# Patient Record
Sex: Male | Born: 1955 | Race: Black or African American | Hispanic: No | Marital: Single | Smoking: Never smoker
Health system: Southern US, Community
[De-identification: ages and names within clinical notes are randomized; demographics above are authoritative.]

## PROBLEM LIST (undated history)

## (undated) DIAGNOSIS — I10 Essential (primary) hypertension: Secondary | ICD-10-CM

## (undated) DIAGNOSIS — N183 Chronic kidney disease, stage 3 unspecified: Secondary | ICD-10-CM

## (undated) DIAGNOSIS — D649 Anemia, unspecified: Secondary | ICD-10-CM

## (undated) DIAGNOSIS — R112 Nausea with vomiting, unspecified: Secondary | ICD-10-CM

## (undated) DIAGNOSIS — Z9889 Other specified postprocedural states: Secondary | ICD-10-CM

## (undated) DIAGNOSIS — C49A0 Gastrointestinal stromal tumor, unspecified site: Secondary | ICD-10-CM

## (undated) DIAGNOSIS — N022 Recurrent and persistent hematuria with diffuse membranous glomerulonephritis: Secondary | ICD-10-CM

## (undated) HISTORY — DX: Essential (primary) hypertension: I10

## (undated) HISTORY — DX: Recurrent and persistent hematuria with diffuse membranous glomerulonephritis: N02.2

## (undated) HISTORY — PX: SURGICAL EXCISION OF EXCESSIVE SKIN: SHX6542

## (undated) HISTORY — PX: COLONOSCOPY: SHX174

---
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
## 1998-08-15 ENCOUNTER — Emergency Department (HOSPITAL_COMMUNITY): Admission: EM | Admit: 1998-08-15 | Discharge: 1998-08-15 | Payer: Self-pay | Admitting: Emergency Medicine

## 2000-11-12 ENCOUNTER — Encounter: Payer: Self-pay | Admitting: Internal Medicine

## 2000-11-12 ENCOUNTER — Encounter: Admission: RE | Admit: 2000-11-12 | Discharge: 2000-11-12 | Payer: Self-pay | Admitting: Internal Medicine

## 2009-01-15 ENCOUNTER — Ambulatory Visit: Payer: Self-pay | Admitting: Internal Medicine

## 2009-08-20 ENCOUNTER — Ambulatory Visit: Payer: Self-pay | Admitting: Internal Medicine

## 2010-02-25 ENCOUNTER — Ambulatory Visit: Payer: Self-pay | Admitting: Internal Medicine

## 2010-03-17 ENCOUNTER — Ambulatory Visit: Payer: Self-pay | Admitting: Internal Medicine

## 2010-05-12 ENCOUNTER — Ambulatory Visit
Admission: RE | Admit: 2010-05-12 | Discharge: 2010-05-12 | Payer: Self-pay | Source: Home / Self Care | Attending: Internal Medicine | Admitting: Internal Medicine

## 2010-06-14 ENCOUNTER — Ambulatory Visit
Admission: RE | Admit: 2010-06-14 | Discharge: 2010-06-14 | Payer: Self-pay | Source: Home / Self Care | Attending: Internal Medicine | Admitting: Internal Medicine

## 2010-12-09 ENCOUNTER — Encounter: Payer: Self-pay | Admitting: Internal Medicine

## 2010-12-13 ENCOUNTER — Ambulatory Visit: Payer: Self-pay | Admitting: Internal Medicine

## 2011-01-25 ENCOUNTER — Other Ambulatory Visit: Payer: Self-pay | Admitting: Internal Medicine

## 2011-05-26 ENCOUNTER — Other Ambulatory Visit: Payer: Self-pay | Admitting: Internal Medicine

## 2011-12-05 ENCOUNTER — Other Ambulatory Visit: Payer: Self-pay | Admitting: Internal Medicine

## 2011-12-05 NOTE — Telephone Encounter (Signed)
Check last appt. Generally do just 6 month recheck and not a PE because of poor insurance coverage. Can do 30 days with one refill if no recent appt.

## 2012-01-28 ENCOUNTER — Other Ambulatory Visit: Payer: Self-pay | Admitting: Internal Medicine

## 2012-03-04 ENCOUNTER — Other Ambulatory Visit: Payer: Self-pay | Admitting: Internal Medicine

## 2012-03-04 NOTE — Telephone Encounter (Signed)
No appointment scheduled

## 2012-03-04 NOTE — Telephone Encounter (Signed)
?   OV soon?

## 2012-03-05 ENCOUNTER — Other Ambulatory Visit: Payer: Self-pay

## 2012-03-05 MED ORDER — LISINOPRIL-HYDROCHLOROTHIAZIDE 20-25 MG PO TABS: 1.0000 | ORAL_TABLET | Freq: Every day | ORAL | Status: DC

## 2012-03-14 ENCOUNTER — Encounter: Payer: Self-pay | Admitting: Internal Medicine

## 2012-03-14 ENCOUNTER — Ambulatory Visit (INDEPENDENT_AMBULATORY_CARE_PROVIDER_SITE_OTHER): Payer: Self-pay | Admitting: Internal Medicine

## 2012-03-14 VITALS — BP 134/90 | HR 76 | Temp 98.3°F | Wt 252.0 lb

## 2012-03-14 DIAGNOSIS — I10 Essential (primary) hypertension: Secondary | ICD-10-CM

## 2012-03-14 NOTE — Progress Notes (Signed)
  Subjective:    Patient ID: Larry Clements, male    DOB: 04/16/1956, 56 y.o.   MRN: [id number]  HPI 56 year old 56 male in today for recheck of hypertension. No complaints or problems. Does not usually take influenza vaccine in never gets the flu. Compliant with blood pressure medication. Blood pressure under good control on current regimen. No chest pain. No shortness of breath. 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 Agrees to come next week for fasting lab work.    Review of Systems     Objective:   Physical Exam neck is supple without JVD thyromegaly or carotid bruits. Chest clear to auscultation. Cardiac exam regular rate and rhythm normal S1 and S2. Shampoos without edema.         Assessment & Plan:  Hypertension  Hypertension  hypertension hypertension this will    Hypertension-reasonably good control on current regimen  Plan: Return for fasting lab work next week along with Tdap vaccine to be given at that time. Declines influenza immunization. Okay to refill antihypertensive medication for 6 months.

## 2012-03-14 NOTE — Patient Instructions (Addendum)
Return next week for fasting lab work and tetanus immunization update. Otherwise return in 6 months.

## 2012-03-21 ENCOUNTER — Other Ambulatory Visit: Payer: BC Managed Care – PPO | Admitting: Internal Medicine

## 2012-03-21 DIAGNOSIS — Z125 Encounter for screening for malignant neoplasm of prostate: Secondary | ICD-10-CM

## 2012-03-21 DIAGNOSIS — I10 Essential (primary) hypertension: Secondary | ICD-10-CM

## 2012-03-21 DIAGNOSIS — E785 Hyperlipidemia, unspecified: Secondary | ICD-10-CM

## 2012-03-21 LAB — COMPREHENSIVE METABOLIC PANEL
Albumin: 4.1 g/dL (ref 3.5–5.2)
Alkaline Phosphatase: 68 U/L (ref 39–117)
BUN: 24 mg/dL — ABNORMAL HIGH (ref 6–23)
CO2: 29 mEq/L (ref 19–32)
Glucose, Bld: 80 mg/dL (ref 70–99)
Sodium: 140 mEq/L (ref 135–145)
Total Bilirubin: 0.8 mg/dL (ref 0.3–1.2)
Total Protein: 7.3 g/dL (ref 6.0–8.3)

## 2012-03-21 LAB — CBC WITH DIFFERENTIAL/PLATELET
Basophils Relative: 1 % (ref 0–1)
Eosinophils Absolute: 0.3 10*3/uL (ref 0.0–0.7)
HCT: 40.4 % (ref 39.0–52.0)
Hemoglobin: 13.7 g/dL (ref 13.0–17.0)
Lymphs Abs: 1 10*3/uL (ref 0.7–4.0)
MCH: 27.6 pg (ref 26.0–34.0)
MCHC: 33.9 g/dL (ref 30.0–36.0)
MCV: 81.5 fL (ref 78.0–100.0)
Monocytes Absolute: 0.4 10*3/uL (ref 0.1–1.0)
Monocytes Relative: 10 % (ref 3–12)
Neutrophils Relative %: 58 % (ref 43–77)
RBC: 4.96 MIL/uL (ref 4.22–5.81)

## 2012-03-21 LAB — LIPID PANEL
Cholesterol: 138 mg/dL (ref 0–200)
HDL: 37 mg/dL — ABNORMAL LOW (ref >39)
Triglycerides: 90 mg/dL (ref <150)
VLDL: 18 mg/dL (ref 0–40)

## 2012-06-02 ENCOUNTER — Other Ambulatory Visit: Payer: Self-pay | Admitting: Internal Medicine

## 2012-07-04 ENCOUNTER — Other Ambulatory Visit: Payer: Self-pay | Admitting: Internal Medicine

## 2012-10-06 ENCOUNTER — Other Ambulatory Visit: Payer: Self-pay | Admitting: Internal Medicine

## 2012-11-11 ENCOUNTER — Telehealth: Payer: Self-pay | Admitting: Internal Medicine

## 2012-11-11 MED ORDER — LISINOPRIL-HYDROCHLOROTHIAZIDE 20-25 MG PO TABS: 1.0000 | ORAL_TABLET | Freq: Every day | ORAL | Status: DC

## 2012-11-11 NOTE — Telephone Encounter (Signed)
Needs PE October. Refill through October

## 2012-11-12 NOTE — Telephone Encounter (Signed)
Patient scheduled for CPE Labs 11/6 and CPE on 11/7 @ 1500.  Spoke with patient and he understands meds will be refilled until this appointment and then if appointment not kept, no refills.

## 2012-12-10 ENCOUNTER — Other Ambulatory Visit: Payer: Self-pay

## 2012-12-11 ENCOUNTER — Other Ambulatory Visit: Payer: Self-pay

## 2012-12-11 MED ORDER — LISINOPRIL-HYDROCHLOROTHIAZIDE 20-25 MG PO TABS: 1.0000 | ORAL_TABLET | Freq: Every day | ORAL | Status: DC

## 2012-12-12 ENCOUNTER — Other Ambulatory Visit: Payer: Self-pay

## 2013-03-12 ENCOUNTER — Other Ambulatory Visit: Payer: Self-pay | Admitting: Internal Medicine

## 2013-03-20 ENCOUNTER — Other Ambulatory Visit: Payer: BC Managed Care – PPO | Admitting: Internal Medicine

## 2013-03-20 DIAGNOSIS — Z0000 Encounter for general adult medical examination without abnormal findings: Secondary | ICD-10-CM

## 2013-03-20 DIAGNOSIS — Z125 Encounter for screening for malignant neoplasm of prostate: Secondary | ICD-10-CM

## 2013-03-20 DIAGNOSIS — E785 Hyperlipidemia, unspecified: Secondary | ICD-10-CM

## 2013-03-20 DIAGNOSIS — Z130 Encounter for screening for diseases of the blood and blood-forming organs and certain disorders involving the immune mechanism: Secondary | ICD-10-CM

## 2013-03-20 LAB — CBC WITH DIFFERENTIAL/PLATELET
Basophils Absolute: 0 10*3/uL (ref 0.0–0.1)
Eosinophils Absolute: 0.2 10*3/uL (ref 0.0–0.7)
Eosinophils Relative: 5 % (ref 0–5)
HCT: 41 % (ref 39.0–52.0)
Hemoglobin: 14 g/dL (ref 13.0–17.0)
Lymphocytes Relative: 26 % (ref 12–46)
Lymphs Abs: 1.1 10*3/uL (ref 0.7–4.0)
MCH: 27.9 pg (ref 26.0–34.0)
MCV: 81.8 fL (ref 78.0–100.0)
Monocytes Absolute: 0.5 10*3/uL (ref 0.1–1.0)
Monocytes Relative: 10 % (ref 3–12)
Platelets: 277 10*3/uL (ref 150–400)
RBC: 5.01 MIL/uL (ref 4.22–5.81)
RDW: 13.3 % (ref 11.5–15.5)
WBC: 4.4 10*3/uL (ref 4.0–10.5)

## 2013-03-20 LAB — LIPID PANEL
Cholesterol: 135 mg/dL (ref 0–200)
Total CHOL/HDL Ratio: 3.4 Ratio

## 2013-03-20 LAB — COMPREHENSIVE METABOLIC PANEL WITH GFR
ALT: 46 U/L (ref 0–53)
AST: 43 U/L — ABNORMAL HIGH (ref 0–37)
Albumin: 4 g/dL (ref 3.5–5.2)
Alkaline Phosphatase: 65 U/L (ref 39–117)
BUN: 29 mg/dL — ABNORMAL HIGH (ref 6–23)
CO2: 29 meq/L (ref 19–32)
Calcium: 9.7 mg/dL (ref 8.4–10.5)
Chloride: 102 meq/L (ref 96–112)
Creat: 1.61 mg/dL — ABNORMAL HIGH (ref 0.50–1.35)
Glucose, Bld: 93 mg/dL (ref 70–99)
Potassium: 3.4 meq/L — ABNORMAL LOW (ref 3.5–5.3)
Sodium: 139 meq/L (ref 135–145)
Total Bilirubin: 0.8 mg/dL (ref 0.3–1.2)
Total Protein: 7.2 g/dL (ref 6.0–8.3)

## 2013-03-21 ENCOUNTER — Encounter: Payer: BC Managed Care – PPO | Admitting: Internal Medicine

## 2013-03-21 ENCOUNTER — Telehealth: Payer: Self-pay | Admitting: Internal Medicine

## 2013-03-21 NOTE — Telephone Encounter (Signed)
Has a copy of his card in his "other" car.  R/S CPE for 05/16/2013 @ 3pm.  Patient instructed to bring copy of his insurance card with him to appointment.  Until card is in hand and electronically verified we cannot do a CPE on this patient.  He hasn't had an insurance card on file for a # of years.  He originally had [employer] through [place], but now states that it is [employer] but again, it did NOT verify.

## 2013-05-16 ENCOUNTER — Encounter: Payer: Self-pay | Admitting: Internal Medicine

## 2013-05-16 ENCOUNTER — Ambulatory Visit (INDEPENDENT_AMBULATORY_CARE_PROVIDER_SITE_OTHER): Payer: PRIVATE HEALTH INSURANCE | Admitting: Internal Medicine

## 2013-05-16 VITALS — BP 138/90 | HR 72 | Temp 98.2°F | Ht 76.0 in | Wt 254.0 lb

## 2013-05-16 DIAGNOSIS — I10 Essential (primary) hypertension: Secondary | ICD-10-CM

## 2013-05-16 DIAGNOSIS — E876 Hypokalemia: Secondary | ICD-10-CM

## 2013-05-16 DIAGNOSIS — Z0000 Encounter for general adult medical examination without abnormal findings: Secondary | ICD-10-CM

## 2013-05-16 DIAGNOSIS — Z23 Encounter for immunization: Secondary | ICD-10-CM

## 2013-05-16 LAB — POCT URINALYSIS DIPSTICK
Bilirubin, UA: NEGATIVE
Glucose, UA: NEGATIVE
Ketones, UA: NEGATIVE
LEUKOCYTES UA: NEGATIVE
Nitrite, UA: NEGATIVE
RBC UA: NEGATIVE
Spec Grav, UA: 1.02
Urobilinogen, UA: NEGATIVE
pH, UA: 6

## 2013-05-16 MED ORDER — POTASSIUM CHLORIDE ER 10 MEQ PO TBCR: 10.0000 meq | EXTENDED_RELEASE_TABLET | Freq: Every day | ORAL | Status: DC

## 2013-05-16 MED ORDER — TETANUS-DIPHTH-ACELL PERTUSSIS 5-2.5-18.5 LF-MCG/0.5 IM SUSP: 0.5000 mL | Freq: Once | INTRAMUSCULAR | Status: DC

## 2013-05-16 MED ORDER — LISINOPRIL-HYDROCHLOROTHIAZIDE 20-25 MG PO TABS: ORAL_TABLET | ORAL | Status: DC

## 2013-05-16 MED ORDER — AMLODIPINE BESYLATE 10 MG PO TABS: ORAL_TABLET | ORAL | Status: DC

## 2013-05-24 ENCOUNTER — Other Ambulatory Visit: Payer: Self-pay | Admitting: Internal Medicine

## 2013-05-30 ENCOUNTER — Other Ambulatory Visit: Payer: PRIVATE HEALTH INSURANCE | Admitting: Internal Medicine

## 2013-05-30 DIAGNOSIS — E876 Hypokalemia: Secondary | ICD-10-CM

## 2013-05-30 LAB — POTASSIUM: Potassium: 3.7 mEq/L (ref 3.5–5.3)

## 2013-06-02 ENCOUNTER — Other Ambulatory Visit: Payer: Self-pay

## 2013-06-02 MED ORDER — POTASSIUM CHLORIDE CRYS ER 20 MEQ PO TBCR: 20.0000 meq | EXTENDED_RELEASE_TABLET | Freq: Every day | ORAL | Status: DC

## 2013-06-02 NOTE — Progress Notes (Signed)
Patient informed. States he is taking K+ 73meq daily. Per Dr. Renold Genta, increase it to 48meq daily and recheck K+ in 2 weeks.

## 2013-06-15 ENCOUNTER — Encounter: Payer: Self-pay | Admitting: Internal Medicine

## 2013-06-15 DIAGNOSIS — E876 Hypokalemia: Secondary | ICD-10-CM | POA: Insufficient documentation

## 2013-06-15 NOTE — Patient Instructions (Signed)
Take potassium supplement 10 mEq daily in addition to antihypertensive medication and return in 2 weeks for potassium check. Otherwise return in 6 months.

## 2013-06-15 NOTE — Progress Notes (Signed)
   Subjective:    Patient ID: Larry Clements, male    DOB: [birth year], 58 y.o.   MRN: [id number]  HPI  58 year old white male in today for health maintenance exam. History of hypertension. Currently treated with amlodipine and lisinopril HCTZ. Does not take influenza vaccine. No new issues. No known drug allergies.  Social history: He is divorced. Has 2 adult children. Does not smoke or consume alcohol. 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  Family history: Father died at 81 with renal failure with history of diabetes. Mother with history of hypertension. 2 brothers healthy. 2 sisters healthy.  History of ganglion right wrist 1998.  No history of serious illnesses, accidents, or operations    Review of Systems  Constitutional: Negative.   All other systems reviewed and are negative.       Objective:   Physical Exam  Vitals reviewed. Constitutional: He is oriented to person, place, and time. He appears well-developed and well-nourished. No distress.  HENT:  Head: Normocephalic and atraumatic.  Right Ear: External ear normal.  Left Ear: External ear normal.  Mouth/Throat: Oropharynx is clear and moist.  Eyes: Conjunctivae and EOM are normal. Pupils are equal, round, and reactive to light. Right eye exhibits no discharge. Left eye exhibits no discharge. No scleral icterus.  Neck: Neck supple. No JVD present. No thyromegaly present.  Cardiovascular: Normal rate, regular rhythm, normal heart sounds and intact distal pulses.   No murmur heard. Pulmonary/Chest: Effort normal and breath sounds normal. He has no wheezes. He has no rales.  Abdominal: Soft. Bowel sounds are normal. He exhibits no distension and no mass. There is no rebound and no guarding.  Genitourinary: Prostate normal.  Musculoskeletal: Normal range of motion. He exhibits no edema.  Lymphadenopathy:    He has no cervical adenopathy.  Neurological: He is alert and oriented to person, place, and time. He has  normal reflexes. No cranial nerve deficit. Coordination normal.  Skin: Skin is warm and dry. No rash noted. He is not diaphoretic.  Psychiatric: He has a normal mood and affect. His behavior is normal. Judgment and thought content normal.          Assessment & Plan:  Essential hypertension  Mild hypokalemia secondary to diuretic therapy based on lab work done November 2014.  Plan: Would like this potassium to be 4.0 or better. Prescribed potassium supplement 10 mEq daily repeat potassium in 2 weeks  Addendum: 05/30/2013. Potassium improved slightly at 3.7. Have nurse inquire if the patient is taking potassium 10 mEq on a regular basis. Not he needs to do so and return in 2 weeks. If so, he needs to double to 20 mEq daily return in 2 weeks. Want this potassium to be 4.0 or better. Otherwise return in 6 months for blood pressure check. Declines influenza immunization. Colonoscopy discussed.

## 2013-06-17 ENCOUNTER — Other Ambulatory Visit: Payer: BC Managed Care – PPO | Admitting: Internal Medicine

## 2013-06-17 ENCOUNTER — Other Ambulatory Visit: Payer: Self-pay

## 2013-06-17 DIAGNOSIS — E876 Hypokalemia: Secondary | ICD-10-CM

## 2013-06-17 LAB — POTASSIUM: POTASSIUM: 3.7 meq/L (ref 3.5–5.3)

## 2013-06-17 MED ORDER — LISINOPRIL-HYDROCHLOROTHIAZIDE 20-25 MG PO TABS: ORAL_TABLET | ORAL | Status: DC

## 2013-06-18 ENCOUNTER — Other Ambulatory Visit: Payer: Self-pay

## 2013-06-18 MED ORDER — POTASSIUM CHLORIDE CRYS ER 20 MEQ PO TBCR: 20.0000 meq | EXTENDED_RELEASE_TABLET | Freq: Two times a day (BID) | ORAL | Status: DC

## 2013-07-04 ENCOUNTER — Other Ambulatory Visit: Payer: BC Managed Care – PPO | Admitting: Internal Medicine

## 2013-07-04 DIAGNOSIS — E876 Hypokalemia: Secondary | ICD-10-CM

## 2013-07-04 LAB — POTASSIUM: Potassium: 3.8 mEq/L (ref 3.5–5.3)

## 2013-07-25 ENCOUNTER — Other Ambulatory Visit: Payer: BC Managed Care – PPO | Admitting: Internal Medicine

## 2013-07-28 ENCOUNTER — Other Ambulatory Visit: Payer: BC Managed Care – PPO | Admitting: Internal Medicine

## 2013-07-28 DIAGNOSIS — E876 Hypokalemia: Secondary | ICD-10-CM

## 2013-07-28 LAB — POTASSIUM: Potassium: 3.9 mEq/L (ref 3.5–5.3)

## 2013-07-30 ENCOUNTER — Other Ambulatory Visit: Payer: Self-pay

## 2013-07-30 NOTE — Progress Notes (Signed)
Addendum. Patient has picked up K-dur last evening and is taking them now. Will return in 2 weeks for K+ RECHECK.

## 2013-07-30 NOTE — Progress Notes (Signed)
Patient states he has not been taking his potassium supplement due to a miscommunication between pharmacy and his insurance co. Spoke with [facility] and they said they would call to get an overide for this Rx.

## 2013-08-14 ENCOUNTER — Other Ambulatory Visit: Payer: BC Managed Care – PPO | Admitting: Internal Medicine

## 2013-08-18 ENCOUNTER — Other Ambulatory Visit: Payer: BC Managed Care – PPO | Admitting: Internal Medicine

## 2013-08-18 DIAGNOSIS — E876 Hypokalemia: Secondary | ICD-10-CM

## 2013-08-18 LAB — POTASSIUM: POTASSIUM: 3.7 meq/L (ref 3.5–5.3)

## 2013-08-20 ENCOUNTER — Other Ambulatory Visit: Payer: Self-pay

## 2013-08-20 NOTE — Progress Notes (Signed)
Patient picked up KDur 21meq at end of March, and is taking 2 po daily, both in the am.

## 2013-08-21 ENCOUNTER — Telehealth: Payer: Self-pay

## 2013-08-21 NOTE — Progress Notes (Signed)
Patient to continue KDur 82meq 2 po daily and will recheck potassium at his next OV.

## 2013-11-30 ENCOUNTER — Other Ambulatory Visit: Payer: Self-pay | Admitting: Internal Medicine

## 2013-12-18 ENCOUNTER — Other Ambulatory Visit: Payer: BC Managed Care – PPO | Admitting: Internal Medicine

## 2013-12-18 DIAGNOSIS — E876 Hypokalemia: Secondary | ICD-10-CM

## 2013-12-19 LAB — POTASSIUM: Potassium: 3.8 mEq/L (ref 3.5–5.3)

## 2013-12-25 ENCOUNTER — Other Ambulatory Visit: Payer: BC Managed Care – PPO | Admitting: Internal Medicine

## 2013-12-25 DIAGNOSIS — Z202 Contact with and (suspected) exposure to infections with a predominantly sexual mode of transmission: Secondary | ICD-10-CM

## 2013-12-26 LAB — GC/CHLAMYDIA PROBE AMP, URINE
Chlamydia, Swab/Urine, PCR: NEGATIVE
GC Probe Amp, Urine: NEGATIVE

## 2013-12-26 LAB — HEPATITIS C ANTIBODY: HCV Ab: NEGATIVE

## 2013-12-26 LAB — HIV ANTIBODY (ROUTINE TESTING W REFLEX): HIV 1&2 Ab, 4th Generation: NONREACTIVE

## 2013-12-29 LAB — HSV(HERPES SMPLX)ABS-I+II(IGG+IGM)-BLD
HSV 1 Glycoprotein G Ab, IgG: 5.77 IV — ABNORMAL HIGH
HSV 2 GLYCOPROTEIN G AB, IGG: 3.19 IV — AB
Herpes Simplex Vrs I&II-IgM Ab (EIA): 0.98 INDEX

## 2014-01-27 ENCOUNTER — Other Ambulatory Visit: Payer: Self-pay | Admitting: Internal Medicine

## 2014-02-23 ENCOUNTER — Ambulatory Visit (INDEPENDENT_AMBULATORY_CARE_PROVIDER_SITE_OTHER): Payer: BC Managed Care – PPO | Admitting: Internal Medicine

## 2014-02-23 ENCOUNTER — Encounter: Payer: Self-pay | Admitting: Internal Medicine

## 2014-02-23 VITALS — BP 158/82 | HR 82 | Temp 98.3°F | Ht 76.0 in | Wt 264.0 lb

## 2014-02-23 DIAGNOSIS — B009 Herpesviral infection, unspecified: Secondary | ICD-10-CM

## 2014-02-23 DIAGNOSIS — Z8619 Personal history of other infectious and parasitic diseases: Secondary | ICD-10-CM

## 2014-02-23 DIAGNOSIS — I10 Essential (primary) hypertension: Secondary | ICD-10-CM

## 2014-02-23 DIAGNOSIS — E876 Hypokalemia: Secondary | ICD-10-CM

## 2014-02-25 LAB — HSV(HERPES SMPLX)ABS-I+II(IGG+IGM)-BLD
HERPES SIMPLEX VRS I-IGM AB (EIA): 1.13 {index} — AB
HSV 1 Glycoprotein G Ab, IgG: 6.15 IV — ABNORMAL HIGH
HSV 2 Glycoprotein G Ab, IgG: 5.53 IV — ABNORMAL HIGH

## 2014-03-03 ENCOUNTER — Other Ambulatory Visit: Payer: Self-pay | Admitting: Internal Medicine

## 2014-04-12 ENCOUNTER — Encounter: Payer: Self-pay | Admitting: Internal Medicine

## 2014-04-12 NOTE — Patient Instructions (Signed)
Continue follow-up for hypertension every 6 months.

## 2014-04-12 NOTE — Progress Notes (Signed)
   Subjective:    Patient ID: Larry Clements, male    DOB: 1955-11-01, 58 y.o.   MRN: [id number]  HPI  Patient wants to have blood test for herpes simplex repeated. It has been positive in the past. Denies outbreaks. His blood pressure is elevated today. Says he just got off work at [employer] and is fatigued.    Review of Systems     Objective:   Physical Exam   Spent 10 minutes talking with patient about his concerns     Assessment & Plan:  Positive herpes simplex titer  Hypertension  Hypokalemia related to diuretic therapy  Plan: Return for blood pressure follow-up every 6 months.

## 2014-05-28 ENCOUNTER — Other Ambulatory Visit: Payer: Self-pay | Admitting: Internal Medicine

## 2014-08-21 ENCOUNTER — Other Ambulatory Visit: Payer: Self-pay | Admitting: Internal Medicine

## 2014-08-21 NOTE — Telephone Encounter (Signed)
CPE was due January Refill and book CPE in next 8 weeks.

## 2014-08-24 NOTE — Telephone Encounter (Signed)
CPE scheduled  

## 2014-09-19 ENCOUNTER — Other Ambulatory Visit: Payer: Self-pay | Admitting: Internal Medicine

## 2014-10-09 ENCOUNTER — Other Ambulatory Visit: Payer: Self-pay | Admitting: Internal Medicine

## 2014-10-13 ENCOUNTER — Other Ambulatory Visit: Payer: PRIVATE HEALTH INSURANCE | Admitting: Internal Medicine

## 2014-10-13 DIAGNOSIS — Z79899 Other long term (current) drug therapy: Secondary | ICD-10-CM

## 2014-10-13 LAB — POTASSIUM: POTASSIUM: 3.8 meq/L (ref 3.5–5.3)

## 2014-10-13 MED ORDER — AMLODIPINE BESYLATE 10 MG PO TABS: 10.0000 mg | ORAL_TABLET | Freq: Every day | ORAL | Status: DC

## 2014-10-26 ENCOUNTER — Other Ambulatory Visit: Payer: PRIVATE HEALTH INSURANCE | Admitting: Internal Medicine

## 2014-10-26 DIAGNOSIS — Z13220 Encounter for screening for lipoid disorders: Secondary | ICD-10-CM

## 2014-10-26 DIAGNOSIS — Z125 Encounter for screening for malignant neoplasm of prostate: Secondary | ICD-10-CM

## 2014-10-26 DIAGNOSIS — Z0000 Encounter for general adult medical examination without abnormal findings: Secondary | ICD-10-CM

## 2014-10-26 LAB — CBC WITH DIFFERENTIAL/PLATELET
Basophils Absolute: 0 10*3/uL (ref 0.0–0.1)
Basophils Relative: 1 % (ref 0–1)
EOS PCT: 4 % (ref 0–5)
Eosinophils Absolute: 0.2 10*3/uL (ref 0.0–0.7)
HCT: 39.4 % (ref 39.0–52.0)
HEMOGLOBIN: 12.6 g/dL — AB (ref 13.0–17.0)
LYMPHS ABS: 0.9 10*3/uL (ref 0.7–4.0)
LYMPHS PCT: 20 % (ref 12–46)
MCH: 27.7 pg (ref 26.0–34.0)
MCHC: 32 g/dL (ref 30.0–36.0)
MCV: 86.6 fL (ref 78.0–100.0)
MPV: 9.8 fL (ref 8.6–12.4)
Monocytes Absolute: 0.5 10*3/uL (ref 0.1–1.0)
Monocytes Relative: 12 % (ref 3–12)
Neutro Abs: 2.7 10*3/uL (ref 1.7–7.7)
Neutrophils Relative %: 63 % (ref 43–77)
Platelets: 288 10*3/uL (ref 150–400)
RBC: 4.55 MIL/uL (ref 4.22–5.81)
RDW: 13.7 % (ref 11.5–15.5)
WBC: 4.3 10*3/uL (ref 4.0–10.5)

## 2014-10-26 LAB — COMPLETE METABOLIC PANEL WITH GFR
ALT: 27 U/L (ref 0–53)
AST: 24 U/L (ref 0–37)
Albumin: 4.1 g/dL (ref 3.5–5.2)
Alkaline Phosphatase: 69 U/L (ref 39–117)
BILIRUBIN TOTAL: 0.6 mg/dL (ref 0.2–1.2)
BUN: 21 mg/dL (ref 6–23)
CO2: 25 meq/L (ref 19–32)
CREATININE: 1.57 mg/dL — AB (ref 0.50–1.35)
Calcium: 9.5 mg/dL (ref 8.4–10.5)
Chloride: 104 mEq/L (ref 96–112)
GFR, EST AFRICAN AMERICAN: 56 mL/min — AB
GFR, EST NON AFRICAN AMERICAN: 48 mL/min — AB
Glucose, Bld: 94 mg/dL (ref 70–99)
Potassium: 3.9 mEq/L (ref 3.5–5.3)
Sodium: 139 mEq/L (ref 135–145)
Total Protein: 7.3 g/dL (ref 6.0–8.3)

## 2014-10-26 LAB — LIPID PANEL
CHOL/HDL RATIO: 3.5 ratio
CHOLESTEROL: 157 mg/dL (ref 0–200)
HDL: 45 mg/dL (ref >=40)
LDL CALC: 94 mg/dL (ref 0–99)
TRIGLYCERIDES: 92 mg/dL (ref <150)
VLDL: 18 mg/dL (ref 0–40)

## 2014-10-27 LAB — PSA: PSA: 3.48 ng/mL (ref <=4.00)

## 2014-11-05 ENCOUNTER — Ambulatory Visit (INDEPENDENT_AMBULATORY_CARE_PROVIDER_SITE_OTHER): Payer: PRIVATE HEALTH INSURANCE | Admitting: Internal Medicine

## 2014-11-05 ENCOUNTER — Encounter: Payer: Self-pay | Admitting: Internal Medicine

## 2014-11-05 VITALS — BP 140/80 | HR 77 | Temp 98.2°F | Ht 76.0 in | Wt 245.5 lb

## 2014-11-05 DIAGNOSIS — D649 Anemia, unspecified: Secondary | ICD-10-CM

## 2014-11-05 DIAGNOSIS — Z0000 Encounter for general adult medical examination without abnormal findings: Secondary | ICD-10-CM | POA: Diagnosis not present

## 2014-11-05 DIAGNOSIS — I10 Essential (primary) hypertension: Secondary | ICD-10-CM | POA: Diagnosis not present

## 2014-11-05 LAB — FOLATE: Folate: 16.3 ng/mL

## 2014-11-05 LAB — IRON AND TIBC
%SAT: 39 % (ref 20–55)
Iron: 128 ug/dL (ref 42–165)
TIBC: 332 ug/dL (ref 215–435)
UIBC: 204 ug/dL (ref 125–400)

## 2014-11-05 LAB — VITAMIN B12: Vitamin B-12: 729 pg/mL (ref 211–911)

## 2014-11-08 ENCOUNTER — Encounter: Payer: Self-pay | Admitting: Internal Medicine

## 2014-11-08 NOTE — Progress Notes (Signed)
   Subjective:    Patient ID: Larry Clements, male    DOB: 1956-01-15, 59 y.o.   MRN: [id number]  HPI  59 year old Black Male in today for health maintenance exam. Has a history of hypertension and is on a 3 drug regimen. History of hypokalemia related to diuretic therapy. He is on potassium supplementation.  No known drug allergies  No history of serious illnesses or accidents. History of ganglion cyst right wrist 1998  Family history: Father died at age 40 with renal failure and history of diabetes. Mother with history of hypertension deceased. 2 brothers healthy. 2 sisters healthy.  Social history: He is divorced. Has 2 adult children does not smoke or consume alcohol. 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    Review of Systems  Constitutional: Positive for fatigue.  HENT: Negative.   Eyes: Negative.   Respiratory: Negative.   Cardiovascular: Negative for chest pain, palpitations and leg swelling.  Gastrointestinal: Negative.   Endocrine: Negative.   Genitourinary: Negative.   Neurological: Negative.   Hematological: Negative.   Psychiatric/Behavioral: Negative.        Objective:   Physical Exam  Constitutional: He is oriented to person, place, and time. He appears well-developed and well-nourished. No distress.  HENT:  Head: Normocephalic and atraumatic.  Right Ear: External ear normal.  Left Ear: External ear normal.  Mouth/Throat: Oropharynx is clear and moist. No oropharyngeal exudate.  Eyes: Conjunctivae and EOM are normal. Pupils are equal, round, and reactive to light. Right eye exhibits no discharge. Left eye exhibits no discharge. No scleral icterus.  Neck: Neck supple. No JVD present. No thyromegaly present.  Cardiovascular: Normal rate, regular rhythm, normal heart sounds and intact distal pulses.   No murmur heard. Pulmonary/Chest: Effort normal and breath sounds normal. No respiratory distress. He has no wheezes. He has no rales. He exhibits no  tenderness.  Abdominal: Soft. Bowel sounds are normal. He exhibits no distension and no mass. There is no tenderness. There is no rebound and no guarding.  Genitourinary: Prostate normal.  Musculoskeletal: Normal range of motion. He exhibits no edema.  Lymphadenopathy:    He has no cervical adenopathy.  Neurological: He is alert and oriented to person, place, and time. He has normal reflexes. No cranial nerve deficit. Coordination normal.  Skin: Skin is warm and dry. No rash noted. He is not diaphoretic.  Psychiatric: He has a normal mood and affect. His behavior is normal. Judgment and thought content normal.  Vitals reviewed.         Assessment & Plan:  Essential hypertension  Hemoglobin 12.6 g. Which is slightly low for male Check iron and iron binding capacity, B-12 and folate levels.  Herpes simplex  History of hypokalemia. Would like potassium to be around 4.0. Continue potassium supplement.  Elevated serum creatinine. Patient was fasting. Repeat in 6 months.  Plan: Continue same regimen and return in 6 months. Needs screening colonoscopy.

## 2014-11-08 NOTE — Patient Instructions (Signed)
Continue potassium supplementation and 3 drug regimen for hypertension. Return in 6 months. Basic metabolic panel in 6 months. Needs screening colonoscopy.

## 2014-11-11 ENCOUNTER — Encounter: Payer: Self-pay | Admitting: Gastroenterology

## 2014-11-11 ENCOUNTER — Telehealth: Payer: Self-pay | Admitting: *Deleted

## 2014-11-11 DIAGNOSIS — Z0000 Encounter for general adult medical examination without abnormal findings: Secondary | ICD-10-CM

## 2014-11-11 NOTE — Telephone Encounter (Signed)
Reviewed lab results with patient . Patient referred to [facility] for screening colonoscopy.

## 2014-11-18 ENCOUNTER — Other Ambulatory Visit: Payer: Self-pay | Admitting: Internal Medicine

## 2014-12-24 ENCOUNTER — Other Ambulatory Visit: Payer: Self-pay | Admitting: Internal Medicine

## 2015-01-06 ENCOUNTER — Ambulatory Visit (AMBULATORY_SURGERY_CENTER): Payer: BLUE CROSS/BLUE SHIELD

## 2015-01-06 VITALS — Ht 76.0 in | Wt 244.0 lb

## 2015-01-06 DIAGNOSIS — Z1211 Encounter for screening for malignant neoplasm of colon: Secondary | ICD-10-CM

## 2015-01-06 MED ORDER — NA SULFATE-K SULFATE-MG SULF 17.5-3.13-1.6 GM/177ML PO SOLN: 1.0000 | Freq: Once | ORAL | Status: DC

## 2015-01-06 NOTE — Progress Notes (Signed)
No egg or soy allergy. No previous complications from anesthesia. No home O2. No diet meds. PV entered post pv (epic down)

## 2015-01-25 ENCOUNTER — Ambulatory Visit (AMBULATORY_SURGERY_CENTER): Payer: BLUE CROSS/BLUE SHIELD | Admitting: Gastroenterology

## 2015-01-25 ENCOUNTER — Encounter: Payer: Self-pay | Admitting: Gastroenterology

## 2015-01-25 VITALS — BP 147/75 | HR 64 | Temp 97.6°F | Resp 15 | Ht 76.0 in | Wt 244.0 lb

## 2015-01-25 DIAGNOSIS — Z1211 Encounter for screening for malignant neoplasm of colon: Secondary | ICD-10-CM

## 2015-01-25 MED ORDER — SODIUM CHLORIDE 0.9 % IV SOLN: 500.0000 mL | INTRAVENOUS | Status: DC

## 2015-01-25 NOTE — Progress Notes (Signed)
Report to PACU, RN, vss, BBS= Clear.  

## 2015-01-25 NOTE — Op Note (Signed)
[facility]  [address] [place], [address]   COLONOSCOPY PROCEDURE REPORT  PATIENT: Larry Clements, Larry Clements  MR#: [id number] BIRTHDATE: 1955-07-02 , 61  yrs. old GENDER: male ENDOSCOPIST: Ladene Artist, MD, [facility] REFERRED [id number] Parke Simmers, M.D. PROCEDURE DATE:  01/25/2015 PROCEDURE:   Colonoscopy, screening First Screening Colonoscopy - Avg.  risk and is 50 yrs.  old or older Yes.  Prior Negative Screening - Now for repeat screening. N/A  History of Adenoma - Now for follow-up colonoscopy & has been > or = to 3 yrs.  N/A  Polyps removed today? No Recommend repeat exam, <10 yrs? No ASA CLASS:   Class II INDICATIONS:Screening for colonic neoplasia and Colorectal Neoplasm Risk Assessment for this procedure is average risk. MEDICATIONS: Monitored anesthesia care and Propofol 300 mg IV DESCRIPTION OF PROCEDURE:   After the risks benefits and alternatives of the procedure were thoroughly explained, informed consent was obtained.  The digital rectal exam revealed no abnormalities of the rectum.   The LB [id number]-[id number]  endoscope was introduced through the anus and advanced to the cecum, which was identified by both the appendix and ileocecal valve. No adverse events experienced.   The quality of the prep was adequate after extensive rinsing and suctioning (Suprep was used)  The instrument was then slowly withdrawn as the colon was fully examined. Estimated blood loss is zero unless otherwise noted in this procedure report.    COLON FINDINGS: A normal appearing cecum, ileocecal valve, and appendiceal orifice were identified.  The ascending, transverse, descending, sigmoid colon, and rectum appeared unremarkable. Retroflexed views revealed no abnormalities. The time to cecum = 1.5 Withdrawal time = 19.3   The scope was withdrawn and the procedure completed. COMPLICATIONS: There were no immediate complications.  ENDOSCOPIC IMPRESSION: Normal  colonoscopy  RECOMMENDATIONS: Continue current colorectal screening recommendations for "routine risk" patients with a repeat colonoscopy in 10 years.  eSigned:  Ladene Artist, MD, [facility] 01/25/2015 10:51 AM

## 2015-01-25 NOTE — Patient Instructions (Signed)
YOU HAD AN ENDOSCOPIC PROCEDURE TODAY AT [facility]:   Refer to the procedure report that was given to you for any specific questions about what was found during the examination.  If the procedure report does not answer your questions, please call your gastroenterologist to clarify.  If you requested that your care partner not be given the details of your procedure findings, then the procedure report has been included in a sealed envelope for you to review at your convenience later.  YOU SHOULD EXPECT: Some feelings of bloating in the abdomen. Passage of more gas than usual.  Walking can help get rid of the air that was put into your GI tract during the procedure and reduce the bloating. If you had a lower endoscopy (such as a colonoscopy or flexible sigmoidoscopy) you may notice spotting of blood in your stool or on the toilet paper. If you underwent a bowel prep for your procedure, you may not have a normal bowel movement for a few days.  Please Note:  You might notice some irritation and congestion in your nose or some drainage.  This is from the oxygen used during your procedure.  There is no need for concern and it should clear up in a day or so.  SYMPTOMS TO REPORT IMMEDIATELY:   Following lower endoscopy (colonoscopy or flexible sigmoidoscopy):  Excessive amounts of blood in the stool  Significant tenderness or worsening of abdominal pains  Swelling of the abdomen that is new, acute  Fever of 100F or higher   For urgent or emergent issues, a gastroenterologist can be reached at any hour by calling [phone].   DIET: Your first meal following the procedure should be a small meal and then it is ok to progress to your normal diet. Heavy or fried foods are harder to digest and may make you feel nauseous or bloated.  Likewise, meals heavy in dairy and vegetables can increase bloating.  Drink plenty of fluids but you should avoid alcoholic beverages for 24  hours.  ACTIVITY:  You should plan to take it easy for the rest of today and you should NOT DRIVE or use heavy machinery until tomorrow (because of the sedation medicines used during the test).    FOLLOW UP: Our staff will call the number listed on your records the next business day following your procedure to check on you and address any questions or concerns that you may have regarding the information given to you following your procedure. If we do not reach you, we will leave a message.  However, if you are feeling well and you are not experiencing any problems, there is no need to return our call.  We will assume that you have returned to your regular daily activities without incident.  If any biopsies were taken you will be contacted by phone or by letter within the next 1-3 weeks.  Please call us at [phone] if you have not heard about the biopsies in 3 weeks.    SIGNATURES/CONFIDENTIALITY: You and/or your care partner have signed paperwork which will be entered into your electronic medical record.  These signatures attest to the fact that that the information above on your After Visit Summary has been reviewed and is understood.  Full responsibility of the confidentiality of this discharge information lies with you and/or your care-partner.  Normal Screening

## 2015-01-26 ENCOUNTER — Telehealth: Payer: Self-pay | Admitting: *Deleted

## 2015-01-26 NOTE — Telephone Encounter (Signed)
Message left

## 2015-02-08 ENCOUNTER — Other Ambulatory Visit: Payer: PRIVATE HEALTH INSURANCE | Admitting: Internal Medicine

## 2015-02-08 ENCOUNTER — Other Ambulatory Visit: Payer: Self-pay | Admitting: Internal Medicine

## 2015-02-08 DIAGNOSIS — Z0000 Encounter for general adult medical examination without abnormal findings: Secondary | ICD-10-CM

## 2015-02-08 DIAGNOSIS — Z79899 Other long term (current) drug therapy: Secondary | ICD-10-CM

## 2015-02-08 LAB — CBC WITH DIFFERENTIAL/PLATELET
BASOS PCT: 1 % (ref 0–1)
Basophils Absolute: 0.1 10*3/uL (ref 0.0–0.1)
Eosinophils Absolute: 0.2 10*3/uL (ref 0.0–0.7)
Eosinophils Relative: 3 % (ref 0–5)
HEMATOCRIT: 36.3 % — AB (ref 39.0–52.0)
HEMOGLOBIN: 11.6 g/dL — AB (ref 13.0–17.0)
LYMPHS ABS: 1.2 10*3/uL (ref 0.7–4.0)
LYMPHS PCT: 18 % (ref 12–46)
MCH: 27.2 pg (ref 26.0–34.0)
MCHC: 32 g/dL (ref 30.0–36.0)
MCV: 85.2 fL (ref 78.0–100.0)
MONO ABS: 0.5 10*3/uL (ref 0.1–1.0)
MONOS PCT: 8 % (ref 3–12)
MPV: 9.5 fL (ref 8.6–12.4)
NEUTROS ABS: 4.6 10*3/uL (ref 1.7–7.7)
NEUTROS PCT: 70 % (ref 43–77)
Platelets: 358 10*3/uL (ref 150–400)
RBC: 4.26 MIL/uL (ref 4.22–5.81)
RDW: 13.4 % (ref 11.5–15.5)
WBC: 6.6 10*3/uL (ref 4.0–10.5)

## 2015-02-08 LAB — BASIC METABOLIC PANEL
BUN: 20 mg/dL (ref 7–25)
CALCIUM: 9.3 mg/dL (ref 8.6–10.3)
CO2: 30 mmol/L (ref 20–31)
CREATININE: 1.63 mg/dL — AB (ref 0.70–1.33)
Chloride: 103 mmol/L (ref 98–110)
GLUCOSE: 96 mg/dL (ref 65–99)
Potassium: 3.8 mmol/L (ref 3.5–5.3)
Sodium: 137 mmol/L (ref 135–146)

## 2015-02-09 ENCOUNTER — Encounter: Payer: Self-pay | Admitting: Internal Medicine

## 2015-02-09 ENCOUNTER — Ambulatory Visit (INDEPENDENT_AMBULATORY_CARE_PROVIDER_SITE_OTHER): Payer: BLUE CROSS/BLUE SHIELD | Admitting: Internal Medicine

## 2015-02-09 VITALS — BP 142/84 | HR 86 | Temp 98.0°F | Ht 76.0 in | Wt 239.0 lb

## 2015-02-09 DIAGNOSIS — R748 Abnormal levels of other serum enzymes: Secondary | ICD-10-CM

## 2015-02-09 DIAGNOSIS — E876 Hypokalemia: Secondary | ICD-10-CM | POA: Diagnosis not present

## 2015-02-09 DIAGNOSIS — I10 Essential (primary) hypertension: Secondary | ICD-10-CM | POA: Diagnosis not present

## 2015-02-09 DIAGNOSIS — R7989 Other specified abnormal findings of blood chemistry: Secondary | ICD-10-CM

## 2015-02-09 MED ORDER — FUROSEMIDE 20 MG PO TABS: 20.0000 mg | ORAL_TABLET | Freq: Every day | ORAL | Status: DC

## 2015-02-09 NOTE — Patient Instructions (Signed)
Discontinue lisinopril HCTZ. Add Lasix 20 mg daily to amlodipine. Continue potassium supplement. Return in 3 weeks for office visit and basic metabolic panel.

## 2015-02-09 NOTE — Progress Notes (Signed)
   Subjective:    Patient ID: Larry Clements, male    DOB: [birth year], 59 y.o.   MRN: [id number]  HPI  In today for recheck on hypertension. We've been monitoring his serum creatinine. It is elevated at 1.63 and previously was 1.57  3 months ago. He is on lisinopril HCTZ and amlodipine. At this point, I'm going to stop lisinopril HCTZ. I do not for a amlodipine alone will be enough to control his blood pressure. I'm going to add Lasix 20 mg daily and have him follow-up with me in 3 weeks.  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 Looks a bit fatigued today.    Review of Systems     Objective:   Physical Exam Neck is supple without JVD thyromegaly or carotid bruits. Chest clear to auscultation. Cardiac exam regular rate and rhythm normal S1 and S2. Extremities without edema.       Assessment & Plan:  Essential hypertension  Elevated serum creatinine  Plan: Discontinue lisinopril HCTZ and return in 3 weeks. Will have basic metabolic panel at that time. Continue potassium supplement. Add Lasix 20 mg daily to amlodipine.

## 2015-02-10 LAB — ESTIMATED GFR
GFR, EST AFRICAN AMERICAN: 53 mL/min — AB (ref >=60)
GFR, Est Non African American: 46 mL/min — ABNORMAL LOW (ref >=60)

## 2015-02-10 LAB — CREATININE, SERUM: Creat: 1.63 mg/dL — ABNORMAL HIGH (ref 0.70–1.33)

## 2015-03-01 ENCOUNTER — Other Ambulatory Visit: Payer: PRIVATE HEALTH INSURANCE | Admitting: Internal Medicine

## 2015-03-02 ENCOUNTER — Encounter: Payer: Self-pay | Admitting: Internal Medicine

## 2015-03-02 ENCOUNTER — Ambulatory Visit (INDEPENDENT_AMBULATORY_CARE_PROVIDER_SITE_OTHER): Payer: BLUE CROSS/BLUE SHIELD | Admitting: Internal Medicine

## 2015-03-02 VITALS — BP 168/98 | HR 75 | Temp 97.6°F | Ht 76.0 in | Wt 248.0 lb

## 2015-03-02 DIAGNOSIS — E876 Hypokalemia: Secondary | ICD-10-CM

## 2015-03-02 DIAGNOSIS — I10 Essential (primary) hypertension: Secondary | ICD-10-CM | POA: Diagnosis not present

## 2015-03-02 DIAGNOSIS — R748 Abnormal levels of other serum enzymes: Secondary | ICD-10-CM | POA: Diagnosis not present

## 2015-03-02 DIAGNOSIS — R7989 Other specified abnormal findings of blood chemistry: Secondary | ICD-10-CM

## 2015-03-02 LAB — BASIC METABOLIC PANEL
BUN: 27 mg/dL — ABNORMAL HIGH (ref 7–25)
CHLORIDE: 106 mmol/L (ref 98–110)
CO2: 27 mmol/L (ref 20–31)
CREATININE: 1.45 mg/dL — AB (ref 0.70–1.33)
Calcium: 9.2 mg/dL (ref 8.6–10.3)
GLUCOSE: 119 mg/dL — AB (ref 65–99)
Potassium: 4.1 mmol/L (ref 3.5–5.3)
SODIUM: 139 mmol/L (ref 135–146)

## 2015-03-02 MED ORDER — LOSARTAN POTASSIUM 100 MG PO TABS: 100.0000 mg | ORAL_TABLET | Freq: Every day | ORAL | Status: DC

## 2015-03-02 NOTE — Patient Instructions (Addendum)
Add losartan to Lasix and amlodipine. Return next week for blood pressure check. Basic metabolic panel is pending

## 2015-03-02 NOTE — Progress Notes (Signed)
   Subjective:    Patient ID: Larry Clements, male    DOB: [birth year], 59 y.o.   MRN: [id number]  HPI At last visit we stopped lisinopril HCTZ because of elevated serum creatinine. He's now on Lasix and amlodipine. Blood pressure is elevated. He feels okay. He certainly going to need an additional agent to control his blood pressure. Basic metabolic panel drawn today. In June his creatinine was 1.57 and most recently it was 1.63. Long-standing history of hypertension. No history of diabetes. Has not had ultrasound of kidneys.    Review of Systems     Objective:   Physical Exam  Chest clear to auscultation. Cardiac exam regular rate and rhythm. Extremities without edema. Blood pressure 168/98 verified.      Assessment & Plan:  Elevated blood pressure off lisinopril  Elevated serum creatinine  Plan: He'll start losartan 100 mg daily, continue Lasix 20 mg daily, continue amlodipine 10 mg daily. Come by next week for blood pressure check.

## 2015-03-03 ENCOUNTER — Telehealth: Payer: Self-pay

## 2015-03-03 NOTE — Telephone Encounter (Signed)
Patient aware of results and recommendations. °

## 2015-03-03 NOTE — Telephone Encounter (Signed)
-----  Message from Elby Showers, MD sent at 03/03/2015 10:48 AM EDT ----- Call pt. Marked improvement in creatinine after stopping Lisinopril. We will check B-met on Losartan (new med)  when he returns.

## 2015-03-09 ENCOUNTER — Encounter: Payer: Self-pay | Admitting: Internal Medicine

## 2015-03-09 ENCOUNTER — Ambulatory Visit (INDEPENDENT_AMBULATORY_CARE_PROVIDER_SITE_OTHER): Payer: BLUE CROSS/BLUE SHIELD | Admitting: Internal Medicine

## 2015-03-09 VITALS — BP 150/90 | HR 84 | Resp 18 | Ht 76.0 in | Wt 248.0 lb

## 2015-03-09 DIAGNOSIS — R748 Abnormal levels of other serum enzymes: Secondary | ICD-10-CM

## 2015-03-09 DIAGNOSIS — I10 Essential (primary) hypertension: Secondary | ICD-10-CM

## 2015-03-09 DIAGNOSIS — R7989 Other specified abnormal findings of blood chemistry: Secondary | ICD-10-CM

## 2015-03-09 MED ORDER — FUROSEMIDE 40 MG PO TABS: 40.0000 mg | ORAL_TABLET | Freq: Every day | ORAL | Status: DC

## 2015-03-09 NOTE — Patient Instructions (Signed)
Continue losartan and amlodipine as previously prescribed. Increase Lasix to 40 mg daily. Return in 10 days.

## 2015-03-09 NOTE — Progress Notes (Signed)
   Subjective:    Patient ID: Larry Clements, male    DOB: 05-27-1955, 59 y.o.   MRN: [id number]  HPI after we stopped lisinopril and HCTZ his creatinine improved from 1.63 to 1.45. He is now on Lasix 20 mg daily, amlodipine 10 mg daily, losartan 100 mg daily. Denies noncompliance with medication. Initially his blood pressure was elevated in the office at [id number] systolically. When I checked it was 150 over 90.    Review of Systems     Objective:   Physical Exam  Blood pressure 150/90. No lower extremity edema. Chest clear. Cardiac exam regular rate and rhythm.      Assessment & Plan:  Essential hypertension  Elevated serum creatinine improved after stopping lisinopril HCTZ now on  Losartan and Lasix  Plan: Increase Lasix to 40 mg daily. Continue losartan 100 mg daily and amlodipine 10 mg daily. Return in 10 days. No labs drawn today.

## 2015-03-17 ENCOUNTER — Encounter: Payer: Self-pay | Admitting: Internal Medicine

## 2015-03-17 ENCOUNTER — Ambulatory Visit (INDEPENDENT_AMBULATORY_CARE_PROVIDER_SITE_OTHER): Payer: BLUE CROSS/BLUE SHIELD | Admitting: Internal Medicine

## 2015-03-17 VITALS — BP 160/94 | HR 76 | Temp 97.3°F | Resp 18 | Ht 76.0 in | Wt 246.0 lb

## 2015-03-17 DIAGNOSIS — T502X5A Adverse effect of carbonic-anhydrase inhibitors, benzothiadiazides and other diuretics, initial encounter: Secondary | ICD-10-CM

## 2015-03-17 DIAGNOSIS — E876 Hypokalemia: Secondary | ICD-10-CM

## 2015-03-17 DIAGNOSIS — I10 Essential (primary) hypertension: Secondary | ICD-10-CM

## 2015-03-17 LAB — POTASSIUM: POTASSIUM: 3.6 mmol/L (ref 3.5–5.3)

## 2015-03-24 ENCOUNTER — Other Ambulatory Visit: Payer: Self-pay | Admitting: Internal Medicine

## 2015-03-28 ENCOUNTER — Other Ambulatory Visit: Payer: Self-pay | Admitting: Internal Medicine

## 2015-04-05 ENCOUNTER — Other Ambulatory Visit: Payer: Self-pay | Admitting: Internal Medicine

## 2015-04-06 ENCOUNTER — Encounter: Payer: Self-pay | Admitting: Internal Medicine

## 2015-04-06 ENCOUNTER — Ambulatory Visit (INDEPENDENT_AMBULATORY_CARE_PROVIDER_SITE_OTHER): Payer: BLUE CROSS/BLUE SHIELD | Admitting: Internal Medicine

## 2015-04-06 VITALS — BP 150/80 | Wt 239.0 lb

## 2015-04-06 DIAGNOSIS — Z20828 Contact with and (suspected) exposure to other viral communicable diseases: Secondary | ICD-10-CM

## 2015-04-06 DIAGNOSIS — I10 Essential (primary) hypertension: Secondary | ICD-10-CM

## 2015-04-06 MED ORDER — FUROSEMIDE 40 MG PO TABS: 40.0000 mg | ORAL_TABLET | Freq: Every day | ORAL | Status: DC

## 2015-04-06 MED ORDER — AMLODIPINE BESYLATE 5 MG PO TABS: 10.0000 mg | ORAL_TABLET | Freq: Every day | ORAL | Status: DC

## 2015-04-06 MED ORDER — AMLODIPINE BESYLATE 5 MG PO TABS
5.0000 mg | ORAL_TABLET | Freq: Every day | ORAL | Status: DC
Start: 2015-04-06 — End: 2015-05-26

## 2015-04-06 NOTE — Progress Notes (Signed)
   Subjective:    Patient ID: Larry Clements, male    DOB: 1956-04-21, 59 y.o.   MRN: [id number]  HPI Here today for recheck on hypertension. He is on losartan 100 mg daily, Lasix 40 mg daily, 2 potassium tablets daily, amlodipine 10 mg daily. Denies noncompliance with medication. Despite all of these medications his blood pressure is elevated at 150/80 on arrival. He has empty bottle of Lasix with them. Says he's been taking it. I have represcribed Lasix 40 mg daily. Looks like amlodipine needs to be refilled as well. Had been on 10 mg of amlodipine daily. I'm reducing it 5 mg daily. I'm adding by stolid 5 mg daily. He feels well without complaints. Says he's taking potassium supplement 2 a day.  He wants to be retested for herpes simplex type II. We did this in 2015. I'm not sure of the point because he has positive antibodies but he is fairly insistent about having this done       Review of Systems     Objective:   Physical Exam Skin warm and dry. Chest clear to auscultation. Cardiac exam regular rate and rhythm. Extremity without edema.       Assessment & Plan:  Essential hypertension  History of elevated creatinine on ACE inhibitor. Now on ARB with normal creatinine  Plan: Reduce amlodipine to 5 mg daily, Lasix 40 mg daily-this was refilled, continue to potassium  supplement 2x 20 milliequivalent tablets daily daily, advised Bystolic 5 mg daily samples provided, continue losartan 100 mg daily

## 2015-04-06 NOTE — Patient Instructions (Signed)
Take amlodipine 5 mg daily. Continue losartan 100 mg daily and Lasix 40 mg daily. Add Bystolic 5 mg daily. Continue potassium supplement. Return in 2 weeks for office visit and basic metabolic panel.

## 2015-04-07 LAB — HSV(HERPES SMPLX)ABS-I+II(IGG+IGM)-BLD
HSV 1 GLYCOPROTEIN G AB, IGG: 6.97 IV — AB
HSV 2 Glycoprotein G Ab, IgG: 2.04 IV — ABNORMAL HIGH
Herpes Simplex Vrs I&II-IgM Ab (EIA): 2.28 INDEX — ABNORMAL HIGH

## 2015-04-13 ENCOUNTER — Encounter: Payer: Self-pay | Admitting: Internal Medicine

## 2015-04-13 NOTE — Patient Instructions (Signed)
Potassium drawn. Continue same medications and return November 22. Make sure patient is taking potassium supplement.

## 2015-04-13 NOTE — Progress Notes (Signed)
   Subjective:    Patient ID: Larry Clements, male    DOB: Oct 03, 1955, 59 y.o.   MRN: [id number]  HPI He is here today to follow-up blood pressure check. At last visit Lasix was increased from 20-40 mg daily. He's also amlodipine and lisinopril 100 mg daily.    Review of Systems     Objective:   Physical Exam Chest clear. Cardiac exam regular rate and rhythm. Extremities without edema blood pressure 160/94 despite 3 drug regimen.       Assessment & Plan:  Difficult to control hypertension  Plan: Serum potassium drawn today Lasix 40 mg daily. Return November 22. Continue same medications. I have some suspicion he may not be compliant with all of these medicines.

## 2015-04-23 ENCOUNTER — Ambulatory Visit (INDEPENDENT_AMBULATORY_CARE_PROVIDER_SITE_OTHER): Payer: BLUE CROSS/BLUE SHIELD | Admitting: Internal Medicine

## 2015-04-23 ENCOUNTER — Encounter: Payer: Self-pay | Admitting: Internal Medicine

## 2015-04-23 VITALS — BP 160/94 | HR 71 | Temp 98.2°F | Resp 20 | Wt 246.0 lb

## 2015-04-23 DIAGNOSIS — Z5181 Encounter for therapeutic drug level monitoring: Secondary | ICD-10-CM | POA: Diagnosis not present

## 2015-04-23 DIAGNOSIS — Z79899 Other long term (current) drug therapy: Secondary | ICD-10-CM

## 2015-04-23 DIAGNOSIS — I10 Essential (primary) hypertension: Secondary | ICD-10-CM

## 2015-04-23 LAB — BASIC METABOLIC PANEL
BUN: 23 mg/dL (ref 7–25)
CHLORIDE: 105 mmol/L (ref 98–110)
CO2: 25 mmol/L (ref 20–31)
CREATININE: 1.62 mg/dL — AB (ref 0.70–1.33)
Calcium: 9.5 mg/dL (ref 8.6–10.3)
GLUCOSE: 88 mg/dL (ref 65–99)
Potassium: 4 mmol/L (ref 3.5–5.3)
Sodium: 142 mmol/L (ref 135–146)

## 2015-04-23 NOTE — Patient Instructions (Signed)
Make sure you're taking losartan 100 mg daily, Lasix 40 mg daily, amlodipine 10 mg daily, Bystolic samples 10 mg daily and potassium supplement. Return in 4 weeks.

## 2015-04-23 NOTE — Progress Notes (Signed)
   Subjective:    Patient ID: Larry Clements, male    DOB: 1956/04/29, 59 y.o.   MRN: [id number]  HPI Blood pressure is elevated today. He just got off from work at [employer] a couple of hours ago. He's going to work  at 2:30 in the morning until maybe 8 in the morning. He will have some time off in early January. At last visit, I placed him on Bystolic 5 mg daily. We called the pharmacy today and found out he had not picked up his losartan since October. I thought he was on this medication. He is also on amlodipine and Lasix 40 mg daily. When I rechecked his blood pressure I got 130/94.    Review of Systems     Objective:   Physical Exam  Blood pressure 134/90. Chest clear. Cardiac exam regular rate and rhythm. Extremities without edema.      Assessment & Plan:  Noncompliance with blood pressure regimen  Elevated blood pressure  Essential hypertension  Plan: I wrote down all the medications he is supposed to be on. He will take Bystolic 10 mg daily, Lasix 40 mg daily, losartan 100 mg daily, amlodipine 10 mg daily. He will also take potassium supplementation. He will return in 4 weeks.

## 2015-05-14 ENCOUNTER — Ambulatory Visit (INDEPENDENT_AMBULATORY_CARE_PROVIDER_SITE_OTHER): Payer: BLUE CROSS/BLUE SHIELD | Admitting: Internal Medicine

## 2015-05-14 ENCOUNTER — Encounter: Payer: Self-pay | Admitting: Internal Medicine

## 2015-05-14 VITALS — BP 130/80 | HR 75 | Temp 98.1°F | Resp 20 | Ht 76.0 in | Wt 246.0 lb

## 2015-05-14 DIAGNOSIS — I10 Essential (primary) hypertension: Secondary | ICD-10-CM

## 2015-05-14 NOTE — Patient Instructions (Signed)
Continue losartan, Bystolic, Lasix, amlodipine and return in 3 months

## 2015-05-14 NOTE — Progress Notes (Signed)
   Subjective:    Patient ID: Jiyaan Henningson, male    DOB: [birth year], 59 y.o.   MRN: [id number]  HPI 59 year old 59 male with history of hypertension here today for recheck. He is on losartan 100 mg, Lasix 40 mg daily, Bystolic 10 mg daily, amlodipine 5 mg daily. Initially blood pressure was elevated when he first arrived but after resting it was repeated and was 130/80. I think this is acceptable. I have given him samples of Bystolic 10 mg daily to last 97 days.    Review of Systems     Objective:   Physical Exam  Skin is warm and dry. Nodes none. Neck is supple without JVD thyromegaly or carotid bruits. Chest clear to auscultation. Cardiac exam regular rate and rhythm. Extremities without edema.      Assessment & Plan:  Essential hypertension. We had to stop lisinopril HCTZ because of elevated serum creatinine. Creatinine improved on losartan. Blood pressure now is very acceptable but is taking a 4 drug regimen to keep it under control.  Plan: He'll return in 3 months for office visit and blood pressure check

## 2015-05-15 ENCOUNTER — Telehealth: Payer: Self-pay | Admitting: Internal Medicine

## 2015-05-15 MED ORDER — FUROSEMIDE 40 MG PO TABS: 40.0000 mg | ORAL_TABLET | Freq: Every day | ORAL | Status: DC

## 2015-05-15 NOTE — Telephone Encounter (Signed)
Receive fax request from [facility] regarding 90 day refill request on Furosemide 20 mg tablets.  However, patient is supposed to be on Furosemide 40 mg tablets daily  Fax corrected and sent to reflect Furosemide 40 mg daily #90 with one refill.

## 2015-05-26 ENCOUNTER — Telehealth: Payer: Self-pay | Admitting: Internal Medicine

## 2015-05-26 MED ORDER — AMLODIPINE BESYLATE 5 MG PO TABS: 5.0000 mg | ORAL_TABLET | Freq: Every day | ORAL | Status: DC

## 2015-05-26 NOTE — Telephone Encounter (Signed)
Refill amlodipine 5 mg daily #90 with when necessary one year refills at request of [facility]

## 2015-07-27 ENCOUNTER — Other Ambulatory Visit: Payer: Self-pay | Admitting: Internal Medicine

## 2015-07-27 NOTE — Telephone Encounter (Signed)
Last OV late December says pt needs appt in 3 months. No appt on book. Please call and arrange appt for late March

## 2015-07-29 NOTE — Telephone Encounter (Signed)
Left message for return call.

## 2015-09-09 ENCOUNTER — Other Ambulatory Visit: Payer: Self-pay

## 2015-09-09 MED ORDER — FUROSEMIDE 40 MG PO TABS: 40.0000 mg | ORAL_TABLET | Freq: Every day | ORAL | Status: DC

## 2015-09-09 MED ORDER — LOSARTAN POTASSIUM 100 MG PO TABS: 100.0000 mg | ORAL_TABLET | Freq: Every day | ORAL | Status: DC

## 2015-10-04 ENCOUNTER — Other Ambulatory Visit: Payer: Self-pay | Admitting: Internal Medicine

## 2015-10-18 ENCOUNTER — Other Ambulatory Visit: Payer: Self-pay

## 2015-10-18 NOTE — Telephone Encounter (Signed)
Patient due for OV in March.

## 2015-11-18 ENCOUNTER — Other Ambulatory Visit: Payer: Self-pay | Admitting: Internal Medicine

## 2015-11-18 NOTE — Telephone Encounter (Signed)
Please sort out which is his correct dose 5 mg or 10 mg. Computer is confused about dose

## 2015-11-19 ENCOUNTER — Other Ambulatory Visit: Payer: Self-pay

## 2015-11-19 MED ORDER — AMLODIPINE BESYLATE 5 MG PO TABS: 5.0000 mg | ORAL_TABLET | Freq: Every day | ORAL | Status: DC

## 2015-11-19 NOTE — Telephone Encounter (Signed)
Wrong dosage; pt takes 5mg 

## 2015-12-28 ENCOUNTER — Other Ambulatory Visit: Payer: Self-pay | Admitting: Internal Medicine

## 2015-12-28 NOTE — Telephone Encounter (Signed)
Refill x 30 days needs OV

## 2016-02-23 ENCOUNTER — Other Ambulatory Visit: Payer: Self-pay | Admitting: Internal Medicine

## 2016-02-23 NOTE — Telephone Encounter (Signed)
Verbal order by Dr. Renold Genta to refill Furosemide 40mg , #90.  To [name] to be e-scribed.   Per Dr. Renold Genta, patient needs to be seen for visit.  Spoke with patient and booked for CPE & Labs on 05/09/16 @ 11:00 a.m.

## 2016-03-09 ENCOUNTER — Other Ambulatory Visit: Payer: Self-pay | Admitting: Internal Medicine

## 2016-03-25 ENCOUNTER — Other Ambulatory Visit: Payer: Self-pay | Admitting: Internal Medicine

## 2016-05-09 ENCOUNTER — Encounter: Payer: Self-pay | Admitting: Internal Medicine

## 2016-05-09 ENCOUNTER — Ambulatory Visit (INDEPENDENT_AMBULATORY_CARE_PROVIDER_SITE_OTHER): Payer: BLUE CROSS/BLUE SHIELD | Admitting: Internal Medicine

## 2016-05-09 VITALS — BP 160/100 | HR 74 | Ht 76.0 in | Wt 247.0 lb

## 2016-05-09 DIAGNOSIS — Z0000 Encounter for general adult medical examination without abnormal findings: Secondary | ICD-10-CM | POA: Diagnosis not present

## 2016-05-09 DIAGNOSIS — E876 Hypokalemia: Secondary | ICD-10-CM

## 2016-05-09 DIAGNOSIS — Z130 Encounter for screening for diseases of the blood and blood-forming organs and certain disorders involving the immune mechanism: Secondary | ICD-10-CM | POA: Diagnosis not present

## 2016-05-09 DIAGNOSIS — Z13220 Encounter for screening for lipoid disorders: Secondary | ICD-10-CM | POA: Diagnosis not present

## 2016-05-09 DIAGNOSIS — R7989 Other specified abnormal findings of blood chemistry: Secondary | ICD-10-CM | POA: Insufficient documentation

## 2016-05-09 DIAGNOSIS — Z125 Encounter for screening for malignant neoplasm of prostate: Secondary | ICD-10-CM | POA: Diagnosis not present

## 2016-05-09 DIAGNOSIS — E781 Pure hyperglyceridemia: Secondary | ICD-10-CM | POA: Diagnosis not present

## 2016-05-09 DIAGNOSIS — I10 Essential (primary) hypertension: Secondary | ICD-10-CM | POA: Diagnosis not present

## 2016-05-09 DIAGNOSIS — T502X5A Adverse effect of carbonic-anhydrase inhibitors, benzothiadiazides and other diuretics, initial encounter: Secondary | ICD-10-CM | POA: Diagnosis not present

## 2016-05-09 DIAGNOSIS — R319 Hematuria, unspecified: Secondary | ICD-10-CM | POA: Diagnosis not present

## 2016-05-09 LAB — POC URINALSYSI DIPSTICK (AUTOMATED)
BILIRUBIN UA: NEGATIVE
GLUCOSE UA: NEGATIVE
KETONES UA: NEGATIVE
Leukocytes, UA: NEGATIVE
NITRITE UA: NEGATIVE
PH UA: 6
SPEC GRAV UA: 1.02
Urobilinogen, UA: NEGATIVE

## 2016-05-09 LAB — CBC WITH DIFFERENTIAL/PLATELET
BASOS ABS: 0 {cells}/uL (ref 0–200)
Basophils Relative: 0 %
EOS ABS: 156 {cells}/uL (ref 15–500)
Eosinophils Relative: 3 %
HCT: 44.2 % (ref 38.5–50.0)
Hemoglobin: 14.2 g/dL (ref 13.2–17.1)
LYMPHS PCT: 24 %
Lymphs Abs: 1248 cells/uL (ref 850–3900)
MCH: 27.7 pg (ref 27.0–33.0)
MCHC: 32.1 g/dL (ref 32.0–36.0)
MCV: 86.2 fL (ref 80.0–100.0)
MONOS PCT: 8 %
MPV: 10.5 fL (ref 7.5–12.5)
Monocytes Absolute: 416 cells/uL (ref 200–950)
Neutro Abs: 3380 cells/uL (ref 1500–7800)
Neutrophils Relative %: 65 %
PLATELETS: 266 10*3/uL (ref 140–400)
RBC: 5.13 MIL/uL (ref 4.20–5.80)
RDW: 13.6 % (ref 11.0–15.0)
WBC: 5.2 10*3/uL (ref 3.8–10.8)

## 2016-05-09 LAB — COMPREHENSIVE METABOLIC PANEL
ALT: 28 U/L (ref 9–46)
AST: 29 U/L (ref 10–35)
Albumin: 4 g/dL (ref 3.6–5.1)
Alkaline Phosphatase: 91 U/L (ref 40–115)
BUN: 28 mg/dL — AB (ref 7–25)
CHLORIDE: 107 mmol/L (ref 98–110)
CO2: 28 mmol/L (ref 20–31)
Calcium: 9.3 mg/dL (ref 8.6–10.3)
Creat: 1.72 mg/dL — ABNORMAL HIGH (ref 0.70–1.33)
GLUCOSE: 93 mg/dL (ref 65–99)
POTASSIUM: 3.9 mmol/L (ref 3.5–5.3)
Sodium: 140 mmol/L (ref 135–146)
Total Bilirubin: 0.5 mg/dL (ref 0.2–1.2)
Total Protein: 7.4 g/dL (ref 6.1–8.1)

## 2016-05-09 LAB — LIPID PANEL
CHOL/HDL RATIO: 3.9 ratio (ref <5.0)
Cholesterol: 151 mg/dL (ref <200)
HDL: 39 mg/dL — ABNORMAL LOW (ref >40)
LDL Cholesterol: 78 mg/dL (ref <100)
Triglycerides: 171 mg/dL — ABNORMAL HIGH (ref <150)
VLDL: 34 mg/dL — AB (ref <30)

## 2016-05-09 LAB — PSA: PSA: 3.8 ng/mL (ref <=4.0)

## 2016-05-09 MED ORDER — AMLODIPINE BESYLATE 10 MG PO TABS: 10.0000 mg | ORAL_TABLET | Freq: Every day | ORAL | 3 refills | Status: DC

## 2016-05-09 NOTE — Patient Instructions (Signed)
Review lab work was drawn and pending today. Return next week for follow-up on hypertension. Increase amlodipine to 10 mg daily.

## 2016-05-09 NOTE — Progress Notes (Signed)
   Subjective:    Patient ID: Larry Clements, male    DOB: 09-16-1955, 60 y.o.   MRN: [id number]  HPI  60 year old Black  is a diet  Male for health maintenance examAnd evaluation of medical issues. Main issue is essential hypertension. I am going to increase amlodipine to 10 mg daily and he should return in a week to 10 days. He's been working a lot of hours at [employer]. His weight is 247 pounds. Previous weight June 2016 245 1/2 pounds. He gets lots of exercise with his work. 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  No known drug allergies  No history of serious illnesses or accidents. History of ganglion cyst right wrist 1998.   He has had a colonoscopy. This was done September 2016 by Dr. Fuller Plan and was normal with repeat colonoscopy recommended in 10 years.  He declines flu vaccine.  Social history: He is divorced. 2 adult children. Does not smoke or consume alcohol.  Family history: Father died at age 54 with renal failure and history of diabetes. Mother with history of hypertension deceased. 2 brothers healthy. 2 sisters healthy.    Review of Systems see above no new complaints.  He has a scar right breast where he says a benign lesion was removed many years ago     Objective:   Physical Exam  Constitutional: He is oriented to person, place, and time. He appears well-developed and well-nourished. No distress.  HENT:  Head: Normocephalic and atraumatic.  Right Ear: External ear normal.  Left Ear: External ear normal.  Mouth/Throat: Oropharynx is clear and moist.  Eyes: Conjunctivae and EOM are normal. Pupils are equal, round, and reactive to light. No scleral icterus.  Neck: Neck supple. No JVD present. No thyromegaly present.  Cardiovascular: Normal rate, regular rhythm, normal heart sounds and intact distal pulses.   No murmur heard. Pulmonary/Chest: Effort normal and breath sounds normal. No respiratory distress. He has no wheezes. He has no rales. He exhibits no  tenderness.  Well-healed linear scar mid right nipple where he says a benign lesion was removed many years ago.  Abdominal: Soft. Bowel sounds are normal. He exhibits no distension and no mass. There is no tenderness. There is no rebound and no guarding.  Genitourinary: Prostate normal.  Neurological: He is alert and oriented to person, place, and time. He has normal reflexes. No cranial nerve deficit. Coordination normal.  Skin: Skin is warm and dry. No rash noted.  Psychiatric: He has a normal mood and affect. His behavior is normal. Judgment and thought content normal.  Vitals reviewed.         Assessment & Plan:  Essential hypertension-has been somewhat difficult to control. Patient has been working long hours and is quite tired today. We are going to increase amlodipine to 10 mg daily and reassess in 7-10 days. Fasting labs drawn and pending.  History of hypokalemia-he is on Lasix. Has potassium supplement prescribed  History of elevated serum creatinine -continue to monitor  Triglycerides elevated at 171-this is new for him

## 2016-05-10 LAB — URINALYSIS, MICROSCOPIC ONLY
BACTERIA UA: NONE SEEN [HPF]
CASTS: NONE SEEN [LPF]
CRYSTALS: NONE SEEN [HPF]
Squamous Epithelial / LPF: NONE SEEN [HPF] (ref <=5)
WBC, UA: NONE SEEN WBC/HPF (ref <=5)
YEAST: NONE SEEN [HPF]

## 2016-05-10 LAB — URINE CULTURE: ORGANISM ID, BACTERIA: NO GROWTH

## 2016-05-17 ENCOUNTER — Encounter: Payer: Self-pay | Admitting: Internal Medicine

## 2016-05-17 ENCOUNTER — Ambulatory Visit (INDEPENDENT_AMBULATORY_CARE_PROVIDER_SITE_OTHER): Payer: BLUE CROSS/BLUE SHIELD | Admitting: Internal Medicine

## 2016-05-17 VITALS — BP 162/100 | HR 92 | Temp 98.2°F | Wt 250.0 lb

## 2016-05-17 DIAGNOSIS — J111 Influenza due to unidentified influenza virus with other respiratory manifestations: Secondary | ICD-10-CM

## 2016-05-17 DIAGNOSIS — I10 Essential (primary) hypertension: Secondary | ICD-10-CM

## 2016-05-17 MED ORDER — AMOXICILLIN 500 MG PO CAPS: 500.0000 mg | ORAL_CAPSULE | Freq: Three times a day (TID) | ORAL | 0 refills | Status: DC

## 2016-05-17 MED ORDER — HYDROCODONE-HOMATROPINE 5-1.5 MG/5ML PO SYRP: 5.0000 mL | ORAL_SOLUTION | Freq: Three times a day (TID) | ORAL | 0 refills | Status: DC | PRN

## 2016-05-17 NOTE — Progress Notes (Signed)
   Subjective:    Patient ID: Larry Clements, male    DOB: 1955-06-01, 61 y.o.   MRN: [id number]  HPI  61 year old Male with chills, myalgias. He does not feel well today and his blood pressure is elevated 160/100. He has had cough cold and congestion. Currently afebrile. He has had myalgias. No nausea and vomiting but decreased appetite.   Review of Systems see above     Objective:   Physical Exam Pharynx is injected. No exudate. Right TM obscured by cerumen. Left TM slightly full but not red. Neck is supple. Chest clear to auscultation.       Assessment & Plan:  Probable influenza  Plan: Amoxicillin 500 mg 3 times daily for 10 days. Hycodan 1 teaspoon by mouth every 8 hours when necessary cough. Tylenol or Advil as needed for fever and myalgias. Rest and drink plenty of fluids. Return in 2 weeks regarding blood pressure management.

## 2016-05-17 NOTE — Patient Instructions (Signed)
Amoxicillin 500 mg 3 times daily for 10 days. Tylenol or Advil as needed for fever and/or myalgias. Hycodan 1 teaspoon by mouth every 8 hours when necessary cough. Rest and drink plenty of fluids.

## 2016-05-22 ENCOUNTER — Telehealth: Payer: Self-pay | Admitting: Internal Medicine

## 2016-05-22 NOTE — Telephone Encounter (Signed)
Was given Amoxicillin- did he not pick that up?

## 2016-05-22 NOTE — Telephone Encounter (Signed)
Patient calling; states that he was seen last week by you, but wasn't prescribed anything for his ears.  States that he is having Left ear pain and wants to know if you will call him in something for an ear infection?    Pharmacy:  [facility] [address] # for contact:  [phone]

## 2016-05-22 NOTE — Telephone Encounter (Signed)
Patient called requesting something for his ear pain.  Per last office visit patient was given amoxicillin and cough medicine.  Per patient he did not receive the amoxicillin.  I called the pharmacy and they say the patient did pick the medication up.  Advised patient he is not getting another prescription at this time.

## 2016-05-22 NOTE — Telephone Encounter (Signed)
Advised patient amoxicillin was sent to pharmacy.

## 2016-05-23 ENCOUNTER — Encounter: Payer: Self-pay | Admitting: Internal Medicine

## 2016-05-23 ENCOUNTER — Ambulatory Visit (INDEPENDENT_AMBULATORY_CARE_PROVIDER_SITE_OTHER): Payer: BLUE CROSS/BLUE SHIELD | Admitting: Internal Medicine

## 2016-05-23 VITALS — BP 178/100 | HR 70 | Temp 97.8°F | Wt 250.0 lb

## 2016-05-23 DIAGNOSIS — H8110 Benign paroxysmal vertigo, unspecified ear: Secondary | ICD-10-CM

## 2016-05-23 DIAGNOSIS — I10 Essential (primary) hypertension: Secondary | ICD-10-CM

## 2016-05-23 LAB — CBC WITH DIFFERENTIAL/PLATELET
BASOS PCT: 0 %
Basophils Absolute: 0 cells/uL (ref 0–200)
EOS ABS: 135 {cells}/uL (ref 15–500)
EOS PCT: 3 %
HCT: 44.7 % (ref 38.5–50.0)
HEMOGLOBIN: 14.6 g/dL (ref 13.2–17.1)
LYMPHS ABS: 1260 {cells}/uL (ref 850–3900)
Lymphocytes Relative: 28 %
MCH: 27.3 pg (ref 27.0–33.0)
MCHC: 32.7 g/dL (ref 32.0–36.0)
MCV: 83.6 fL (ref 80.0–100.0)
MPV: 10.1 fL (ref 7.5–12.5)
Monocytes Absolute: 360 cells/uL (ref 200–950)
Monocytes Relative: 8 %
NEUTROS ABS: 2745 {cells}/uL (ref 1500–7800)
Neutrophils Relative %: 61 %
Platelets: 273 10*3/uL (ref 140–400)
RBC: 5.35 MIL/uL (ref 4.20–5.80)
RDW: 13.6 % (ref 11.0–15.0)
WBC: 4.5 10*3/uL (ref 3.8–10.8)

## 2016-05-23 LAB — COMPLETE METABOLIC PANEL WITH GFR
ALT: 29 U/L (ref 9–46)
AST: 22 U/L (ref 10–35)
Albumin: 3.7 g/dL (ref 3.6–5.1)
Alkaline Phosphatase: 88 U/L (ref 40–115)
BUN: 22 mg/dL (ref 7–25)
CALCIUM: 9 mg/dL (ref 8.6–10.3)
CHLORIDE: 103 mmol/L (ref 98–110)
CO2: 25 mmol/L (ref 20–31)
CREATININE: 1.5 mg/dL — AB (ref 0.70–1.33)
GFR, EST AFRICAN AMERICAN: 58 mL/min — AB (ref >=60)
GFR, Est Non African American: 51 mL/min — ABNORMAL LOW (ref >=60)
Glucose, Bld: 123 mg/dL — ABNORMAL HIGH (ref 65–99)
POTASSIUM: 3.7 mmol/L (ref 3.5–5.3)
Sodium: 137 mmol/L (ref 135–146)
Total Bilirubin: 0.6 mg/dL (ref 0.2–1.2)
Total Protein: 7.4 g/dL (ref 6.1–8.1)

## 2016-05-23 MED ORDER — LEVOFLOXACIN 500 MG PO TABS: 500.0000 mg | ORAL_TABLET | Freq: Every day | ORAL | 0 refills | Status: DC

## 2016-05-23 MED ORDER — MECLIZINE HCL 25 MG PO TABS: 25.0000 mg | ORAL_TABLET | Freq: Three times a day (TID) | ORAL | 0 refills | Status: DC | PRN

## 2016-05-23 NOTE — Progress Notes (Signed)
   Subjective:    Patient ID: Larry Clements, male    DOB: 01-Jan-1956, 61 y.o.   MRN: [id number]  HPI 61 year old black male was here last week with flulike illness. Was complaining of left ear pain and congestion. Was placed on amoxicillin which she is still taking. Says he doesn't feel right in the hand. Has odd feeling with position change. I think he may have vertigo. He has hypertension. Blood pressure is elevated today at 256 systolically. We've been following blood pressure for some time. He's been out of work for several days.    Review of Systems no nausea or vomiting. Has been eating and drinking fluids.     Objective:   Physical Exam  He has rightward nystagmus. No focal deficits on brief neurological exam. Left TM is full but not red. Right TM clear. Pharynx slightly injected. Neck supple. Chest clear to auscultation without rales or wheezing.      Assessment & Plan:  Vertigo  Left serous otitis media  Viral syndrome  Essential hypertension  Plan: Levaquin 500 milligrams daily for 7 days. Return in one week. Out of work until rechecked. Continue antihypertensive medications as previously prescribed. CBC with differential and C met drawn today. Meclizine 25 mg 3 times daily as needed for dizziness.

## 2016-05-23 NOTE — Patient Instructions (Signed)
Meclizine 3 times daily as needed for dizziness. Levaquin 500 milligrams daily with a meal for 7 days. Return in one week. Out of work until rechecked.

## 2016-05-23 NOTE — Progress Notes (Signed)
cbc

## 2016-05-30 ENCOUNTER — Encounter: Payer: Self-pay | Admitting: Internal Medicine

## 2016-05-30 ENCOUNTER — Ambulatory Visit (INDEPENDENT_AMBULATORY_CARE_PROVIDER_SITE_OTHER): Payer: BLUE CROSS/BLUE SHIELD | Admitting: Internal Medicine

## 2016-05-30 VITALS — BP 160/100 | HR 94 | Wt 255.0 lb

## 2016-05-30 DIAGNOSIS — J111 Influenza due to unidentified influenza virus with other respiratory manifestations: Secondary | ICD-10-CM

## 2016-05-30 DIAGNOSIS — I10 Essential (primary) hypertension: Secondary | ICD-10-CM | POA: Diagnosis not present

## 2016-05-30 DIAGNOSIS — R7989 Other specified abnormal findings of blood chemistry: Secondary | ICD-10-CM | POA: Diagnosis not present

## 2016-05-30 NOTE — Progress Notes (Signed)
   Subjective:    Patient ID: Larry Clements, male    DOB: 29-Jun-1955, 61 y.o.   MRN: [id number]  HPI  For follow up of influenza and HTN. Did not take meds until  9 am. Medications have only been in system about  one and one half hours hours.  Blood pressure is elevated at 160/100. He's beginning to feel better from a bout of the flu. He's been out of work for several days. Says he will return to work on January 18. Still feels a bit weak and washed out.    Review of Systems see above-dizziness has resolved     Objective:   Physical Exam  Chest is clear to auscultation. Cardiac exam: regular rate and rhythm, Extremities: without edema.       Assessment & Plan:  I am still concerned his blood pressure is not well controlled. We will seek nephrology consultation. There is history of chronic kidney disease in father and brother.  Influenza-improving  Plan: Appointment with [facility] regarding blood pressure control and chronic kidney disease

## 2016-05-30 NOTE — Patient Instructions (Signed)
To return to work January 18. Recovering from the flu. Appointment with [facility] regarding elevated blood pressure and chronic kidney disease

## 2016-06-23 ENCOUNTER — Other Ambulatory Visit: Payer: Self-pay | Admitting: Internal Medicine

## 2016-06-24 ENCOUNTER — Other Ambulatory Visit: Payer: Self-pay | Admitting: Internal Medicine

## 2016-06-26 ENCOUNTER — Telehealth: Payer: Self-pay | Admitting: Internal Medicine

## 2016-06-26 NOTE — Telephone Encounter (Signed)
Received referral appointment date from [facility].  Appointment with Dr. Jamal Maes for 07/13/16 @ 4:00 p.m.  Patient has been notified.  Spoke with patient to confirm.

## 2016-07-10 ENCOUNTER — Other Ambulatory Visit: Payer: Self-pay

## 2016-07-10 MED ORDER — AMLODIPINE BESYLATE 10 MG PO TABS
10.0000 mg | ORAL_TABLET | Freq: Every day | ORAL | 0 refills | Status: DC
Start: 2016-07-10 — End: 2017-02-13

## 2016-07-14 ENCOUNTER — Other Ambulatory Visit: Payer: Self-pay | Admitting: Nephrology

## 2016-07-14 DIAGNOSIS — N183 Chronic kidney disease, stage 3 unspecified: Secondary | ICD-10-CM

## 2016-07-14 DIAGNOSIS — I10 Essential (primary) hypertension: Secondary | ICD-10-CM

## 2016-07-31 ENCOUNTER — Ambulatory Visit
Admission: RE | Admit: 2016-07-31 | Discharge: 2016-07-31 | Disposition: A | Discharge status: Home / Self Care | Payer: BLUE CROSS/BLUE SHIELD | Source: Ambulatory Visit | Attending: Nephrology | Admitting: Nephrology

## 2016-07-31 DIAGNOSIS — N183 Chronic kidney disease, stage 3 unspecified: Secondary | ICD-10-CM

## 2016-07-31 DIAGNOSIS — I10 Essential (primary) hypertension: Secondary | ICD-10-CM

## 2016-08-02 ENCOUNTER — Other Ambulatory Visit: Payer: Self-pay | Admitting: Nephrology

## 2016-08-03 ENCOUNTER — Other Ambulatory Visit: Payer: Self-pay | Admitting: Nephrology

## 2016-08-03 DIAGNOSIS — I129 Hypertensive chronic kidney disease with stage 1 through stage 4 chronic kidney disease, or unspecified chronic kidney disease: Secondary | ICD-10-CM

## 2016-08-03 DIAGNOSIS — N183 Chronic kidney disease, stage 3 unspecified: Secondary | ICD-10-CM

## 2016-08-03 DIAGNOSIS — R1902 Left upper quadrant abdominal swelling, mass and lump: Secondary | ICD-10-CM

## 2016-08-03 DIAGNOSIS — R809 Proteinuria, unspecified: Secondary | ICD-10-CM

## 2016-08-03 DIAGNOSIS — Z6832 Body mass index (BMI) 32.0-32.9, adult: Secondary | ICD-10-CM

## 2016-08-17 ENCOUNTER — Ambulatory Visit
Admission: RE | Admit: 2016-08-17 | Discharge: 2016-08-17 | Disposition: A | Discharge status: Home / Self Care | Payer: BLUE CROSS/BLUE SHIELD | Source: Ambulatory Visit | Attending: Nephrology | Admitting: Nephrology

## 2016-08-17 DIAGNOSIS — N183 Chronic kidney disease, stage 3 unspecified: Secondary | ICD-10-CM

## 2016-08-17 DIAGNOSIS — R1902 Left upper quadrant abdominal swelling, mass and lump: Secondary | ICD-10-CM

## 2016-08-17 DIAGNOSIS — R809 Proteinuria, unspecified: Secondary | ICD-10-CM

## 2016-08-17 DIAGNOSIS — Z6832 Body mass index (BMI) 32.0-32.9, adult: Secondary | ICD-10-CM

## 2016-08-17 DIAGNOSIS — I129 Hypertensive chronic kidney disease with stage 1 through stage 4 chronic kidney disease, or unspecified chronic kidney disease: Secondary | ICD-10-CM

## 2016-08-17 MED ORDER — GADOBENATE DIMEGLUMINE 529 MG/ML IV SOLN
10.0000 mL | Freq: Once | INTRAVENOUS | Status: AC | PRN
  Administered 2016-08-17: 10 mL via INTRAVENOUS

## 2016-09-02 ENCOUNTER — Other Ambulatory Visit: Payer: Self-pay | Admitting: Internal Medicine

## 2016-09-12 ENCOUNTER — Telehealth: Payer: Self-pay

## 2016-09-12 NOTE — Telephone Encounter (Signed)
-----   Message from Milus Banister, MD sent at 09/12/2016  7:24 AM EDT ----- Regarding: RE: ? value of EUS OK,  Stefany Starace, he needs upper EUS, radial +/- linear next Thursday (10th) with MAC for abdominal mass.  Thanks  dj  ----- Message ----- From: Stark Klein, MD Sent: 09/11/2016   4:20 PM To: Milus Banister, MD, Donnie Mesa, MD Subject: RE: ? value of EUS                             Matt, We are going to review at conference and get him set up for EUS to make sure this is in the stomach.  Linna Hoff, Yes. tx FB  ----- Message ----- From: Milus Banister, MD Sent: 09/07/2016   8:35 AM To: Stark Klein, MD Subject: RE: ? value of EUS                             I think EUS will give good information, probably site of origin (sometimes when a tumor is very large, it is actually harder to tell where it originates because of all the anatomic distortion), definitely FNA cytology. Should we go ahead and call him to arrange??  DJ    ----- Message ----- From: Stark Klein, MD Sent: 09/06/2016   9:38 PM To: Milus Banister, MD Subject: ? value of EUS                                 Seeing this guy next week.  Tsuei saw him.  Putting him on for conference.  What do you think about EUS to see if in gastric wall?  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  tx FB   ----- Message ----- From: Donnie Mesa, MD Sent: 09/05/2016   5:32 PM To: Stark Klein, MD  I just referred this guy to see you next week.  Very nice healthy guy with an incidental finding of a 10 cm mass between stomach/ spleen/ pancreas.  No symptoms.  No previous surgery.  Doesn't smoke.  Doesn't drink.  Hard worker.  He had a colonoscopy in 2016 that was normal.  Tsuei

## 2016-09-13 ENCOUNTER — Encounter: Payer: Self-pay | Admitting: Genetics

## 2016-09-13 ENCOUNTER — Other Ambulatory Visit: Payer: Self-pay

## 2016-09-13 DIAGNOSIS — R1900 Intra-abdominal and pelvic swelling, mass and lump, unspecified site: Secondary | ICD-10-CM

## 2016-09-13 NOTE — Telephone Encounter (Signed)
EUS scheduled, pt instructed and medications reviewed.  Patient instructions mailed to home.  Patient to call with any questions or concerns.  

## 2016-09-13 NOTE — Telephone Encounter (Signed)
Pt has been scheduled for EUS on 09/21/16 730 am [facility]

## 2016-09-20 ENCOUNTER — Encounter (HOSPITAL_COMMUNITY): Payer: Self-pay | Admitting: *Deleted

## 2016-09-21 ENCOUNTER — Ambulatory Visit (HOSPITAL_COMMUNITY): Payer: BLUE CROSS/BLUE SHIELD | Admitting: Anesthesiology

## 2016-09-21 ENCOUNTER — Encounter (HOSPITAL_COMMUNITY)
Admission: RE | Disposition: A | Discharge status: Home / Self Care | Payer: Self-pay | Source: Ambulatory Visit | Attending: Gastroenterology

## 2016-09-21 ENCOUNTER — Other Ambulatory Visit: Payer: Self-pay | Admitting: General Surgery

## 2016-09-21 ENCOUNTER — Encounter (HOSPITAL_COMMUNITY): Payer: Self-pay

## 2016-09-21 ENCOUNTER — Ambulatory Visit (HOSPITAL_COMMUNITY)
Admission: RE | Admit: 2016-09-21 | Discharge: 2016-09-21 | Disposition: A | Discharge status: Home / Self Care | Payer: BLUE CROSS/BLUE SHIELD | Source: Ambulatory Visit | Attending: Gastroenterology | Admitting: Gastroenterology

## 2016-09-21 DIAGNOSIS — Z800 Family history of malignant neoplasm of digestive organs: Secondary | ICD-10-CM | POA: Insufficient documentation

## 2016-09-21 DIAGNOSIS — Z79899 Other long term (current) drug therapy: Secondary | ICD-10-CM | POA: Diagnosis not present

## 2016-09-21 DIAGNOSIS — R1900 Intra-abdominal and pelvic swelling, mass and lump, unspecified site: Secondary | ICD-10-CM

## 2016-09-21 DIAGNOSIS — I10 Essential (primary) hypertension: Secondary | ICD-10-CM | POA: Insufficient documentation

## 2016-09-21 DIAGNOSIS — B009 Herpesviral infection, unspecified: Secondary | ICD-10-CM | POA: Insufficient documentation

## 2016-09-21 DIAGNOSIS — Z8249 Family history of ischemic heart disease and other diseases of the circulatory system: Secondary | ICD-10-CM | POA: Diagnosis not present

## 2016-09-21 DIAGNOSIS — C49A0 Gastrointestinal stromal tumor, unspecified site: Secondary | ICD-10-CM

## 2016-09-21 DIAGNOSIS — D481 Neoplasm of uncertain behavior of connective and other soft tissue: Secondary | ICD-10-CM | POA: Diagnosis not present

## 2016-09-21 DIAGNOSIS — Z833 Family history of diabetes mellitus: Secondary | ICD-10-CM | POA: Insufficient documentation

## 2016-09-21 HISTORY — PX: EUS: SHX5427

## 2016-09-21 SURGERY — UPPER ENDOSCOPIC ULTRASOUND (EUS) LINEAR
Anesthesia: Monitor Anesthesia Care

## 2016-09-21 MED ORDER — PROPOFOL 10 MG/ML IV BOLUS
INTRAVENOUS | Status: AC
  Filled 2016-09-21: qty 20

## 2016-09-21 MED ORDER — SODIUM CHLORIDE 0.9 % IV SOLN: INTRAVENOUS | Status: DC

## 2016-09-21 MED ORDER — PROPOFOL 10 MG/ML IV BOLUS
INTRAVENOUS | Status: AC
  Filled 2016-09-21: qty 40

## 2016-09-21 MED ORDER — PROPOFOL 500 MG/50ML IV EMUL
INTRAVENOUS | Status: DC | PRN
  Administered 2016-09-21: 50 mg via INTRAVENOUS
  Administered 2016-09-21 (×2): 20 mg via INTRAVENOUS
  Administered 2016-09-21: 10 mg via INTRAVENOUS

## 2016-09-21 MED ORDER — PROPOFOL 500 MG/50ML IV EMUL
INTRAVENOUS | Status: DC | PRN
  Administered 2016-09-21: 100 ug/kg/min via INTRAVENOUS

## 2016-09-21 MED ORDER — LACTATED RINGERS IV SOLN
INTRAVENOUS | Status: DC
  Administered 2016-09-21: 07:00:00 via INTRAVENOUS

## 2016-09-21 NOTE — Anesthesia Postprocedure Evaluation (Signed)
Anesthesia Post Note  Patient: Larry Clements  Procedure(s) Performed: Procedure(s) (LRB): UPPER ENDOSCOPIC ULTRASOUND (EUS) LINEAR (N/A)  Patient location during evaluation: PACU Anesthesia Type: MAC Level of consciousness: awake and alert Pain management: pain level controlled Vital Signs Assessment: post-procedure vital signs reviewed and stable Respiratory status: spontaneous breathing, nonlabored ventilation, respiratory function stable and patient connected to nasal cannula oxygen Cardiovascular status: stable and blood pressure returned to baseline Anesthetic complications: no       Last Vitals:  Vitals:   09/21/16 0845 09/21/16 0850  BP:  (!) 187/104  Pulse: 67 73  Resp: 19 15  Temp:      Last Pain:  Vitals:   09/21/16 0825  TempSrc: Oral                 Ciro Tashiro S

## 2016-09-21 NOTE — Transfer of Care (Signed)
Immediate Anesthesia Transfer of Care Note  Patient: Larry Clements  Procedure(s) Performed: Procedure(s): UPPER ENDOSCOPIC ULTRASOUND (EUS) LINEAR (N/A)  Patient Location: PACU  Anesthesia Type:MAC  Level of Consciousness: awake, alert  and oriented  Airway & Oxygen Therapy: Patient Spontanous Breathing and Patient connected to nasal cannula oxygen  Post-op Assessment: Report given to RN and Post -op Vital signs reviewed and stable  Post vital signs: Reviewed and stable  Last Vitals:  Vitals:   09/21/16 0645 09/21/16 0710  BP: (!) 193/101 (!) 178/94  Pulse: 68   Resp: 14   Temp: 36.9 C     Last Pain:  Vitals:   09/21/16 0645  TempSrc: Oral         Complications: No apparent anesthesia complications

## 2016-09-21 NOTE — Op Note (Signed)
[facility] -  Patient Name: Larry Clements Procedure Date: 09/21/2016 MRN: [id number] Attending MD: Milus Banister , MD Date of Birth: May 17, 1955 CSN: [id number] Age: 61 Admit Type: Outpatient Procedure:                Upper EUS Indications:              Large abdominal mass on MRI, incidentally noted                            initially by US done to check for RAS; no GI                            symptoms Providers:                Milus Banister, MD, Laverta Baltimore RN, RN,                            Cherylynn Ridges, Technician, Alan Mulder,                            Technician, Rosario Adie, CRNA Referring MD:             Stark Klein, MD Medicines:                Monitored Anesthesia Care Complications:            No immediate complications. Estimated blood loss:                            None. Estimated Blood Loss:     Estimated blood loss: none. Procedure:                Pre-Anesthesia Assessment:                           - Prior to the procedure, a History and Physical                            was performed, and patient medications and                            allergies were reviewed. The patient's tolerance of                            previous anesthesia was also reviewed. The risks                            and benefits of the procedure and the sedation                            options and risks were discussed with the patient.                            All questions were answered, and informed consent  was obtained. Prior Anticoagulants: The patient has                            taken no previous anticoagulant or antiplatelet                            agents. ASA Grade Assessment: II - A patient with                            mild systemic disease. After reviewing the risks                            and benefits, the patient was deemed in                            satisfactory condition to undergo the  procedure.                           After obtaining informed consent, the endoscope was                            passed under direct vision. Throughout the                            procedure, the patient's blood pressure, pulse, and                            oxygen saturations were monitored continuously. The                            was introduced through the mouth, and advanced to                            the antrum of the stomach. The [id number] ([id number])                            scope was introduced through the mouth, and                            advanced to the antrum of the stomach. The upper                            EUS was accomplished without difficulty. The                            patient tolerated the procedure well. Findings:      Endoscopic Finding :      The examined esophagus was endoscopically normal.      The entire examined stomach was endoscopically normal.      The examined duodenum was endoscopically normal.      Endosonographic Finding :      1. Large hypoechoic, heterogeneous mass that directly abuts the gastric       wall. The mass appears to involve the muscularis propria layer of the  gastric wall. The mass measures at least 8.5cm on this exam. Fine needle       aspiration for cytology was performed. Color Doppler imaging was       utilized prior to needle puncture to confirm a lack of significant       vascular structures within the needle path. Two passes were made with       the 22 gauge needle using a transgastric approach. A stylet was used. A       cytotechnologist was present to evaluate the adequacy of the specimen.      2. The mass causes some anatomic distortion but limited views of liver,       spleen, pancreas were all normal. Impression:               - Large (8.5cm on this exam) abdominal mass which                            directly abuts the gastric wall and appears to                            communicate with the  muscularis propria layer of                            the stomach. Preliminary cytology review shows                            spindle shaped cells. 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 Moderate Sedation:      N/A- Per Anesthesia Care Recommendation:           - Discharge patient to home (ambulatory).                           - Await pathology results. Procedure Code(s):        --- Professional ---                           [phone], 47, Esophagogastroduodenoscopy, flexible,                            transoral; with transendoscopic ultrasound-guided                            intramural or transmural fine needle                            aspiration/biopsy(s), (includes endoscopic                            ultrasound examination limited to the esophagus,                            stomach or duodenum, and adjacent structures) Diagnosis Code(s):        --- Professional ---  K31.89, Other diseases of stomach and duodenum                           R93.8, Abnormal findings on diagnostic imaging of                            other specified body structures CPT copyright 2016 American Medical Association. All rights reserved. The codes documented in this report are preliminary and upon coder review may  be revised to meet current compliance requirements. Milus Banister, MD 09/21/2016 8:16:25 AM This report has been signed electronically. Number of Addenda: 0

## 2016-09-21 NOTE — Discharge Instructions (Signed)

## 2016-09-21 NOTE — H&P (Signed)
  HPI: This is a 61 yo man  Chief complaint is abdominal mass  Noted on [place] and MRI  ROS: complete GI ROS as described in HPI.  Constitutional:  No unintentional weight loss   Past Medical History:  Diagnosis Date  . Allergy   . HSV-2 (herpes simplex virus 2) infection   . Hypertension     Past Surgical History:  Procedure Laterality Date  . COLONOSCOPY    . SURGICAL EXCISION OF EXCESSIVE SKIN     fatty tissue    Current Facility-Administered Medications  Medication Dose Route Frequency Provider Last Rate Last Dose  . 0.9 %  sodium chloride infusion   Intravenous Continuous Milus Banister, MD      . lactated ringers infusion   Intravenous Continuous Milus Banister, MD        Allergies as of 09/13/2016  . (No Known Allergies)    Family History  Problem Relation Age of Onset  . Diabetes Father   . Kidney failure Father   . Colon cancer Father   . Hypertension Mother     Social History   Social History  . Marital status: Single    Spouse name: N/A  . Number of children: N/A  . Years of education: N/A   Occupational History  . Not on file.   Social History Main Topics  . Smoking status: Never Smoker  . Smokeless tobacco: Never Used  . Alcohol use No  . Drug use: No  . Sexual activity: Not on file   Other Topics Concern  . Not on file   Social History Narrative  . No narrative on file     Physical Exam: BP (!) 178/94   Pulse 68   Temp 98.4 F (36.9 C) (Oral)   Resp 14   Ht 6\' 4"  (1.93 m)   Wt 255 lb (115.7 kg)   SpO2 100%   BMI 31.04 kg/m  Constitutional: generally well-appearing Psychiatric: alert and oriented x3 Abdomen: soft, nontender, nondistended, no obvious ascites, no peritoneal signs, normal bowel sounds No peripheral edema noted in lower extremities  Assessment and plan: 61 y.o. male with large abdominal mass,   For upper EUS with FNA today.  Please see the "Patient Instructions" section for addition details about the  plan.  Owens Loffler, MD [facility] 09/21/2016, 7:13 AM

## 2016-09-21 NOTE — Anesthesia Preprocedure Evaluation (Addendum)
Anesthesia Evaluation  Patient identified by MRN, date of birth, ID band Patient awake    Reviewed: Allergy & Precautions, NPO status , Patient's Chart, lab work & pertinent test results  Airway Mallampati: II  TM Distance: >3 FB Neck ROM: Full    Dental no notable dental hx.    Pulmonary neg pulmonary ROS,    Pulmonary exam normal breath sounds clear to auscultation       Cardiovascular hypertension, Normal cardiovascular exam Rhythm:Regular Rate:Normal     Neuro/Psych negative neurological ROS  negative psych ROS   GI/Hepatic negative GI ROS, Neg liver ROS,   Endo/Other  negative endocrine ROS  Renal/GU negative Renal ROS  negative genitourinary   Musculoskeletal negative musculoskeletal ROS (+)   Abdominal   Peds negative pediatric ROS (+)  Hematology negative hematology ROS (+)   Anesthesia Other Findings   Reproductive/Obstetrics negative OB ROS                             Anesthesia Physical Anesthesia Plan  ASA: II  Anesthesia Plan: MAC   Post-op Pain Management:    Induction: Intravenous  Airway Management Planned: Nasal Cannula  Additional Equipment:   Intra-op Plan:   Post-operative Plan:   Informed Consent: I have reviewed the patients History and Physical, chart, labs and discussed the procedure including the risks, benefits and alternatives for the proposed anesthesia with the patient or authorized representative who has indicated his/her understanding and acceptance.   Dental advisory given  Plan Discussed with: CRNA and Surgeon  Anesthesia Plan Comments:         Anesthesia Quick Evaluation  

## 2016-09-22 ENCOUNTER — Encounter (HOSPITAL_COMMUNITY): Payer: Self-pay | Admitting: Gastroenterology

## 2016-09-25 ENCOUNTER — Other Ambulatory Visit: Payer: Self-pay | Admitting: General Surgery

## 2016-10-08 ENCOUNTER — Other Ambulatory Visit: Payer: Self-pay | Admitting: Internal Medicine

## 2016-10-13 ENCOUNTER — Other Ambulatory Visit: Payer: Self-pay | Admitting: Internal Medicine

## 2016-10-13 ENCOUNTER — Other Ambulatory Visit: Payer: Self-pay | Admitting: General Surgery

## 2016-10-16 NOTE — Anesthesia Postprocedure Evaluation (Signed)
Anesthesia Post Note  Patient: Larry Clements  Procedure(s) Performed: Procedure(s) (LRB): UPPER ENDOSCOPIC ULTRASOUND (EUS) LINEAR (N/A)     Anesthesia Post Evaluation  Last Vitals:  Vitals:   09/21/16 0850 09/21/16 0855  BP: (!) 187/104   Pulse: 73 71  Resp: 15 16  Temp:      Last Pain:  Vitals:   09/25/16 1522  TempSrc:   PainSc: 0-No pain                 [name]

## 2016-10-16 NOTE — Addendum Note (Signed)
Addendum  created 10/16/16 1431 by Myrtie Soman, MD   Sign clinical note

## 2016-10-19 NOTE — Pre-Procedure Instructions (Signed)
[place]  10/19/2016      [facility] - [place], [place] [address] Phone: [phone] Fax: [phone]  [facility] [place], [place] [address] [place] [address] Phone: [phone] Fax: [phone]  [id number] - [place], [place] [address] Phone: [phone] Fax: [phone]    Your procedure is scheduled on October 26, 2016.  Report to [facility] Admitting at 530 AM.  Call this number if you have problems the morning of surgery:  [phone]   Remember:  Do not eat food or drink liquids after midnight.  Take these medicines the morning of surgery with A SIP OF WATER amlodipine (norvasc), labetalol (normodyne).  7 days prior to surgery STOP taking any Aspirin, Aleve, Naproxen, Ibuprofen, Motrin, Advil, Goody's, BC's, all herbal medications, fish oil, and all vitamins   Do not wear jewelry, make-up or nail polish.  Do not wear lotions, powders, or perfumes, or deoderant.  Do not shave 48 hours prior to surgery.  Men may shave face and neck.  Do not bring valuables to the hospital.  [facility] is not responsible for any belongings or valuables.  Contacts, dentures or bridgework may not be worn into surgery.  Leave your suitcase in the car.  After surgery it may be brought to your room.  For patients admitted to the hospital, discharge time will be determined by your treatment team.  Patients discharged the day of surgery will not be allowed to drive home.    Special instructions:   [facility]- Preparing For Surgery  Before surgery, you can play an important role. Because skin is not sterile, your skin needs to be as free of germs as possible. You can reduce the number of germs on your skin by washing with CHG (chlorahexidine gluconate) Soap before surgery.  CHG is  an antiseptic cleaner which kills germs and bonds with the skin to continue killing germs even after washing.  Please do not use if you have an allergy to CHG or antibacterial soaps. If your skin becomes reddened/irritated stop using the CHG.  Do not shave (including legs and underarms) for at least 48 hours prior to first CHG shower. It is OK to shave your face.  Please follow these instructions carefully.   1. Shower the NIGHT BEFORE SURGERY and the MORNING OF SURGERY with CHG.   2. If you chose to wash your hair, wash your hair first as usual with your normal shampoo.  3. After you shampoo, rinse your hair and body thoroughly to remove the shampoo.  4. Use CHG as you would any other liquid soap. You can apply CHG directly to the skin and wash gently with a scrungie or a clean washcloth.   5. Apply the CHG Soap to your body ONLY FROM THE NECK DOWN.  Do not use on open wounds or open sores. Avoid contact with your eyes, ears, mouth and genitals (private parts). Wash genitals (private parts) with your normal soap.  6. Wash thoroughly, paying special attention to the area where your surgery will be performed.  7. Thoroughly rinse your body with warm water from the neck down.  8. DO NOT shower/wash with your normal soap after using and rinsing off the CHG Soap.  9. Pat yourself dry with a CLEAN TOWEL.  10. Wear CLEAN PAJAMAS   11. Place CLEAN SHEETS on your bed the night of your first shower and DO NOT SLEEP WITH PETS.    Day of Surgery: Do not apply any deodorants/lotions. Please wear clean clothes to the hospital/surgery center.     Please read over the following fact sheets that you were given. Pain Booklet, Coughing and Deep Breathing and Surgical Site Infection Prevention

## 2016-10-20 ENCOUNTER — Encounter (HOSPITAL_COMMUNITY)
Admission: RE | Admit: 2016-10-20 | Discharge: 2016-10-20 | Disposition: A | Discharge status: Home / Self Care | Payer: BLUE CROSS/BLUE SHIELD | Source: Ambulatory Visit | Attending: General Surgery | Admitting: General Surgery

## 2016-10-20 ENCOUNTER — Encounter (HOSPITAL_COMMUNITY): Payer: Self-pay

## 2016-10-20 ENCOUNTER — Telehealth: Payer: Self-pay | Admitting: Internal Medicine

## 2016-10-20 ENCOUNTER — Ambulatory Visit (HOSPITAL_COMMUNITY)
Admission: RE | Admit: 2016-10-20 | Discharge: 2016-10-20 | Disposition: A | Discharge status: Home / Self Care | Payer: BLUE CROSS/BLUE SHIELD | Source: Ambulatory Visit | Attending: General Surgery | Admitting: General Surgery

## 2016-10-20 DIAGNOSIS — Z01810 Encounter for preprocedural cardiovascular examination: Secondary | ICD-10-CM | POA: Insufficient documentation

## 2016-10-20 DIAGNOSIS — R9431 Abnormal electrocardiogram [ECG] [EKG]: Secondary | ICD-10-CM | POA: Insufficient documentation

## 2016-10-20 DIAGNOSIS — K3189 Other diseases of stomach and duodenum: Secondary | ICD-10-CM | POA: Insufficient documentation

## 2016-10-20 DIAGNOSIS — Z01818 Encounter for other preprocedural examination: Secondary | ICD-10-CM | POA: Diagnosis present

## 2016-10-20 DIAGNOSIS — Z01812 Encounter for preprocedural laboratory examination: Secondary | ICD-10-CM | POA: Diagnosis present

## 2016-10-20 HISTORY — DX: Chronic kidney disease, stage 3 unspecified: N18.30

## 2016-10-20 HISTORY — DX: Chronic kidney disease, stage 3 (moderate): N18.3

## 2016-10-20 LAB — CBC WITH DIFFERENTIAL/PLATELET
Basophils Absolute: 0 10*3/uL (ref 0.0–0.1)
Basophils Relative: 0 %
EOS PCT: 5 %
Eosinophils Absolute: 0.2 10*3/uL (ref 0.0–0.7)
HEMATOCRIT: 41.3 % (ref 39.0–52.0)
Hemoglobin: 13.3 g/dL (ref 13.0–17.0)
LYMPHS PCT: 21 %
Lymphs Abs: 1 10*3/uL (ref 0.7–4.0)
MCH: 27.8 pg (ref 26.0–34.0)
MCHC: 32.2 g/dL (ref 30.0–36.0)
MCV: 86.2 fL (ref 78.0–100.0)
MONO ABS: 0.5 10*3/uL (ref 0.1–1.0)
MONOS PCT: 9 %
Neutro Abs: 3.3 10*3/uL (ref 1.7–7.7)
Neutrophils Relative %: 65 %
PLATELETS: 227 10*3/uL (ref 150–400)
RBC: 4.79 MIL/uL (ref 4.22–5.81)
RDW: 13.9 % (ref 11.5–15.5)
WBC: 5.1 10*3/uL (ref 4.0–10.5)

## 2016-10-20 LAB — BASIC METABOLIC PANEL
Anion gap: 7 (ref 5–15)
BUN: 22 mg/dL — AB (ref 6–20)
CO2: 25 mmol/L (ref 22–32)
Calcium: 9.3 mg/dL (ref 8.9–10.3)
Chloride: 107 mmol/L (ref 101–111)
Creatinine, Ser: 1.85 mg/dL — ABNORMAL HIGH (ref 0.61–1.24)
GFR calc Af Amer: 44 mL/min — ABNORMAL LOW (ref >60)
GFR, EST NON AFRICAN AMERICAN: 38 mL/min — AB (ref >60)
GLUCOSE: 101 mg/dL — AB (ref 65–99)
Potassium: 3.5 mmol/L (ref 3.5–5.1)
Sodium: 139 mmol/L (ref 135–145)

## 2016-10-20 LAB — PROTIME-INR
INR: 1.07
Prothrombin Time: 13.9 seconds (ref 11.4–15.2)

## 2016-10-20 MED ORDER — CHLORHEXIDINE GLUCONATE CLOTH 2 % EX PADS: 6.0000 | MEDICATED_PAD | Freq: Once | CUTANEOUS | Status: DC

## 2016-10-20 NOTE — Telephone Encounter (Addendum)
Patient called to give you the surgery date.  He will have surgery on 10/26/16 @ 5:30 a.m. At [place].    10/24/2016-he is scheduled for surgery with Dr. Barry Dienes June 14. He brings by disability form for work which needs to be completed. Form was completed today and he may pick it up at his convenience. Copy of form in Epic

## 2016-10-23 ENCOUNTER — Encounter (HOSPITAL_COMMUNITY): Payer: Self-pay

## 2016-10-23 ENCOUNTER — Telehealth: Payer: Self-pay | Admitting: Internal Medicine

## 2016-10-23 NOTE — Telephone Encounter (Signed)
Patient has his disability papers and wants to know if you want him to bring them by to be filled out.

## 2016-10-23 NOTE — Telephone Encounter (Signed)
Pt is aware.  

## 2016-10-23 NOTE — H&P (Signed)
[place] Location: [facility] Patient #: [id number] DOB: 07/25/1955 Separated / Language: Cleophus Molt / Race: Refused to Report/Unreported Male   History of Present Illness  The patient is a 61 year old male who presents for a follow-up for Abdominal mass. Referred by Dr. Jamal Maes for 10 cm left upper quadrant intra-abdominal mass PCP - Dr. Tommie Ard Baxley  This is a 61 year old male with a history of hypertension who was recently referred to Dr. Lorrene Reid for poorly controlled hypertension and a family history of chronic kidney disease. As part of his renal evaluation, an ultrasound was performed. This showed incidental finding of a 9 cm mass in the left upper quadrant. MRI was recommended. MRI was performed on 08/17/16. This showed a 10.3 x 5.8 x 8.0 cm mass interposed between the stomach and the spleen and tracking along the upper margin of the pancreatic tail.  Colonoscopy 2016 Dr. Fuller Plan - completely normal  Patient is referred to me from Dr. Georgette Dover . We reviewed his imaging at GI conference this morning. It appears to likely be from the wall of the stomach. There are 1-2 images that are suggestive of a vessel going between the wall of the stomach and the mass. We discussed the endoscopic ultrasound would be the best way to tell for sure if this is in the wall of the stomach.   The patient remains asymptomatic. He has been working on his blood pressure control.  EXAM: MRI ABDOMEN WITHOUT AND WITH CONTRAST  TECHNIQUE: Multiplanar multisequence MR imaging of the abdomen was performed both before and after the administration of intravenous contrast.  CONTRAST: 34mL MULTIHANCE GADOBENATE DIMEGLUMINE 529 MG/ML IV SOLN  Half dose was given due to renal insufficiency.  Creatinine was obtained on site at [facility] at [address]  Results: Creatinine 2.0 mg/dL.  COMPARISON: 07/31/2016  FINDINGS: Despite efforts by the technologist and  patient, motion artifact is present on today's exam and could not be eliminated. This reduces exam sensitivity and specificity.  Lower chest: Unremarkable  Hepatobiliary: Unremarkable  Pancreas: The left upper quadrant mass appears to displace the pancreatic tail inferiorly for example on image 19/4. I am skeptical that this mass is truly arising from the pancreas although clearly there closely associated. No pancreatic duct dilatation. The pancreas appears otherwise normal.  Spleen: The left upper quadrant mass is adjacent to the splenic hilum but does not appear to be arising from the spleen. Spleen normal.  Adrenals/Urinary Tract: The left upper quadrant massive immediately adjacent to the lateral limb left adrenal gland as on image 37/15 but is not arising from the adrenal gland.  Multiple fluid signal intensity lesions of both kidneys are present, likely representing simple cysts. None of these appear to demonstrate high T1 signal characteristics precontrast. There is a cluster of 3 small cystic lesions in the right mid to lower kidney peripherally on image 20 of series 9 which I categorized as Bosniak category 11F due to questionable marginal enhancement for example images 20 through 27 of series 19. Some of these lesions are technically too small to characterize based on the degree of motion artifact.  Stomach/Bowel: The left upper quadrant mass abuts the stomach fundus.  Vascular/Lymphatic: A peripancreatic node measures 1.6 cm in short axis on image 51/20, sitting just above Lee pancreatic body. Otherwise unremarkable.  Other: In the left upper quadrant interposed between the stomach and spleen there is a 10.3 by 5.8 by 8.0 cm (volume = 250 cm^3) mass with  mildly heterogeneous but low T1 signal characteristics and intermediate to high T2 signal characteristics with internal reticulonodular architecture of even higher T2 signal characteristics. There is diffuse if  somewhat low-level enhancement in this mass.  Musculoskeletal: Degenerative disc disease and spondylosis at L5-S1. Small umbilical hernia contains adipose tissue.  IMPRESSION: 1. A 10.3 cm mass in the left upper quadrant adjacent to the gastric fundus interposed between the stomach and spleen, and tracking along the upper margin of the pancreatic tail has low T1 and high T2 signal characteristics with diffuse enhancement. Based on the slight heterogeneity I favor a GI stromal tumor. Differential diagnostic considerations may include lymphoma, gastrointestinal carcinoid or schwannoma, or possibly a or air gastrointestinal leiomyosarcoma. I am skeptical of an adrenal origin, the mass abuts but does not appear to be arising from the adrenal gland lateral limb. Surgical referral recommended. 2. Bosniak category 2F lesion of the right kidney lateral mid to lower pole. This cystic lesion is probably benign but warrants surveillance. Renal protocol MRI with and without contrast recommended in 6 months time to reassess. This recommendation follows ACR consensus guidelines: Management of the Incidental Renal Mass on CT: A White Paper of the ACR Incidental Findings Committee. J Am Coll Radiol 0076;22:633-354. 3. Small umbilical hernia contains adipose tissue. 4. 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   Electronically Signed By: Van Clines M.D. On: 08/18/2016 08:39   Past Surgical History No pertinent past surgical history   Diagnostic Studies History Colonoscopy  1-5 years ago  Allergies No Known Drug Allergies 09/05/2016 Allergies Reconciled   Medication History AmLODIPine Besylate (10MG  Tablet, Oral) Active. Furosemide (40MG  Tablet, Oral) Active. Klor-Con M20 [facility] Tablet ER, Oral) Active. Losartan Potassium (100MG  Tablet, Oral) Active. Labetalol HCl (300MG  Tablet, Oral) Active. Medications Reconciled  Family History Hypertension   Mother. Kidney Disease  Father.  Other Problems  High blood pressure     Review of Systems  Skin Present- New Lesions. Not Present- Change in Wart/Mole, Dryness, Hives, Jaundice, Non-Healing Wounds, Rash and Ulcer. All other systems negative  Vitals   Weight: 258.2 lb Height: 76in Body Surface Area: 2.47 m Body Mass Index: 31.43 kg/m  Temp.: 98.67F  Pulse: 76 (Regular)  P.OX: 97% (Room air) BP: 160/100 (Sitting, Left Arm, Standard)       Physical Exam General Mental Status-Alert. General Appearance-Consistent with stated age. Hydration-Well hydrated. Voice-Normal.  Head and Neck Head-normocephalic, atraumatic with no lesions or palpable masses.  Eye Sclera/Conjunctiva - Bilateral-No scleral icterus.  Chest and Lung Exam Chest and lung exam reveals -quiet, even and easy respiratory effort with no use of accessory muscles. Inspection Chest Wall - Normal. Back - normal.  Breast - Did not examine.  Cardiovascular Cardiovascular examination reveals -normal pedal pulses bilaterally. Note: regular rate and rhythm  Abdomen Inspection-Inspection Normal. Palpation/Percussion Palpation and Percussion of the abdomen reveal - Soft, Non Tender, No Rebound tenderness, No Rigidity (guarding) and No hepatosplenomegaly. Note: tiny umbilical hernia   Peripheral Vascular Upper Extremity Inspection - Bilateral - Normal - No Clubbing, No Cyanosis, No Edema, Pulses Intact. Lower Extremity Palpation - Edema - Bilateral - No edema.  Neurologic Neurologic evaluation reveals -alert and oriented x 3 with no impairment of recent or remote memory. Mental Status-Normal.  Musculoskeletal Global Assessment -Note: no gross deformities.  Normal Exam - Left-Upper Extremity Strength Normal and Lower Extremity Strength Normal. Normal Exam - Right-Upper Extremity Strength Normal and Lower Extremity Strength Normal.  Lymphatic Head &  Neck  General Head & Neck Lymphatics:  Bilateral - Description - Normal. Axillary  General Axillary Region: Bilateral - Description - Normal. Tenderness - Non Tender.    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 Secondary considerations would be a lymphoma. The patient does not have any B symptoms to suggest lymphoma.  He is set up for an endoscopic ultrasound next week with FNA. Assuming that we see spindle cells and it is in the wall of the stomach, we would plan a laparoscopic partial gastrectomy. I discussed this with the patient. I reviewed that we would need one incision large enough to take the mass out and to use the stapler. I discussed the risk of this include bleeding, infection, gastric leak, damage to adjacent structures, cancer recurrence, possible need for additional treatment or surgery, death, others.  If we have unexpected findings on the cytology, he may need a percutaneous core needle biopsy. He also has a prominent portal lymph node. This is going to hopefully be sampled during EUS as well.  I discussed with the patient that we may have surprising findings that may lead to a change in our surgical plan. I advised the patient that if we need to make a change in our surgical plan, that he will need to come back for another appointment to discuss. Current Plans Pt Education - [facility] Free Text Education/Instructions: discussed with patient and provided information.   Signed by Stark Klein, MD

## 2016-10-23 NOTE — Telephone Encounter (Signed)
OK but will have to leave them until I have time to do it later

## 2016-10-23 NOTE — Progress Notes (Signed)
Anesthesia Chart Review:  Pt is a 61 year old male scheduled for laparoscopic partial gastrectomy on 10/26/2016 with Neta Ehlers, M.D.  - PCP is Tedra Senegal, MD - Nephrologist is Jamal Maes, MD  PMH includes: HTN, CKD (stage 3). Never smoker. BMI 31.5.  Medications include: Amlodipine, Lasix, potassium, labetalol, losartan.  BP (!) 170/88 Comment: right arm  Pulse 86   Temp 36.9 C   Resp 20   Ht 6\' 4"  (1.93 m)   Wt 259 lb (117.5 kg)   SpO2 98%   BMI 31.53 kg/m   Preoperative labs reviewed.  Cr 1.85, BUN 22. Based on records from Dr. Lorrene Reid (in media tab), recent Cr appears to be ~1.7.    CXR 10/20/16: No active cardiopulmonary disease.  EKG 10/20/16: NSR. Possible Inferior infarct, age undetermined  Renal US 07/31/16:  1. No evidence of renal atrophy, urinary obstruction or evidence of renal artery stenosis. 2. **An incidental finding of potential clinical significance has been found. Approximately 9 cm mass within the left upper abdominal quadrant - while indeterminate on this examination, this structure appears separate from the spleen and kidney and thus may be arising from the adrenal gland. Further evaluation with abdominal MRI is recommended.** 3. Bilateral renal cysts. 4. Suspected punctate (approximately 4 mm) nonobstructing left-sided renal stone. 5. Enlarged prostate with mass effect upon the undersurface of the urinary bladder.  If no changes, I anticipate pt can proceed with surgery as scheduled.   Willeen Cass, FNP-BC [facility]/Anesthesiology Phone: [phone] 10/23/2016 10:15 AM

## 2016-10-24 ENCOUNTER — Encounter: Payer: Self-pay | Admitting: Internal Medicine

## 2016-10-25 MED ORDER — DEXTROSE 5 % IV SOLN
3.0000 g | INTRAVENOUS | Status: AC
  Administered 2016-10-26: 3 g via INTRAVENOUS
  Filled 2016-10-25: qty 3000

## 2016-10-26 ENCOUNTER — Encounter (HOSPITAL_COMMUNITY)
Admission: RE | Disposition: A | Discharge status: Home / Self Care | Payer: Self-pay | Source: Ambulatory Visit | Attending: General Surgery

## 2016-10-26 ENCOUNTER — Encounter (HOSPITAL_COMMUNITY): Payer: Self-pay | Admitting: *Deleted

## 2016-10-26 ENCOUNTER — Inpatient Hospital Stay (HOSPITAL_COMMUNITY): Payer: BLUE CROSS/BLUE SHIELD | Admitting: Emergency Medicine

## 2016-10-26 ENCOUNTER — Inpatient Hospital Stay (HOSPITAL_COMMUNITY): Payer: BLUE CROSS/BLUE SHIELD | Admitting: Certified Registered Nurse Anesthetist

## 2016-10-26 ENCOUNTER — Inpatient Hospital Stay (HOSPITAL_COMMUNITY)
Admission: RE | Admit: 2016-10-26 | Discharge: 2016-10-29 | DRG: 544 | Disposition: A | Discharge status: Home / Self Care | Payer: BLUE CROSS/BLUE SHIELD | Source: Ambulatory Visit | Attending: General Surgery | Admitting: General Surgery

## 2016-10-26 DIAGNOSIS — M479 Spondylosis, unspecified: Secondary | ICD-10-CM | POA: Diagnosis present

## 2016-10-26 DIAGNOSIS — I10 Essential (primary) hypertension: Secondary | ICD-10-CM | POA: Diagnosis present

## 2016-10-26 DIAGNOSIS — C49A2 Gastrointestinal stromal tumor of stomach: Secondary | ICD-10-CM | POA: Diagnosis present

## 2016-10-26 DIAGNOSIS — R1902 Left upper quadrant abdominal swelling, mass and lump: Secondary | ICD-10-CM | POA: Diagnosis present

## 2016-10-26 DIAGNOSIS — K429 Umbilical hernia without obstruction or gangrene: Secondary | ICD-10-CM | POA: Diagnosis present

## 2016-10-26 DIAGNOSIS — M5137 Other intervertebral disc degeneration, lumbosacral region: Secondary | ICD-10-CM | POA: Diagnosis present

## 2016-10-26 HISTORY — PX: LAPAROSCOPIC PARTIAL GASTRECTOMY: SHX1933

## 2016-10-26 HISTORY — DX: Gastrointestinal stromal tumor, unspecified site: C49.A0

## 2016-10-26 HISTORY — PX: LAPAROSCOPIC GASTRECTOMY: SHX5894

## 2016-10-26 LAB — CBC
HCT: 40.8 % (ref 39.0–52.0)
HEMOGLOBIN: 12.8 g/dL — AB (ref 13.0–17.0)
MCH: 27.4 pg (ref 26.0–34.0)
MCHC: 31.4 g/dL (ref 30.0–36.0)
MCV: 87.2 fL (ref 78.0–100.0)
Platelets: 229 10*3/uL (ref 150–400)
RBC: 4.68 MIL/uL (ref 4.22–5.81)
RDW: 13.8 % (ref 11.5–15.5)
WBC: 8.9 10*3/uL (ref 4.0–10.5)

## 2016-10-26 LAB — CREATININE, SERUM
CREATININE: 1.95 mg/dL — AB (ref 0.61–1.24)
GFR, EST AFRICAN AMERICAN: 42 mL/min — AB (ref >60)
GFR, EST NON AFRICAN AMERICAN: 36 mL/min — AB (ref >60)

## 2016-10-26 SURGERY — GASTRECTOMY, LAPAROSCOPIC
Anesthesia: General | Site: Abdomen

## 2016-10-26 MED ORDER — OXYCODONE HCL 5 MG PO TABS
5.0000 mg | ORAL_TABLET | ORAL | Status: DC | PRN
  Administered 2016-10-27 (×2): 10 mg via ORAL
  Administered 2016-10-28: 5 mg via ORAL
  Filled 2016-10-26: qty 2
  Filled 2016-10-26: qty 1
  Filled 2016-10-26 (×2): qty 2

## 2016-10-26 MED ORDER — ROCURONIUM BROMIDE 10 MG/ML (PF) SYRINGE
PREFILLED_SYRINGE | INTRAVENOUS | Status: AC
  Filled 2016-10-26: qty 5

## 2016-10-26 MED ORDER — PHENYLEPHRINE HCL 10 MG/ML IJ SOLN
INTRAVENOUS | Status: DC | PRN
  Administered 2016-10-26: 10 ug/min via INTRAVENOUS

## 2016-10-26 MED ORDER — ACETAMINOPHEN 650 MG RE SUPP: 650.0000 mg | Freq: Four times a day (QID) | RECTAL | Status: DC | PRN

## 2016-10-26 MED ORDER — PANTOPRAZOLE SODIUM 40 MG IV SOLR
40.0000 mg | Freq: Every day | INTRAVENOUS | Status: DC
  Administered 2016-10-26 – 2016-10-27 (×2): 40 mg via INTRAVENOUS
  Filled 2016-10-26 (×2): qty 40

## 2016-10-26 MED ORDER — ONDANSETRON HCL 4 MG/2ML IJ SOLN: 4.0000 mg | Freq: Once | INTRAMUSCULAR | Status: DC | PRN

## 2016-10-26 MED ORDER — POTASSIUM CHLORIDE CRYS ER 20 MEQ PO TBCR
40.0000 meq | EXTENDED_RELEASE_TABLET | Freq: Every day | ORAL | Status: DC
  Administered 2016-10-26 – 2016-10-29 (×4): 40 meq via ORAL
  Filled 2016-10-26 (×4): qty 2

## 2016-10-26 MED ORDER — FENTANYL CITRATE (PF) 250 MCG/5ML IJ SOLN
INTRAMUSCULAR | Status: AC
  Filled 2016-10-26: qty 5

## 2016-10-26 MED ORDER — HYDROMORPHONE HCL 1 MG/ML IJ SOLN
INTRAMUSCULAR | Status: AC
  Filled 2016-10-26: qty 0.5

## 2016-10-26 MED ORDER — ACETAMINOPHEN 325 MG PO TABS
650.0000 mg | ORAL_TABLET | Freq: Four times a day (QID) | ORAL | Status: DC | PRN
  Administered 2016-10-28: 650 mg via ORAL
  Filled 2016-10-26: qty 2

## 2016-10-26 MED ORDER — LIDOCAINE 2% (20 MG/ML) 5 ML SYRINGE
INTRAMUSCULAR | Status: DC | PRN
  Administered 2016-10-26: 100 mg via INTRAVENOUS

## 2016-10-26 MED ORDER — DIPHENHYDRAMINE HCL 12.5 MG/5ML PO ELIX: 12.5000 mg | ORAL_SOLUTION | Freq: Four times a day (QID) | ORAL | Status: DC | PRN

## 2016-10-26 MED ORDER — HYDRALAZINE HCL 20 MG/ML IJ SOLN: 10.0000 mg | INTRAMUSCULAR | Status: DC | PRN

## 2016-10-26 MED ORDER — LACTATED RINGERS IV SOLN
INTRAVENOUS | Status: DC | PRN
  Administered 2016-10-26 (×2): via INTRAVENOUS

## 2016-10-26 MED ORDER — LABETALOL HCL 100 MG PO TABS
300.0000 mg | ORAL_TABLET | Freq: Three times a day (TID) | ORAL | Status: DC
  Administered 2016-10-26 – 2016-10-29 (×9): 300 mg via ORAL
  Filled 2016-10-26 (×9): qty 3

## 2016-10-26 MED ORDER — PROPOFOL 10 MG/ML IV BOLUS
INTRAVENOUS | Status: DC | PRN
Start: 2016-10-26 — End: 2016-10-26
  Administered 2016-10-26: 100 mg via INTRAVENOUS
  Administered 2016-10-26: 20 mg via INTRAVENOUS

## 2016-10-26 MED ORDER — ONDANSETRON HCL 4 MG/2ML IJ SOLN
INTRAMUSCULAR | Status: DC | PRN
  Administered 2016-10-26: 4 mg via INTRAVENOUS

## 2016-10-26 MED ORDER — ZOLPIDEM TARTRATE 5 MG PO TABS: 5.0000 mg | ORAL_TABLET | Freq: Every evening | ORAL | Status: DC | PRN

## 2016-10-26 MED ORDER — BUPIVACAINE-EPINEPHRINE (PF) 0.25% -1:200000 IJ SOLN
INTRAMUSCULAR | Status: AC
  Filled 2016-10-26: qty 30

## 2016-10-26 MED ORDER — SODIUM CHLORIDE 0.9 % IR SOLN
Status: DC | PRN
  Administered 2016-10-26: 1000 mL

## 2016-10-26 MED ORDER — ONDANSETRON 4 MG PO TBDP: 4.0000 mg | ORAL_TABLET | Freq: Four times a day (QID) | ORAL | Status: DC | PRN

## 2016-10-26 MED ORDER — METHOCARBAMOL 500 MG PO TABS
500.0000 mg | ORAL_TABLET | Freq: Four times a day (QID) | ORAL | Status: DC | PRN
  Administered 2016-10-28: 500 mg via ORAL
  Filled 2016-10-26: qty 1

## 2016-10-26 MED ORDER — 0.9 % SODIUM CHLORIDE (POUR BTL) OPTIME
TOPICAL | Status: DC | PRN
  Administered 2016-10-26 (×2): 1000 mL

## 2016-10-26 MED ORDER — LIDOCAINE HCL (PF) 1 % IJ SOLN
INTRAMUSCULAR | Status: AC
  Filled 2016-10-26: qty 30

## 2016-10-26 MED ORDER — MIDAZOLAM HCL 5 MG/5ML IJ SOLN
INTRAMUSCULAR | Status: DC | PRN
  Administered 2016-10-26: 2 mg via INTRAVENOUS

## 2016-10-26 MED ORDER — FUROSEMIDE 40 MG PO TABS
40.0000 mg | ORAL_TABLET | Freq: Every day | ORAL | Status: DC
  Administered 2016-10-26 – 2016-10-29 (×4): 40 mg via ORAL
  Filled 2016-10-26 (×4): qty 1

## 2016-10-26 MED ORDER — ONDANSETRON HCL 4 MG/2ML IJ SOLN: 4.0000 mg | Freq: Four times a day (QID) | INTRAMUSCULAR | Status: DC | PRN

## 2016-10-26 MED ORDER — KCL IN DEXTROSE-NACL 20-5-0.45 MEQ/L-%-% IV SOLN
INTRAVENOUS | Status: DC
  Administered 2016-10-26: 75 mL/h via INTRAVENOUS
  Administered 2016-10-27 – 2016-10-29 (×4): via INTRAVENOUS
  Filled 2016-10-26 (×6): qty 1000

## 2016-10-26 MED ORDER — LIDOCAINE 2% (20 MG/ML) 5 ML SYRINGE
INTRAMUSCULAR | Status: AC
  Filled 2016-10-26: qty 5

## 2016-10-26 MED ORDER — ENOXAPARIN SODIUM 40 MG/0.4ML ~~LOC~~ SOLN
40.0000 mg | SUBCUTANEOUS | Status: DC
  Administered 2016-10-27 – 2016-10-28 (×2): 40 mg via SUBCUTANEOUS
  Filled 2016-10-26 (×2): qty 0.4

## 2016-10-26 MED ORDER — LOSARTAN POTASSIUM 50 MG PO TABS
100.0000 mg | ORAL_TABLET | Freq: Every day | ORAL | Status: DC
  Administered 2016-10-27 – 2016-10-29 (×3): 100 mg via ORAL
  Filled 2016-10-26 (×3): qty 2

## 2016-10-26 MED ORDER — LIDOCAINE HCL 1 % IJ SOLN
INTRAMUSCULAR | Status: DC | PRN
  Administered 2016-10-26: 16 mL

## 2016-10-26 MED ORDER — PROPOFOL 10 MG/ML IV BOLUS
INTRAVENOUS | Status: AC
  Filled 2016-10-26: qty 20

## 2016-10-26 MED ORDER — DIPHENHYDRAMINE HCL 50 MG/ML IJ SOLN: 12.5000 mg | Freq: Four times a day (QID) | INTRAMUSCULAR | Status: DC | PRN

## 2016-10-26 MED ORDER — MIDAZOLAM HCL 2 MG/2ML IJ SOLN
INTRAMUSCULAR | Status: AC
  Filled 2016-10-26: qty 2

## 2016-10-26 MED ORDER — HYDROMORPHONE HCL 1 MG/ML IJ SOLN
0.2500 mg | INTRAMUSCULAR | Status: DC | PRN
  Administered 2016-10-26: 0.5 mg via INTRAVENOUS

## 2016-10-26 MED ORDER — ONDANSETRON HCL 4 MG/2ML IJ SOLN
INTRAMUSCULAR | Status: AC
  Filled 2016-10-26: qty 2

## 2016-10-26 MED ORDER — SUGAMMADEX SODIUM 200 MG/2ML IV SOLN
INTRAVENOUS | Status: AC
  Filled 2016-10-26: qty 2

## 2016-10-26 MED ORDER — CEFAZOLIN SODIUM-DEXTROSE 2-4 GM/100ML-% IV SOLN
2.0000 g | Freq: Three times a day (TID) | INTRAVENOUS | Status: AC
  Administered 2016-10-26: 2 g via INTRAVENOUS
  Filled 2016-10-26 (×2): qty 100

## 2016-10-26 MED ORDER — AMLODIPINE BESYLATE 10 MG PO TABS
10.0000 mg | ORAL_TABLET | Freq: Every day | ORAL | Status: DC
  Administered 2016-10-26 – 2016-10-29 (×4): 10 mg via ORAL
  Filled 2016-10-26 (×4): qty 1

## 2016-10-26 MED ORDER — MORPHINE SULFATE (PF) 4 MG/ML IV SOLN
1.0000 mg | INTRAVENOUS | Status: DC | PRN
  Administered 2016-10-26: 4 mg via INTRAVENOUS
  Administered 2016-10-27 (×3): 2 mg via INTRAVENOUS
  Administered 2016-10-28: 4 mg via INTRAVENOUS
  Filled 2016-10-26 (×5): qty 1

## 2016-10-26 MED ORDER — SUGAMMADEX SODIUM 200 MG/2ML IV SOLN
INTRAVENOUS | Status: DC | PRN
  Administered 2016-10-26: 200 mg via INTRAVENOUS

## 2016-10-26 MED ORDER — MEPERIDINE HCL 25 MG/ML IJ SOLN: 6.2500 mg | INTRAMUSCULAR | Status: DC | PRN

## 2016-10-26 MED ORDER — ROCURONIUM BROMIDE 10 MG/ML (PF) SYRINGE
PREFILLED_SYRINGE | INTRAVENOUS | Status: DC | PRN
  Administered 2016-10-26: 20 mg via INTRAVENOUS
  Administered 2016-10-26: 50 mg via INTRAVENOUS

## 2016-10-26 MED ORDER — FENTANYL CITRATE (PF) 100 MCG/2ML IJ SOLN
INTRAMUSCULAR | Status: DC | PRN
  Administered 2016-10-26: 150 ug via INTRAVENOUS
  Administered 2016-10-26: 50 ug via INTRAVENOUS

## 2016-10-26 SURGICAL SUPPLY — 80 items
ADH SKN CLS LQ APL DERMABOND (GAUZE/BANDAGES/DRESSINGS) ×1
APL SRG 32X5 SNPLK LF DISP (MISCELLANEOUS)
BAG SPEC RTRVL LRG 6X4 10 (ENDOMECHANICALS) ×1
BLADE CLIPPER SURG (BLADE) IMPLANT
CANISTER SUCT 3000ML PPV (MISCELLANEOUS) ×3 IMPLANT
CELLS DAT CNTRL 66122 CELL SVR (MISCELLANEOUS) ×1 IMPLANT
CHLORAPREP W/TINT 26ML (MISCELLANEOUS) ×3 IMPLANT
COVER SURGICAL LIGHT HANDLE (MISCELLANEOUS) ×3 IMPLANT
DERMABOND ADHESIVE PROPEN (GAUZE/BANDAGES/DRESSINGS) ×2
DERMABOND ADVANCED .7 DNX6 (GAUZE/BANDAGES/DRESSINGS) IMPLANT
DRAIN CHANNEL 19F RND (DRAIN) IMPLANT
DRAIN PENROSE 1/2X36 STERILE (WOUND CARE) ×2 IMPLANT
DRAPE UTILITY XL STRL (DRAPES) ×2 IMPLANT
DRAPE WARM FLUID 44X44 (DRAPE) ×3 IMPLANT
DRSG COVADERM 4X10 (GAUZE/BANDAGES/DRESSINGS) IMPLANT
DRSG COVADERM 4X14 (GAUZE/BANDAGES/DRESSINGS) IMPLANT
ELECT BLADE 6.5 EXT (BLADE) IMPLANT
ELECT CAUTERY BLADE 6.4 (BLADE) ×2 IMPLANT
ELECT REM PT RETURN 9FT ADLT (ELECTROSURGICAL) ×3
ELECTRODE REM PT RTRN 9FT ADLT (ELECTROSURGICAL) ×1 IMPLANT
EVACUATOR SILICONE 100CC (DRAIN) IMPLANT
GAUZE SPONGE 4X4 12PLY STRL (GAUZE/BANDAGES/DRESSINGS) IMPLANT
GLOVE BIO SURGEON STRL SZ 6 (GLOVE) ×3 IMPLANT
GLOVE BIOGEL PI IND STRL 6.5 (GLOVE) ×1 IMPLANT
GLOVE BIOGEL PI INDICATOR 6.5 (GLOVE) ×2
GOWN STRL REUS W/ TWL LRG LVL3 (GOWN DISPOSABLE) ×3 IMPLANT
GOWN STRL REUS W/TWL 2XL LVL3 (GOWN DISPOSABLE) ×3 IMPLANT
GOWN STRL REUS W/TWL LRG LVL3 (GOWN DISPOSABLE) ×9
HEMOSTAT SURGICEL 2X14 (HEMOSTASIS) IMPLANT
KIT BASIN OR (CUSTOM PROCEDURE TRAY) ×3 IMPLANT
KIT ROOM TURNOVER OR (KITS) ×3 IMPLANT
L-HOOK LAP DISP 36CM (ELECTROSURGICAL) ×3
LHOOK LAP DISP 36CM (ELECTROSURGICAL) ×1 IMPLANT
LOOP VESSEL MAXI BLUE (MISCELLANEOUS) IMPLANT
NS IRRIG 1000ML POUR BTL (IV SOLUTION) ×6 IMPLANT
PAD ARMBOARD 7.5X6 YLW CONV (MISCELLANEOUS) ×6 IMPLANT
PENCIL BUTTON HOLSTER BLD 10FT (ELECTRODE) ×3 IMPLANT
POUCH SPECIMEN RETRIEVAL 10MM (ENDOMECHANICALS) ×3 IMPLANT
RELOAD BLUE (STAPLE) IMPLANT
RELOAD STAPLE 60 2.6 WHT THN (STAPLE) IMPLANT
RELOAD STAPLE 60 3.6 BLU REG (STAPLE) IMPLANT
RELOAD STAPLER BLUE 60MM (STAPLE) ×3 IMPLANT
RELOAD STAPLER WHITE 60MM (STAPLE) IMPLANT
RETRACTOR WND ALEXIS 18 MED (MISCELLANEOUS) IMPLANT
RTRCTR WOUND ALEXIS 18CM MED (MISCELLANEOUS) ×3
SEALANT SURGICAL APPL DUAL CAN (MISCELLANEOUS) IMPLANT
SET IRRIG TUBING LAPAROSCOPIC (IRRIGATION / IRRIGATOR) ×2 IMPLANT
SHEARS HARMONIC ACE PLUS 36CM (ENDOMECHANICALS) ×3 IMPLANT
SLEEVE ENDOPATH XCEL 5M (ENDOMECHANICALS) ×6 IMPLANT
STAPLE ECHEON FLEX 60 POW ENDO (STAPLE) IMPLANT
STAPLER RELOAD BLUE 60MM (STAPLE) ×9
STAPLER RELOAD WHITE 60MM (STAPLE)
STAPLER STANDARD HANDLE (STAPLE) IMPLANT
STAPLER VASCULAR ECHELON 35 (CUTTER) IMPLANT
STAPLER VISISTAT 35W (STAPLE) IMPLANT
SUT ETHILON 2 0 FS 18 (SUTURE) IMPLANT
SUT MNCRL AB 4-0 PS2 18 (SUTURE) ×5 IMPLANT
SUT PDS AB 1 TP1 96 (SUTURE) ×4 IMPLANT
SUT PDS II 0 TP-1 LOOPED 60 (SUTURE) IMPLANT
SUT SILK 2 0 SH CR/8 (SUTURE) ×3 IMPLANT
SUT SILK 2 0 TIES 10X30 (SUTURE) ×3 IMPLANT
SUT SILK 3 0 SH CR/8 (SUTURE) ×3 IMPLANT
SUT SILK 3 0 TIES 10X30 (SUTURE) ×3 IMPLANT
SYS LAPSCP GELPORT 120MM (MISCELLANEOUS)
SYSTEM LAPSCP GELPORT 120MM (MISCELLANEOUS) IMPLANT
TIP INNERVISION DETACH 40FR (MISCELLANEOUS) IMPLANT
TIP INNERVISION DETACH 50FR (MISCELLANEOUS) IMPLANT
TIP INNERVISION DETACH 56FR (MISCELLANEOUS) IMPLANT
TIPS INNERVISION DETACH 40FR (MISCELLANEOUS)
TOWEL OR 17X24 6PK STRL BLUE (TOWEL DISPOSABLE) ×3 IMPLANT
TOWEL OR 17X26 10 PK STRL BLUE (TOWEL DISPOSABLE) ×3 IMPLANT
TRAY FOLEY W/METER SILVER 14FR (SET/KITS/TRAYS/PACK) ×3 IMPLANT
TRAY LAPAROSCOPIC MC (CUSTOM PROCEDURE TRAY) ×3 IMPLANT
TROCAR XCEL 12X100 BLDLESS (ENDOMECHANICALS) IMPLANT
TROCAR XCEL BLUNT TIP 100MML (ENDOMECHANICALS) ×2 IMPLANT
TROCAR XCEL NON-BLD 5MMX100MML (ENDOMECHANICALS) ×3 IMPLANT
TUBE CONNECTING 12'X1/4 (SUCTIONS)
TUBE CONNECTING 12X1/4 (SUCTIONS) IMPLANT
TUBING INSUF HEATED (TUBING) ×3 IMPLANT
YANKAUER SUCT BULB TIP NO VENT (SUCTIONS) ×2 IMPLANT

## 2016-10-26 NOTE — Discharge Instructions (Signed)
[facility]      [facility] °[phone] ° °ABDOMINAL SURGERY: POST OP INSTRUCTIONS ° °Always review your discharge instruction sheet given to you by the facility where your surgery was performed. ° °IF YOU HAVE DISABILITY OR FAMILY LEAVE FORMS, YOU MUST BRING THEM TO THE OFFICE FOR PROCESSING.  PLEASE DO NOT GIVE THEM TO YOUR DOCTOR. ° °1. A prescription for pain medication may be given to you upon discharge.  Take your pain medication as prescribed, if needed.  If narcotic pain medicine is not needed, then you may take acetaminophen (Tylenol) or ibuprofen (Advil) as needed. °2. Take your usually prescribed medications unless otherwise directed. °3. If you need a refill on your pain medication, please contact your pharmacy. They will contact our office to request authorization.  Prescriptions will not be filled after 5pm or on week-ends. °4. You should follow a light diet the first few days after arrival home, such as soup and crackers, pudding, etc.unless your doctor has advised otherwise. A high-fiber, low fat diet can be resumed as tolerated.   Be sure to include lots of fluids daily. Most patients will experience some swelling and bruising on the chest and neck area.  Ice packs will help.  Swelling and bruising can take several days to resolve °5. Most patients will experience some swelling and bruising in the area of the incision. Ice pack will help. Swelling and bruising can take several days to resolve..  °6. It is common to experience some constipation if taking pain medication after surgery.  Increasing fluid intake and taking a stool softener will usually help or prevent this problem from occurring.  A mild laxative (Milk of Magnesia or Miralax) should be taken according to package directions if there are no bowel movements after 48 hours. °7.  You may have steri-strips (small skin tapes) in place directly over the incision.  These strips should be left on the skin for 10-14 days.  If your  surgeon used skin glue on the incision, you may shower in 48 hours.  The glue will flake off over the next 2-3 weeks.  Any sutures or staples will be removed at the office during your follow-up visit. You may find that a light gauze bandage over your incision may keep your staples from being rubbed or pulled. You may shower and replace the bandage daily. °8. ACTIVITIES:  You may resume regular (light) daily activities beginning the next day--such as daily self-care, walking, climbing stairs--gradually increasing activities as tolerated.  You may have sexual intercourse when it is comfortable.  Refrain from any heavy lifting or straining until approved by your doctor. °a. You may drive when you no longer are taking prescription pain medication, you can comfortably wear a seatbelt, and you can safely maneuver your car and apply brakes °b. Return to Work: __________8 weeks if applicable_________________________ °9. You should see your doctor in the office for a follow-up appointment approximately two weeks after your surgery.  Make sure that you call for this appointment within a day or two after you arrive home to insure a convenient appointment time. °OTHER INSTRUCTIONS:  °_____________________________________________________________ °_____________________________________________________________ ° °WHEN TO CALL YOUR DOCTOR: °1. Fever over 101.0 °2. Inability to urinate °3. Nausea and/or vomiting °4. Extreme swelling or bruising °5. Continued bleeding from incision. °6. Increased pain, redness, or drainage from the incision. °7. Difficulty swallowing or breathing °8. Muscle cramping or spasms. °9. Numbness or tingling in hands or feet or around lips. ° °The clinic staff is   available to answer your questions during regular business hours.  Please don’t hesitate to call and ask to speak to one of the nurses if you have concerns. ° °For further questions, please visit [email or web address] ° ° ° °

## 2016-10-26 NOTE — Anesthesia Postprocedure Evaluation (Signed)
Anesthesia Post Note  Patient: Larry Clements  Procedure(s) Performed: Procedure(s) (LRB): LAPAROSCOPIC PARTIAL GASTRECTOMY (N/A)     Patient location during evaluation: PACU Anesthesia Type: General Level of consciousness: awake and alert Pain management: pain level controlled Vital Signs Assessment: post-procedure vital signs reviewed and stable Respiratory status: spontaneous breathing, nonlabored ventilation, respiratory function stable and patient connected to nasal cannula oxygen Cardiovascular status: blood pressure returned to baseline and stable Postop Assessment: no signs of nausea or vomiting Anesthetic complications: no    Last Vitals:  Vitals:   10/26/16 1222 10/26/16 1451  BP: (!) 163/95 (!) 170/98  Pulse: 62 74  Resp: 15 16  Temp: 36.9 C 36.6 C    Last Pain:  Vitals:   10/26/16 1451  TempSrc: Oral  PainSc:                  Maribel Hadley DAVID

## 2016-10-26 NOTE — Progress Notes (Signed)
Report given to steve shaw rn as caregiver 

## 2016-10-26 NOTE — Interval H&P Note (Signed)
History and Physical Interval Note:  10/26/2016 7:54 AM  [place]  has presented today for surgery, with the diagnosis of gastric mass  The various methods of treatment have been discussed with the patient and family. After consideration of risks, benefits and other options for treatment, the patient has consented to  Procedure(s): LAPAROSCOPIC PARTIAL GASTRECTOMY (N/A) as a surgical intervention .  The patient's history has been reviewed, patient examined, no change in status, stable for surgery.  I have reviewed the patient's chart and labs.  Questions were answered to the patient's satisfaction.     Larry Clements

## 2016-10-26 NOTE — Anesthesia Preprocedure Evaluation (Addendum)
Anesthesia Evaluation  Patient identified by MRN, date of birth, ID band Patient awake    Reviewed: Allergy & Precautions, NPO status , Patient's Chart, lab work & pertinent test results  Airway Mallampati: II  TM Distance: >3 FB Neck ROM: Full    Dental  (+) Teeth Intact, Dental Advisory Given   Pulmonary    Pulmonary exam normal        Cardiovascular hypertension, Pt. on medications Normal cardiovascular exam     Neuro/Psych    GI/Hepatic   Endo/Other    Renal/GU Renal InsufficiencyRenal disease     Musculoskeletal   Abdominal   Peds  Hematology   Anesthesia Other Findings   Reproductive/Obstetrics                            Anesthesia Physical Anesthesia Plan  ASA: II  Anesthesia Plan: General   Post-op Pain Management:    Induction: Intravenous  PONV Risk Score and Plan: 1 and Ondansetron, Dexamethasone and Propofol  Airway Management Planned: Oral ETT  Additional Equipment:   Intra-op Plan:   Post-operative Plan: Extubation in OR  Informed Consent: I have reviewed the patients History and Physical, chart, labs and discussed the procedure including the risks, benefits and alternatives for the proposed anesthesia with the patient or authorized representative who has indicated his/her understanding and acceptance.   Dental advisory given  Plan Discussed with: CRNA and Surgeon  Anesthesia Plan Comments:        Anesthesia Quick Evaluation

## 2016-10-26 NOTE — Op Note (Signed)
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   FINDINGS:  Rubbery mass in proximal stomach approximately 11 cm.  PROCEDURE:  Pt was identified in the holding area and taken to the operating room and placed supine on the operating room table.  General anesthesia was induced.  A foley catheter was placed.  The abdomen was prepped and draped in sterile fashion.  Timeout was performed according to the surgical safety checklist.  When all was correct, we continued.    Local anesthetic was administered in the upper midline.  A Hasson trocar was placed with pursestring sutures.  One 5 mm trocar was placed in the upper midline as well, and one in the left upper quadrant.  The short gastric arteries were taken down with the harmonic scalpel.  Care was taken to avoid the spleen. The mass was coming off the greater curve just posterior to the short gastric arteries.   The stomach was mobilized up the greater curve to the GE junction.      Several fires of the Echelon stapler were used to divide the greater curve including the mass.    There was no bleeding at the staple line.  The mass was attempted to be placed into the endocatch bag via the 12 mm port.  However the mass was too large to fit completely into the bag. The 12 mm port was enlarged in the midline to remove the mass.  An alexis wound protector was placed around the port to protect the abdominal wall from the tumor.  The tumor and bag were removed.     The tumor and the  gastric mucosa were examined.  The tumor was out completely.   The left upper quadrant was reexamined for hemostasis.  There was no evidence of bleeding.  A 4 quadrant inspection was performed and was negative for signs of succus or blood.  The midline was closed with #1 looped PDS suture.  The remaining ports were removed and pneumoperitoneum was allowed to evacuate.  The skin of all the incisions was closed with 4-0 Monocryl in subcuticular fashion.  The wounds were cleaned, dried, and dressed with dermabond. Needle, sponge, and instrument counts were correct.  The patient was awakened from anesthesia and taken to the PACU in stable condition.

## 2016-10-26 NOTE — Transfer of Care (Signed)
Immediate Anesthesia Transfer of Care Note  Patient: Larry Clements  Procedure(s) Performed: Procedure(s): LAPAROSCOPIC PARTIAL GASTRECTOMY (N/A)  Patient Location: PACU  Anesthesia Type:General  Level of Consciousness: awake, alert  and oriented  Airway & Oxygen Therapy: Patient Spontanous Breathing  Post-op Assessment: Report given to RN, Post -op Vital signs reviewed and stable and Patient moving all extremities X 4  Post vital signs: Reviewed and stable  Last Vitals:  Vitals:   10/26/16 0601 10/26/16 0606  BP: (!) 167/97   Pulse:    Resp:    Temp:  37.1 C    Last Pain: There were no vitals filed for this visit.    Patients Stated Pain Goal: 3 (82/51/89 8421)  Complications: No apparent anesthesia complications

## 2016-10-26 NOTE — Anesthesia Procedure Notes (Signed)
Procedure Name: Intubation Date/Time: 10/26/2016 8:18 AM Performed by: Garrison Columbus T Pre-anesthesia Checklist: Patient identified, Emergency Drugs available, Suction available and Patient being monitored Patient Re-evaluated:Patient Re-evaluated prior to inductionOxygen Delivery Method: Circle System Utilized Preoxygenation: Pre-oxygenation with 100% oxygen Intubation Type: IV induction Ventilation: Mask ventilation without difficulty and Oral airway inserted - appropriate to patient size Laryngoscope Size: Miller, 2 and 3 Grade View: Grade II Tube type: Oral Tube size: 7.5 mm Number of attempts: 2 Airway Equipment and Method: Stylet and Oral airway Placement Confirmation: ETT inserted through vocal cords under direct vision,  positive ETCO2 and breath sounds checked- equal and bilateral Secured at: 23 cm Tube secured with: Tape Dental Injury: Teeth and Oropharynx as per pre-operative assessment  Comments: Intubation x 1 with Sabra Heck 2 to obtain grade 4 view. Miller 2 blade too short to lift epiglottis. DL x 2 with Sabra Heck 3 to obtain grade 2 view. Recommend longer blade for intubation.

## 2016-10-27 ENCOUNTER — Encounter (HOSPITAL_COMMUNITY): Payer: Self-pay | Admitting: General Surgery

## 2016-10-27 LAB — BASIC METABOLIC PANEL
Anion gap: 6 (ref 5–15)
BUN: 17 mg/dL (ref 6–20)
CALCIUM: 8.9 mg/dL (ref 8.9–10.3)
CHLORIDE: 101 mmol/L (ref 101–111)
CO2: 27 mmol/L (ref 22–32)
CREATININE: 1.97 mg/dL — AB (ref 0.61–1.24)
GFR calc non Af Amer: 35 mL/min — ABNORMAL LOW (ref >60)
GFR, EST AFRICAN AMERICAN: 41 mL/min — AB (ref >60)
Glucose, Bld: 139 mg/dL — ABNORMAL HIGH (ref 65–99)
Potassium: 4.3 mmol/L (ref 3.5–5.1)
Sodium: 134 mmol/L — ABNORMAL LOW (ref 135–145)

## 2016-10-27 LAB — CBC
HEMATOCRIT: 39.2 % (ref 39.0–52.0)
Hemoglobin: 12.4 g/dL — ABNORMAL LOW (ref 13.0–17.0)
MCH: 27.3 pg (ref 26.0–34.0)
MCHC: 31.6 g/dL (ref 30.0–36.0)
MCV: 86.3 fL (ref 78.0–100.0)
Platelets: 213 10*3/uL (ref 150–400)
RBC: 4.54 MIL/uL (ref 4.22–5.81)
RDW: 13.9 % (ref 11.5–15.5)
WBC: 7.5 10*3/uL (ref 4.0–10.5)

## 2016-10-28 LAB — BASIC METABOLIC PANEL WITH GFR
Anion gap: 7 (ref 5–15)
BUN: 18 mg/dL (ref 6–20)
CO2: 26 mmol/L (ref 22–32)
Calcium: 8.7 mg/dL — ABNORMAL LOW (ref 8.9–10.3)
Chloride: 99 mmol/L — ABNORMAL LOW (ref 101–111)
Creatinine, Ser: 2.03 mg/dL — ABNORMAL HIGH (ref 0.61–1.24)
GFR calc Af Amer: 40 mL/min — ABNORMAL LOW (ref >60)
GFR calc non Af Amer: 34 mL/min — ABNORMAL LOW (ref >60)
Glucose, Bld: 141 mg/dL — ABNORMAL HIGH (ref 65–99)
Potassium: 4 mmol/L (ref 3.5–5.1)
Sodium: 132 mmol/L — ABNORMAL LOW (ref 135–145)

## 2016-10-28 LAB — CBC
HCT: 39.5 % (ref 39.0–52.0)
Hemoglobin: 12.4 g/dL — ABNORMAL LOW (ref 13.0–17.0)
MCH: 27.4 pg (ref 26.0–34.0)
MCHC: 31.4 g/dL (ref 30.0–36.0)
MCV: 87.2 fL (ref 78.0–100.0)
Platelets: 210 10*3/uL (ref 150–400)
RBC: 4.53 MIL/uL (ref 4.22–5.81)
RDW: 13.9 % (ref 11.5–15.5)
WBC: 6.6 10*3/uL (ref 4.0–10.5)

## 2016-10-28 MED ORDER — SENNOSIDES-DOCUSATE SODIUM 8.6-50 MG PO TABS
1.0000 | ORAL_TABLET | Freq: Two times a day (BID) | ORAL | Status: DC
  Administered 2016-10-28 – 2016-10-29 (×3): 1 via ORAL
  Filled 2016-10-28 (×3): qty 1

## 2016-10-28 MED ORDER — BISACODYL 10 MG RE SUPP
10.0000 mg | Freq: Once | RECTAL | Status: AC
  Administered 2016-10-28: 10 mg via RECTAL
  Filled 2016-10-28: qty 1

## 2016-10-28 MED ORDER — PANTOPRAZOLE SODIUM 40 MG PO TBEC
40.0000 mg | DELAYED_RELEASE_TABLET | Freq: Every day | ORAL | Status: DC
  Administered 2016-10-28: 40 mg via ORAL
  Filled 2016-10-28: qty 1

## 2016-10-28 NOTE — Progress Notes (Signed)
2 Days Post-Op   Subjective/Chief Complaint: Doing well.  No n/v.  Minimal pain.  Passing gas, but no BM yet.  Tolerating full liquid diet.    Objective: Vital signs in last 24 hours: Temp:  [98.2 F (36.8 C)-99.1 F (37.3 C)] 98.2 F (36.8 C) (06/16 0537) Pulse Rate:  [65-75] 65 (06/16 0537) Resp:  [18] 18 (06/16 0537) BP: (128-168)/(74-87) 154/87 (06/16 0537) SpO2:  [93 %-96 %] 96 % (06/16 0537) Last BM Date: 10/26/16  Intake/Output from previous day: 06/15 0701 - 06/16 0700 In: 1518.8 [P.O.:840; I.V.:678.8] Out: 1375 [Urine:1375] Intake/Output this shift: No intake/output data recorded.  General appearance: alert, cooperative and no distress Resp: breathing comfortably GI: soft, non tender, wound c/d/i.  sl bloated. Extremities: extremities normal, atraumatic, no cyanosis or edema  Lab Results:   Recent Labs  10/27/16 0703 10/28/16 0501  WBC 7.5 6.6  HGB 12.4* 12.4*  HCT 39.2 39.5  PLT 213 210   BMET  Recent Labs  10/27/16 0703 10/28/16 0501  NA 134* 132*  K 4.3 4.0  CL 101 99*  CO2 27 26  GLUCOSE 139* 141*  BUN 17 18  CREATININE 1.97* 2.03*  CALCIUM 8.9 8.7*   PT/INR No results for input(s): LABPROT, INR in the last 72 hours. ABG No results for input(s): PHART, HCO3 in the last 72 hours.  Invalid input(s): PCO2, PO2  Studies/Results: No results found.  Anti-infectives: Anti-infectives    Start     Dose/Rate Route Frequency Ordered Stop   10/26/16 1400  ceFAZolin (ANCEF) IVPB 2g/100 mL premix     2 g 200 mL/hr over 30 Minutes Intravenous Every 8 hours 10/26/16 1230 10/26/16 1502   10/26/16 0700  ceFAZolin (ANCEF) 3 g in dextrose 5 % 50 mL IVPB     3 g 130 mL/hr over 30 Minutes Intravenous To [facility] 10/25/16 0805 10/26/16 0835      Assessment/Plan: s/p Procedure(s): LAPAROSCOPIC PARTIAL GASTRECTOMY (N/A) add stool softeners.  Soft diet Suppository Home BP meds.     LOS: 2 days    [facility] 10/28/2016

## 2016-10-29 LAB — BASIC METABOLIC PANEL
Anion gap: 6 (ref 5–15)
BUN: 21 mg/dL — AB (ref 6–20)
CHLORIDE: 101 mmol/L (ref 101–111)
CO2: 27 mmol/L (ref 22–32)
Calcium: 8.7 mg/dL — ABNORMAL LOW (ref 8.9–10.3)
Creatinine, Ser: 1.87 mg/dL — ABNORMAL HIGH (ref 0.61–1.24)
GFR calc Af Amer: 44 mL/min — ABNORMAL LOW (ref >60)
GFR calc non Af Amer: 38 mL/min — ABNORMAL LOW (ref >60)
Glucose, Bld: 111 mg/dL — ABNORMAL HIGH (ref 65–99)
POTASSIUM: 4 mmol/L (ref 3.5–5.1)
Sodium: 134 mmol/L — ABNORMAL LOW (ref 135–145)

## 2016-10-29 LAB — CBC
HEMATOCRIT: 38.1 % — AB (ref 39.0–52.0)
HEMOGLOBIN: 12 g/dL — AB (ref 13.0–17.0)
MCH: 27.1 pg (ref 26.0–34.0)
MCHC: 31.5 g/dL (ref 30.0–36.0)
MCV: 86 fL (ref 78.0–100.0)
Platelets: 221 10*3/uL (ref 150–400)
RBC: 4.43 MIL/uL (ref 4.22–5.81)
RDW: 13.5 % (ref 11.5–15.5)
WBC: 5.9 10*3/uL (ref 4.0–10.5)

## 2016-10-29 MED ORDER — PANTOPRAZOLE SODIUM 40 MG PO TBEC: 40.0000 mg | DELAYED_RELEASE_TABLET | Freq: Every day | ORAL | 0 refills | Status: DC

## 2016-10-29 MED ORDER — OXYCODONE HCL 5 MG PO TABS: 5.0000 mg | ORAL_TABLET | ORAL | 0 refills | Status: DC | PRN

## 2016-10-29 NOTE — Progress Notes (Signed)
Discussed discharge summary with patient. Reviewed all medications with patient. Patient received prescription for pain med. Patient ready for discharge.

## 2016-10-29 NOTE — Discharge Summary (Signed)
Physician Discharge Summary  Patient ID: Larry Clements MRN: [id number] DOB/AGE: 1955/08/18 61 y.o.  Admit date: 10/26/2016 Discharge date: 10/29/2016  Admission Diagnoses: Patient Active Problem List   Diagnosis Date Noted  . 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Larry Clements) 10/26/2016  . Abdominal mass   . Gastrointestinal stromal tumor (GIST) ([place])   . Elevated serum creatinine 05/09/2016  . Hypertriglyceridemia 05/09/2016  . Hypokalemia 06/15/2013  . Hypertension 03/14/2012    Discharge Diagnoses:  Active Problems:   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[facility]) and same as above.  Discharged Condition: stable  Hospital Course: Pt was admitted to the floor following laparoscopic resection of malignant gastric mass off the greater curve just posterior to the short gastric arteries 10/26/2016.  Pt did well without nausea.  His diet was advanced from bariatric clears to full liquids, then to soft diet.  He had BM with suppository.  He was ambulatory independently.  He was able to void without foley.  He was able to tolerate transition to oral narcotics.  He is discharged to home in good condition.    Consults: None  Significant Diagnostic Studies: labs: HCT 38 and Cr 1.87 (stable near baseline 1.7-2) prior to d/c.    Treatments: surgery: see above.    Discharge Exam: Blood pressure (!) 160/107, pulse 70, temperature 98.7 F (37.1 C), temperature source Oral, resp. rate 16, height 6\' 4"  (1.93 m), weight 117.5 kg (259 lb), SpO2 96 %. General appearance: alert, cooperative and no distress Resp: breathing comfortably GI: soft, non-tender; bowel sounds normal; no masses,  no organomegaly Extremities: warm, well perfused, no edema.  Disposition: 01-Home or Self Care  Discharge Instructions    Call MD for:  difficulty breathing, headache or visual disturbances    Complete by:  As directed    Call MD for:  persistant nausea and vomiting     Complete by:  As directed    Call MD for:  redness, tenderness, or signs of infection (pain, swelling, redness, odor or green/yellow discharge around incision site)    Complete by:  As directed    Call MD for:  severe uncontrolled pain    Complete by:  As directed    Call MD for:  temperature >100.4    Complete by:  As directed    Discharge diet:    Complete by:  As directed    Soft diet for 1 week, then slowly increase regular texture foods over the following week until back to regular diet.   Driving Restrictions    Complete by:  As directed    1-2 weeks   Increase activity slowly    Complete by:  As directed    Lifting restrictions    Complete by:  As directed    Not over 10 pounds for 3 weeks, not over 20 pounds for 8 weeks.     Allergies as of 10/29/2016      Reactions   No Known Allergies       Medication List    TAKE these medications   amLODipine 10 MG tablet Commonly known as:  NORVASC Take 1 tablet (10 mg total) by mouth daily.   furosemide 40 MG tablet Commonly known as:  LASIX TAKE 1 TABLET (40 MG TOTAL) BY MOUTH DAILY.   KLOR-CON M20 20 MEQ tablet Generic drug:  potassium chloride SA TAKE 2 TABLETS EVERY DAY   labetalol 300 MG tablet Commonly known as:  NORMODYNE Take 300 mg by mouth every  8 (eight) hours.   losartan 100 MG tablet Commonly known as:  COZAAR TAKE 1 TABLET EVERY DAY   oxyCODONE 5 MG immediate release tablet Commonly known as:  Oxy IR/ROXICODONE Take 1-2 tablets (5-10 mg total) by mouth every 4 (four) hours as needed for moderate pain.   pantoprazole 40 MG tablet Commonly known as:  PROTONIX Take 1 tablet (40 mg total) by mouth at bedtime.      Follow-up Information    Stark Klein, MD Follow up in 2 week(s).   Specialty:  General Surgery Contact information: [address] [place] [address] [phone]           Signed: Stark Klein 10/29/2016, 9:19 AM

## 2016-10-31 ENCOUNTER — Telehealth: Payer: Self-pay | Admitting: Internal Medicine

## 2016-10-31 NOTE — Telephone Encounter (Signed)
Spoke with patient to see how he is feeling status post discharge on 6/17.  Patient states that he's feeling very well.  He is relaxing and reclining and taking it easy.  Advised that we just wanted to follow up with him and see how he was doing.  And, we wanted to make sure that he follows up with Dr. Barry Dienes in 2 weeks per his discharge summary.  Patient states that he didn't know anything about that.  Advised that I would provide him with their phone # to call and make an appointment.  Patient stated that he didn't have a pen or pencil handy while in his recliner.    I told him that I would call and make sure that he has an appointment made and send him information.  I called and spoke with scheduler, [facility].  Patient has appointment scheduled for 11/20/16 @ 2:45.  He should arrive at 2:30 for 2 week follow up post surgery.  I am mailing him a copy of this information to his home address.    I have faxed his discharge summary to Dr. Krystal Eaton at [facility] so she is kept aware of his situation as well.

## 2016-11-28 ENCOUNTER — Telehealth: Payer: Self-pay | Admitting: Nurse Practitioner

## 2016-11-28 NOTE — Telephone Encounter (Signed)
Appt has been scheduled for the pt to see Ned Card on 7/20 at 215pm. Pt aware to arrive 30 minutes early. Also aware that he will meet with Dr. Burr Medico at his appt. Pt agreed to the appt date and time.

## 2016-12-01 ENCOUNTER — Ambulatory Visit (HOSPITAL_BASED_OUTPATIENT_CLINIC_OR_DEPARTMENT_OTHER): Payer: BLUE CROSS/BLUE SHIELD | Admitting: Nurse Practitioner

## 2016-12-01 ENCOUNTER — Ambulatory Visit: Payer: BLUE CROSS/BLUE SHIELD | Admitting: Nurse Practitioner

## 2016-12-01 ENCOUNTER — Telehealth: Payer: Self-pay | Admitting: Hematology

## 2016-12-01 VITALS — BP 168/90 | HR 75 | Temp 98.7°F | Resp 18 | Ht 76.0 in | Wt 261.2 lb

## 2016-12-01 DIAGNOSIS — I10 Essential (primary) hypertension: Secondary | ICD-10-CM

## 2016-12-01 DIAGNOSIS — C49A2 Gastrointestinal stromal tumor of stomach: Secondary | ICD-10-CM | POA: Diagnosis not present

## 2016-12-01 DIAGNOSIS — N183 Chronic kidney disease, stage 3 (moderate): Secondary | ICD-10-CM | POA: Diagnosis not present

## 2016-12-01 NOTE — Progress Notes (Addendum)
[facility]  [phone] Fax:([phone]  Clinic New Consult Note   Patient Care Team: Elby Showers, MD as PCP - General (Internal Medicine) 12/01/2016  CHIEF COMPLAINTS/PURPOSE OF CONSULTATION:  Gastrointestinal stromal tumor of the stomach  HISTORY OF PRESENTING ILLNESS:  Larry Clements 61 y.o. male recently diagnosed with a gastrointestinal stromal tumor of the stomach. Renal ultrasound on 07/31/2016 showed an incidental finding of an approximate 9 cm mass within the left upper abdominal quadrant. Follow-up MRI of the abdomen 08/17/2016 showed a 10.3 cm mass in the left upper quadrant adjacent to the gastric fundus interposed between the stomach and the spleen and tracking along the upper portion of the pancreatic tail. Endoscopic ultrasound by Dr. Ardis Hughs 09/21/2016 showed a large hypoechoic heterogeneous mass directly abutting the gastric wall. The mass appeared to involve the muscularis propria layer of the gastric wall and measured at least 8.5 cm. Fine-needle aspiration of the mass showed gastrointestinal stromal tumor. On 10/26/2016 he underwent a laparoscopic partial gastrectomy by Dr. Barry Dienes. The mass was coming off the greater curve just posterior to the short gastric arteries. Final pathology showed a 10.5 cm gastrointestinal stromal tumor, spindle cell type; margins uninvolved by tumor. Mitotic rate 13/50 high-power field; grade 2.   MEDICAL HISTORY:  Past Medical History:  Diagnosis Date  . Allergy   . Chronic kidney disease    mass  . Chronic kidney disease, stage III (moderate)   . Gastrointestinal stromal tumor (GIST) ([place])   . HSV-2 (herpes simplex virus 2) infection   . Hypertension     SURGICAL HISTORY: Past Surgical History:  Procedure Laterality Date  . COLONOSCOPY    . EUS N/A 09/21/2016   Procedure: UPPER ENDOSCOPIC ULTRASOUND (EUS) LINEAR;  Surgeon: Milus Banister, MD;  Location: [facility];  Service: Endoscopy;   Laterality: N/A;  . LAPAROSCOPIC GASTRECTOMY N/A 10/26/2016   Procedure: LAPAROSCOPIC PARTIAL GASTRECTOMY;  Surgeon: Stark Klein, MD;  Location: [facility];  Service: General;  Laterality: N/A;  . LAPAROSCOPIC PARTIAL GASTRECTOMY  10/26/2016  . SURGICAL EXCISION OF EXCESSIVE SKIN     fatty tissue    SOCIAL HISTORY: He lives in [place]. He is divorced. He has 1 son and 1 daughter. Both live locally. He is employed by [employer]. No tobacco or alcohol use.  FAMILY HISTORY: Family History  Problem Relation Age of Onset  . Diabetes Father   . Kidney failure Father   . Colon cancer Father   . Hypertension Mother   He reports his mother had Alzheimer's. Father may have had prostate cancer.   ALLERGIES:  No known allergies.  MEDICATIONS:  Current Outpatient Prescriptions  Medication Sig Dispense Refill  . amLODipine (NORVASC) 10 MG tablet Take 1 tablet (10 mg total) by mouth daily. 90 tablet 0  . furosemide (LASIX) 40 MG tablet TAKE 1 TABLET (40 MG TOTAL) BY MOUTH DAILY. 90 tablet 0  . KLOR-CON M20 20 MEQ tablet TAKE 2 TABLETS EVERY DAY 180 tablet 0  . labetalol (NORMODYNE) 300 MG tablet Take 300 mg by mouth every 8 (eight) hours.  5  . losartan (COZAAR) 100 MG tablet TAKE 1 TABLET EVERY DAY 90 tablet 1  . oxyCODONE (OXY IR/ROXICODONE) 5 MG immediate release tablet Take 1-2 tablets (5-10 mg total) by mouth every 4 (four) hours as needed for moderate pain. 30 tablet 0  . pantoprazole (PROTONIX) 40 MG tablet Take 1 tablet (40 mg total) by mouth at bedtime. 30 tablet 0   No current  facility-administered medications for this visit.     REVIEW OF SYSTEMS:   Constitutional: Denies fevers, chills or abnormal night sweats Eyes: Denies blurriness of vision, double vision or watery eyes Ears, nose, mouth, throat, and face: Denies mucositis or sore throat Respiratory: Denies cough, dyspnea or wheezes Cardiovascular: Denies palpitation, chest discomfort or lower extremity swelling Gastrointestinal:   Denies nausea, heartburn or change in bowel habits Skin: Denies abnormal skin rashes Lymphatics: Denies new lymphadenopathy or easy bruising Neurological:Denies numbness, tingling or new weaknesses Behavioral/Psych: Mood is stable, no new changes  All other systems were reviewed with the patient and are negative.  PHYSICAL EXAMINATION: ECOG PERFORMANCE STATUS: 0  Vitals:   12/01/16 1404  BP: (!) 168/90  Pulse: 75  Resp: 18  Temp: 98.7 F (37.1 C)   Filed Weights   12/01/16 1404  Weight: 261 lb 3.2 oz (118.5 kg)    GENERAL:alert, no distress and comfortable SKIN: skin color, texture, turgor are normal, no rashes or significant lesions EYES: normal, conjunctiva are pink and non-injected, sclera clear OROPHARYNX:no exudate, no erythema and lips, buccal mucosa, and tongue normal  NECK: supple, thyroid normal size, non-tender, without nodularity LYMPH:  no palpable lymphadenopathy in the cervical, axillary or inguinal regions LUNGS: clear to auscultation and percussion with normal breathing effort HEART: regular rate & rhythm and no murmurs and no lower extremity edema ABDOMEN:abdomen soft, non-tender and normal bowel sounds. Healed surgical midline incision. Musculoskeletal:no cyanosis of digits and no clubbing  PSYCH: alert & oriented x 3 with fluent speech NEURO: no focal motor/sensory deficits  LABORATORY DATA:  I have reviewed the data as listed CBC Latest Ref Rng & Units 10/29/2016 10/28/2016 10/27/2016  WBC 4.0 - 10.5 K/uL 5.9 6.6 7.5  Hemoglobin 13.0 - 17.0 g/dL 12.0(L) 12.4(L) 12.4(L)  Hematocrit 39.0 - 52.0 % 38.1(L) 39.5 39.2  Platelets 150 - 400 K/uL 221 210 213   PATHOLOGY  Diagnosis Stomach, resection for tumor, Greater curve - GASTROINTESTINAL STROMAL TUMOR, SPINDLE CELL TYPE, 10.5 CM - MARGINS UNINVOLVED BY TUMOR - SEE ONCOLOGY TABLE AND COMMENT BELOW Microscopic Comment GASTROINTESTINAL STROMAL TUMOR (GIST): Procedure: Resection, partial gastrectomy Tumor  Site: Not specified Tumor Size: 10.5 cm Tumor Focality: Unifocal Histologic Type: Gastrointestinal stromal tumor, spindle cell type Mitotic Rate: 13 /50 HIGH POWER FIELD Necrosis: Not identified Histologic Grade: G2 (High grade; mitotic rate >5/50 HIGH POWER FIELD) Risk Assessment: High Margins: Uninvolved by GIST (See comment) Ancillary testing: See [id number] Immunohistochemistry: CD117: positive Other: CD34 and vimentin positive Lymph nodes: No lymph nodes submitted or found Pathologic Staging: pT4 pN0 Comment: The edge of the GIST is grossly present in the stapled surgical margin; however, there is no evidence of GIST in the histologic sections of the margin. Therefore, the actual distance of the lesion from the stapled margin is not known.    ASSESSMENT & PLAN:  1. Gastrointestinal stromal tumor of the stomach status post partial gastrectomy 10/26/2016; 10.5 cm tumor, mitotic rate 13/50 high-power field, grade 2. 2. Chronic kidney disease, stage III 3. Hypertension  Larry Clements is a 61 year old man recently diagnosed with a gastrointestinal stromal tumor of the stomach. He underwent a partial gastrectomy 10/26/2016. Based on the tumor size and mitotic rate Dr. Burr Medico recommends adjuvant Gleevec 400 mg daily for 3 years. We reviewed potential toxicities including hematologic toxicity, edema, fatigue, nausea, diarrhea, rash. He is undecided regarding proceeding with adjuvant Gleevec. He understands he is at high risk for recurrence. We are requesting c-kit mutation analysis. We will contact him  once this result is available. He would like to have this information prior to making his decision.  We scheduled a 3 month office visit and will adjust accordingly pending his decision regarding adjuvant Gleevec.  Patient seen with Dr. Burr Medico.     Ned Card, NP 12/01/2016 2:06 PM   ADDENDUM   I have seen the patient, examined him. I agree with the assessment and and plan and have  edited the notes.   I have reviewed his surgical pathology findings, and are reviewed with patient and his daughter. Per NCCN counseling, giving the size of the tumor, and high mitotic rate, he is at very high risk for recurrence ((86%), and standard care is adjuvant imatinib 400 mg daily for 3 years, no need dose adjustments based on his current renal function. The benefit and potential side effects were discussed with patient in details. However he is very reluctant to take imatinib, due to the incidental finding of the tumor. I'll request kit-mutation test on his tumor, and call him with the results return. He will make a decision after that. If he decides not to pursue adjuvant imatinib, he will be followed closely for monitoring.  Truitt Merle  12/01/2016

## 2016-12-01 NOTE — Patient Instructions (Signed)
Imatinib tablets  What is this medicine?  IMATINIB (i MAT in ib) is a medicine that targets proteins in cancer cells and stops the cancer cells from growing. It is used to treat certain leukemias, myelodysplastic syndromes, and other cancers. It is also used to treat specific digestive tract tumors called GISTs.  This medicine may be used for other purposes; ask your health care provider or pharmacist if you have questions.  COMMON BRAND NAME(S): Gleevec  What should I tell my health care provider before I take this medicine?  They need to know if you have any of these conditions:  -bleeding problems  -infection (especially a virus infection such as chickenpox, cold sores, or herpes)  -heart disease  -heart failure  -kidney disease  -liver disease  -lung disease  -stomach problems  -an unusual or allergic reaction to imatinib, other medicines, foods, dyes, or preservatives  -pregnant or trying to get pregnant  -breast-feeding  How should I use this medicine?  Take this medicine by mouth with a full glass of water. Take it with food to decrease the chance of it upsetting your stomach. Do not take with grapefruit juice. Follow the directions on the prescription label. Take your medicine at regular intervals. Do not take it more often than directed. Do not stop taking except on your doctor's advice.  If you have difficulty swallowing the tablets, let your doctor, pharmacist, or health care professional know. They can help you with advice.  Talk to your pediatrician regarding the use of this medicine in children. While this drug may be prescribed for children as young as 1 year for selected conditions, precautions do apply.  Overdosage: If you think you have taken too much of this medicine contact a poison control center or emergency room at once.  NOTE: This medicine is only for you. Do not share this medicine with others.  What if I miss a dose?  If you miss a dose, take it as soon as you can. If it is almost time for  your next dose, take only that dose and skip your missed dose. Do not take double or extra doses.  What may interact with this medicine?  -antiviral medicines for HIV or AIDS  -bosentan  -cisapride  -clarithromycin  -cyclosporine  -dexamethasone  -diltiazem  -ergot alkaloids like dihydroergotamine, ergonovine, ergotamine, methylergonovine  -erythromycin  -grapefruit or grapefruit juice  -medicines for cholesterol like atorvastatin lovastatin, simvastatin  -medicines for depression, anxiety, or psychotic disturbances  -medicines for fungal infections like ketoconazole and itraconazole  -medicines for irregular heart beat like amiodarone, bepridil, dofetilide, encainide, flecainide, propafenone, quinidine  -medicines for seizures like carbamazepine, phenobarbital, phenytoin  -medicines for sleep  -NSAIDS, medicines for pain and inflammation, like ibuprofen or naproxen  -pimozide  -rifabutin  -rifampin  -sildenafil  -sirolimus  -St. John's wort  -tacrolimus  -vaccines  -verapamil  -warfarin  Talk to your doctor or health care professional before taking any of these medicines:  -acetaminophen  -aspirin  -ibuprofen  -ketoprofen  -naproxen  This list may not describe all possible interactions. Give your health care provider a list of all the medicines, herbs, non-prescription drugs, or dietary supplements you use. Also tell them if you smoke, drink alcohol, or use illegal drugs. Some items may interact with your medicine.  What should I watch for while using this medicine?  Visit your doctor for checks on your progress. You will need to have regular blood tests while on this medicine. Report any new symptoms   doctor or health care professional if you notice any unusual bleeding. You may get drowsy or dizzy. Do not drive, use machinery, or do anything that needs mental alertness until you know how this medicine affects you. Do not become pregnant while taking this medicine or for 14 days after stopping it. Women should inform their doctor if they wish to become pregnant or think they might be pregnant. There is a potential for serious side effects to an unborn child. Talk to your health care professional or pharmacist for more information. Do not breast-feed an infant while taking this medicine or for 1 month after stopping it. What side effects may I notice from receiving this medicine? Side effects that you should report to your doctor or health care professional as soon as possible: -low blood counts - this medicine may decrease the number of white blood cells, red blood cells and platelets. You may be at increased risk for infections and bleeding. -signs of infection - fever or chills, cough, sore throat, pain or difficulty passing urine -signs of decreased platelets or bleeding - bruising, pinpoint red spots on the skin, black, tarry stools, blood in the urine, nosebleeds -signs of decreased red blood cells - unusually weak or tired, fainting spells, lightheadedness -allergic reactions like skin rash, itching or hives, swelling of the face, lips, or tongue -breathing problems -changes in vision -dark urine -general ill feeling or flu-like symptoms -light-colored stools -loss of appetite -mouth sores -redness, blistering, peeling or loosening of the skin, including inside the mouth -right upper belly pain -swelling of the legs or ankles -trouble passing urine or change in the amount of urine -vomiting -yellowing of the eyes or skin Side effects that usually do not require medical attention (report to your doctor or health care professional if they continue or are  bothersome): -decreased appetite -diarrhea -difficulty sleeping -headache -heartburn -joint pain -muscle cramps or pain -nausea -upset stomach This list may not describe all possible side effects. Call your doctor for medical advice about side effects. You may report side effects to FDA at 1-800-FDA-1088. Where should I keep my medicine? Keep out of reach of children. Store tablets at room temperature between 15 and 30 degrees C (59 and 86 degrees F). Protect from moisture. Keep tightly closed. Throw away any unused medicine after the expiration date. NOTE: This sheet is a summary. It may not cover all possible information. If you have questions about this medicine, talk to your doctor, pharmacist, or health care provider.  2018 Elsevier/Gold Standard (2015-01-12 15:36:35)

## 2016-12-01 NOTE — Telephone Encounter (Signed)
Scheduled appt per 7/20 los - Gave patient AVS and calender per los.  

## 2016-12-04 ENCOUNTER — Other Ambulatory Visit (HOSPITAL_COMMUNITY)
Admission: RE | Admit: 2016-12-04 | Discharge: 2016-12-04 | Disposition: A | Discharge status: Home / Self Care | Payer: BLUE CROSS/BLUE SHIELD | Source: Ambulatory Visit | Attending: Hematology | Admitting: Hematology

## 2016-12-04 DIAGNOSIS — C169 Malignant neoplasm of stomach, unspecified: Secondary | ICD-10-CM | POA: Diagnosis present

## 2016-12-05 ENCOUNTER — Encounter: Payer: Self-pay | Admitting: Nurse Practitioner

## 2016-12-13 ENCOUNTER — Encounter (HOSPITAL_COMMUNITY): Payer: Self-pay

## 2016-12-14 ENCOUNTER — Telehealth: Payer: Self-pay | Admitting: Internal Medicine

## 2016-12-14 NOTE — Telephone Encounter (Signed)
Spoke with pt. Asked him to consider [place]

## 2016-12-14 NOTE — Telephone Encounter (Signed)
Patient is calling and wants to know if you will call him at your convenience.  He is wanting your opinion on the "pill" that they are asking him to take for the tumor.  I asked him the name of the pill, but he doesn't know the name.    His number is:  [phone].  Thank you.

## 2016-12-20 ENCOUNTER — Telehealth: Payer: Self-pay | Admitting: Hematology

## 2016-12-20 ENCOUNTER — Other Ambulatory Visit: Payer: Self-pay | Admitting: Hematology

## 2016-12-20 DIAGNOSIS — C49A2 Gastrointestinal stromal tumor of stomach: Secondary | ICD-10-CM

## 2016-12-20 MED ORDER — IMATINIB MESYLATE 400 MG PO TABS: 400.0000 mg | ORAL_TABLET | Freq: Every day | ORAL | 3 refills | Status: DC

## 2016-12-20 NOTE — Telephone Encounter (Signed)
Thanks.   Dr Lorrene Reid, Nephrologist, agrees also.

## 2016-12-20 NOTE — Telephone Encounter (Signed)
Dr. Renold Genta, thanks for encouraging the patient, I appreciate it.   Truitt Merle MD

## 2016-12-20 NOTE — Telephone Encounter (Signed)
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 I strongly recommend him to start adjuvant imatinib ASAP, to reduce his high risk of GIST recurrence. The potential benefit and side effects reviewed with the patient again. He finally agreed after he spoke with his primary care physician Dr. Renold Genta. I'll send a prescription of imatinib out today, he will give [place] a call when he receives the medication, and start imatinib 400 mg daily as well as he receives it. I will or pharmacist will also follow up on this. I plan to see him back in October as is scheduled. He knows to call us if he experiences any side effects.   Larry Clements  12/20/2016

## 2016-12-20 NOTE — Progress Notes (Unsigned)
gle

## 2016-12-22 ENCOUNTER — Telehealth: Payer: Self-pay | Admitting: Pharmacist

## 2016-12-22 ENCOUNTER — Telehealth: Payer: Self-pay | Admitting: Hematology

## 2016-12-22 DIAGNOSIS — C49A2 Gastrointestinal stromal tumor of stomach: Secondary | ICD-10-CM

## 2016-12-22 MED ORDER — IMATINIB MESYLATE 400 MG PO TABS: 400.0000 mg | ORAL_TABLET | Freq: Every day | ORAL | 3 refills | Status: DC

## 2016-12-22 NOTE — Telephone Encounter (Signed)
Oral Oncology Pharmacist Encounter  Received new prescription for [place] for the treatment of gastrointestinal stromal tumor, planned duration until disease progression or unacceptable toxicity.  Labs in [place] assessed, [place] for treatment, noted longstanding elevated SCr, Crcl ~70 ml/min, no dose adjustments recommended at this time.  Patient will need lab check ~2 weeks after treatment start  Current medication list in Epic reviewed, DDIs with Gleevec identified:  Gleevec and Norvasc: Category C: Gleevec will inhibit CYP3A4 thereby increasing systemic exposure to Norvasc. BPs in Epic reviewed, all in hypertensive range, no change to current therapy indicated at this time.  Prescription has been e-scribed to the [facility] for benefits analysis and approval. PA approved, copayment $5  I LVM for patient with offer for update on new medication.  Oral oncology Clinic will continue to follow for initial counseling and start date.  Thank you,  [name], PharmD, BCPS, BCOP 12/22/2016  11:51 AM Oral Oncology Clinic [phone]

## 2016-12-22 NOTE — Telephone Encounter (Signed)
Oral Oncology Patient Advocate Encounter  Prior Authorization for Larry Clements has been approved.     Effective dates: 12/20/16  through 12/21/2017         Test claim at the pharmacy revealed a $5.00 monthly copay.   Oral Oncology Clinic will reach out to clinic today.   [place] Patient Advocate 12/22/2016 10:14 AM    12/22/2016 10:14 AM

## 2016-12-25 ENCOUNTER — Telehealth: Payer: Self-pay | Admitting: Hematology

## 2016-12-25 MED FILL — IMATINIB MESYLATE 400 MG TA: 400 | 30 days supply | Qty: 30 | Fill #0

## 2016-12-25 NOTE — Telephone Encounter (Signed)
THANKS. I will move this f/u to the first week of Sep then.   Truitt Merle MD

## 2016-12-25 NOTE — Telephone Encounter (Signed)
R/s appt per sch message 8/13 - left message and sent reminder letter in the mail.

## 2016-12-25 NOTE — Telephone Encounter (Signed)
Oral Chemotherapy Pharmacist Encounter   I spoke with patient for overview of new oral chemotherapy medication: Gleevec for the treatment of gastrointestinal stromal tumor, planned duration until disease progression or unacceptable toxicity.   Pt is doing well. The prescriptions have been sent to the [facility] for benefit analysis and approval.  Copayment $5, patient will coordinate shipment of [place] to his house with the pharmacy. Planned ship date 12/26/16.  Patient will need MD follow-up and lab check ~2 weeks after starting therapy Anticipated Gleevec start date: 12/27/16  Counseled patient on administration, dosing, side effects, safe handling, and monitoring. Patient will take Gleevec 400mg  tablet, 1 tablet by mouth once daily, with food and a full glass of water, at approximately the same time each day. Patient knows to avoid grapefruit and grapefruit juice while on therapy with Gleevec.  Side effects include but not limited to: fatigue, GI upset, muscle or joint pain, rash, and edema.    Reviewed with patient importance of keeping a medication schedule and plan for any missed doses.  Mr. Larry Clements voiced understanding and appreciation.   All questions answered. Patient transferred to the [place] to arrange for [facility] shipment. Patient knows he will need to be seen in the office before currently schedule Oct MD visit for check-in of new medication.  Patient knows to call the office with questions or concerns. Oral Oncology Clinic will continue to follow.  Thank you,  [name], PharmD, BCPS, BCOP 12/25/2016  12:59 PM Oral Oncology Clinic [phone]

## 2017-01-17 MED FILL — IMATINIB MESYLATE 400 MG TA: 400 | 30 days supply | Qty: 30 | Fill #1

## 2017-01-17 NOTE — Progress Notes (Signed)
Larry Clements  [phone] [phone]  Clinic Followup Note   Patient Care Team: Elby Showers, MD as PCP - General (Internal Medicine) 01/20/2017  CHIEF COMPLAINTS/PURPOSE OF CONSULTATION:  Gastrointestinal stromal tumor of the stomach  Oncology History   Cancer Staging 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[place]) Staging form: Gastrointestinal Stromal Tumor - Gastric and Omental GIST, AJCC 8th Edition - Pathologic stage from 10/26/2016: Stage IIIB (pT4, pN0, cM0, Mitotic Rate: High) - Signed by Truitt Merle, MD on 01/20/2017       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[place])   08/18/2016 Imaging    Abdominal MRI with and without contrast showed a 10.3 cm mass in the left upper quadrant at adjacent to the gastric fundus      10/26/2016 Initial Diagnosis    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[place])     10/26/2016 Surgery    laparoscopic partial gastrectomy by Dr. 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 male recently diagnosed with a gastrointestinal stromal tumor of the stomach. Renal ultrasound on 07/31/2016 showed an incidental finding of an approximate 9 cm mass within the left upper abdominal quadrant. Follow-up MRI of the abdomen 08/17/2016 showed a 10.3 cm mass in the left upper quadrant adjacent to the gastric fundus interposed between the stomach and the spleen and tracking along the upper portion of the pancreatic tail. Endoscopic ultrasound by Dr. Ardis Hughs 09/21/2016 showed a large hypoechoic heterogeneous mass  directly abutting the gastric wall. The mass appeared to involve the muscularis propria layer of the gastric wall and measured at least 8.5 cm. Fine-needle aspiration of the mass showed gastrointestinal stromal tumor. On 10/26/2016 he underwent a laparoscopic partial gastrectomy by Dr. Barry Dienes. The mass was coming off the greater curve just posterior to the short gastric arteries. Final pathology showed a 10.5 cm gastrointestinal stromal tumor, spindle cell type; margins uninvolved by tumor. Mitotic rate 13/50 high-power field; grade 2.  CURRENT THERAPY: Adjuvant imatinib 400 mg daily for 3 years, started on 12/27/2016  INTERVAL HISTORY:   Larry Clements presents to the office today and reports that he is doing well overall. He states that he has been taking Imatinib for the past 4 weeks with intermittent chest cramping. 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   On review of systems, pt denies nausea, vomiting, diarrhea, abdominal pain, abdominal bloating. Denies decreased appetite change or weight loss. Denies extremity swelling.     MEDICAL HISTORY:  Past Medical History:  Diagnosis Date  . Allergy   . Chronic kidney disease    mass  . Chronic kidney disease, stage III (moderate)   . Gastrointestinal stromal tumor (GIST) ([place])   . HSV-2 (herpes simplex virus 2) infection   . Hypertension     SURGICAL HISTORY: Past Surgical History:  Procedure Laterality Date  . COLONOSCOPY    . EUS N/A 09/21/2016   Procedure: UPPER ENDOSCOPIC ULTRASOUND (EUS) LINEAR;  Surgeon: Milus Banister, MD;  Location: [facility] END[facility];  Service: Endoscopy;  Laterality: N/A;  . LAPAROSCOPIC GASTRECTOMY N/A 10/26/2016   Procedure: LAPAROSCOPIC PARTIAL GASTRECTOMY;  Surgeon: Stark Klein, MD;  Location: [facility];  Service: General;  Laterality: N/A;  . LAPAROSCOPIC PARTIAL GASTRECTOMY  10/26/2016  . SURGICAL EXCISION OF EXCESSIVE SKIN     fatty tissue    SOCIAL HISTORY: He lives in  [place]. He is divorced. He has 1 son and 1 daughter. Both live locally. He is employed by [employer]. No tobacco or alcohol use.  FAMILY HISTORY: Family History  Problem Relation Age of Onset  . Diabetes Father   . Kidney failure Father   . Colon cancer Father   . Hypertension Mother   He reports his mother had Alzheimer's. Father may have had prostate cancer.   ALLERGIES:  No known allergies.  MEDICATIONS:  Current Outpatient Prescriptions  Medication Sig Dispense Refill  . amLODipine (NORVASC) 10 MG tablet Take 1 tablet (10 mg total) by mouth daily. 90 tablet 0  . furosemide (LASIX) 40 MG tablet TAKE 1 TABLET (40 MG TOTAL) BY MOUTH DAILY. 90 tablet 0  . imatinib (GLEEVEC) 400 MG tablet Take 1 tablet (400 mg total) by mouth daily. Take with meals and large glass of water.Caution:Chemotherapy. 30 tablet 3  . KLOR-CON M20 20 MEQ tablet TAKE 2 TABLETS EVERY DAY 180 tablet 0  . labetalol (NORMODYNE) 300 MG tablet Take 300 mg by mouth every 8 (eight) hours.  5  . losartan (COZAAR) 100 MG tablet TAKE 1 TABLET EVERY DAY 90 tablet 1   No current facility-administered medications for this visit.     REVIEW OF SYSTEMS:   Constitutional: Denies fevers, chills or abnormal night sweats Eyes: Denies blurriness of vision, double vision or watery eyes Ears, nose, mouth, throat, and face: Denies mucositis or sore throat Respiratory: Denies cough, dyspnea or wheezes Cardiovascular: Denies palpitation, chest discomfort or lower extremity swelling Gastrointestinal:  Denies nausea, heartburn or change in bowel habits Skin: Denies abnormal skin rashes Lymphatics: Denies new lymphadenopathy or easy bruising Neurological:Denies numbness, tingling or new weaknesses Behavioral/Psych: Mood is stable, no new changes  All other systems were reviewed with the patient and are negative.  PHYSICAL EXAMINATION: ECOG PERFORMANCE STATUS: 0  Vitals:   01/18/17 1545  BP: (!) 159/99  Pulse: 74  Resp: 18  Temp:  98.9 F (37.2 C)  SpO2: 99%   Filed Weights   01/18/17 1545  Weight: 267 lb 9.6 oz (121.4 kg)    GENERAL:alert, no distress and comfortable SKIN: skin color, texture, turgor are normal, no rashes or significant lesions EYES: normal, conjunctiva are pink and non-injected, sclera clear OROPHARYNX:no exudate, no erythema and lips, buccal mucosa, and tongue normal  NECK: supple, thyroid normal size, non-tender, without nodularity LYMPH:  no palpable lymphadenopathy in the cervical, axillary or inguinal regions LUNGS: clear to auscultation and percussion with normal breathing effort HEART: regular rate & rhythm and no murmurs and no lower extremity edema ABDOMEN: Abdomen soft, non-tender and normal bowel sounds. Healed surgical midline incision. Musculoskeletal:no cyanosis of digits and no clubbing  PSYCH: alert & oriented x 3 with fluent speech NEURO: no focal motor/sensory deficits  LABORATORY DATA:  I have reviewed the data as listed CBC Latest Ref Rng & Units 01/18/2017 10/29/2016 10/28/2016  WBC 4.0 - 10.3 10e3/uL 4.7 5.9 6.6  Hemoglobin 13.0 - 17.1 g/dL 12.8(L) 12.0(L) 12.4(L)  Hematocrit 38.4 - 49.9 % 39.2 38.1(L) 39.5  Platelets 140 - 400 10e3/uL 217 221 210  PATHOLOGY   Laparoscopic partial gastrectomy 10/26/2016  Diagnosis Stomach, resection for tumor, Greater curve - GASTROINTESTINAL STROMAL TUMOR, SPINDLE CELL TYPE, 10.5 CM - MARGINS UNINVOLVED BY TUMOR - SEE ONCOLOGY TABLE AND COMMENT BELOW Microscopic Comment GASTROINTESTINAL STROMAL TUMOR (GIST): Procedure: Resection, partial gastrectomy Tumor Site: Not specified Tumor Size: 10.5 cm Tumor Focality: Unifocal Histologic Type: Gastrointestinal stromal tumor, spindle cell type Mitotic Rate: 13 /50 HIGH POWER FIELD Necrosis: Not identified Histologic Grade: G2 (High grade; mitotic rate >5/50 HIGH POWER FIELD) Risk Assessment: High Margins: Uninvolved by GIST (See comment) Ancillary testing: See  [id number] Immunohistochemistry: CD117: positive Other: CD34 and vimentin positive Lymph nodes: No lymph nodes submitted or found Pathologic Staging: pT4 pN0 Comment: The edge of the GIST is grossly present in the stapled surgical margin; however, there is no evidence of GIST in the histologic sections of the margin. Therefore, the actual distance of the lesion from the stapled margin is not known.    RADIOLOGY:   MR Abdomen W WO Contrast, 08/18/2016 IMPRESSION: 1. A 10.3 cm mass in the left upper quadrant adjacent to the gastric fundus interposed between the stomach and spleen, and tracking along the upper margin of the pancreatic tail has low T1 and high T2 signal characteristics with diffuse enhancement. Based on the slight heterogeneity I favor a GI stromal tumor. Differential diagnostic considerations may include lymphoma, gastrointestinal carcinoid or schwannoma, or possibly a or air gastrointestinal leiomyosarcoma. I am skeptical of an adrenal origin, the mass abuts but does not appear to be arising from the adrenal gland lateral limb. Surgical referral recommended. 2. Bosniak category 72F lesion of the right kidney lateral mid to lower pole. This cystic lesion is probably benign but warrants surveillance. Renal protocol MRI with and without contrast recommended in 6 months time to reassess. This recommendation follows ACR consensus guidelines: Management of the Incidental Renal Mass on CT: A White Paper of the ACR Incidental Findings Committee. J Am Coll Radiol 9093;11:216-244. 3. Small umbilical hernia contains adipose tissue. 4. 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  US Renal and US Renal Artery Stenosis, 07/31/2016 IMPRESSION: 1. No evidence of renal atrophy, urinary obstruction or evidence of renal artery stenosis. 2. **An incidental finding of potential clinical significance has been found. Approximately 9 cm mass within the left upper  abdominal quadrant - while indeterminate on this examination, this structure appears separate from the spleen and kidney and thus may be arising from the adrenal gland. Further evaluation with abdominal MRI is recommended.** 3. Bilateral renal cysts. 4. Suspected punctate (approximately 4 mm) nonobstructing left-sided renal stone. 5. Enlarged prostate with mass effect upon the undersurface of the urinary bladder.  ASSESSMENT & PLAN: 61 y.o. African American male with a gastrointestinal stromal tumor of the stomach. He underwent a partial gastrectomy 10/26/2016.   1. 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 -I have previously reviewed his surgical pathology finding with patient and his daughter. Per NCCN counseling, giving the size of the tumor, and high mitotic rate, he is at very high risk for recurrence (86%), and standard care is adjuvant imatinib 400 mg daily for 3 years, no need dose adjustments based on his current renal function. The benefit and potential side effects were discussed with patient in details. However he is very reluctant to take imatinib, due to the incidental finding of the tumor.  -His tumor ckit mutation was positive, which predicts could benefit of adjuvant imatinib  -Patient finally agreed to take  adjuvant Imatinib 400 mg daily for 3 years. He is tolerating very well, we'll continue -follow up in 3 months, repeat scan in 6 months   2. Chronic kidney disease, stage III -labs pending today, will notify patient of results at next visit.   3. Hypertension -Patient on norvasc and losartan currently. His blood pressure noted to be 159/99 in the office today (01/18/17).  -I advised the patient to follow up with his PCP regarding his blood pressure -I recommended that the pt check his blood pressure at home.   Plan:  -Continue imatinib  -follow up in 3 months    Truitt Merle, MD 01/20/2017 5:42 PM   This document  serves as a record of services personally performed by Truitt Merle, MD. It was created on her behalf by Steva Colder, a trained medical scribe. The creation of this record is based on the scribe's personal observations and the provider's statements to them. This document has been checked and approved by the attending provider.

## 2017-01-18 ENCOUNTER — Telehealth: Payer: Self-pay | Admitting: Hematology

## 2017-01-18 ENCOUNTER — Other Ambulatory Visit (HOSPITAL_BASED_OUTPATIENT_CLINIC_OR_DEPARTMENT_OTHER): Payer: BLUE CROSS/BLUE SHIELD

## 2017-01-18 ENCOUNTER — Ambulatory Visit (HOSPITAL_BASED_OUTPATIENT_CLINIC_OR_DEPARTMENT_OTHER): Payer: BLUE CROSS/BLUE SHIELD | Admitting: Hematology

## 2017-01-18 VITALS — BP 159/99 | HR 74 | Temp 98.9°F | Resp 18 | Ht 76.0 in | Wt 267.6 lb

## 2017-01-18 DIAGNOSIS — C49A2 Gastrointestinal stromal tumor of stomach: Secondary | ICD-10-CM | POA: Diagnosis not present

## 2017-01-18 DIAGNOSIS — I10 Essential (primary) hypertension: Secondary | ICD-10-CM

## 2017-01-18 DIAGNOSIS — N183 Chronic kidney disease, stage 3 (moderate): Secondary | ICD-10-CM | POA: Diagnosis not present

## 2017-01-18 LAB — CBC WITH DIFFERENTIAL/PLATELET
BASO%: 1.3 % (ref 0.0–2.0)
BASOS ABS: 0.1 10*3/uL (ref 0.0–0.1)
EOS ABS: 0.1 10*3/uL (ref 0.0–0.5)
EOS%: 3 % (ref 0.0–7.0)
HCT: 39.2 % (ref 38.4–49.9)
HGB: 12.8 g/dL — ABNORMAL LOW (ref 13.0–17.1)
LYMPH%: 20 % (ref 14.0–49.0)
MCH: 28.2 pg (ref 27.2–33.4)
MCHC: 32.6 g/dL (ref 32.0–36.0)
MCV: 86.6 fL (ref 79.3–98.0)
MONO#: 0.3 10*3/uL (ref 0.1–0.9)
MONO%: 7.2 % (ref 0.0–14.0)
NEUT#: 3.2 10*3/uL (ref 1.5–6.5)
NEUT%: 68.5 % (ref 39.0–75.0)
Platelets: 217 10*3/uL (ref 140–400)
RBC: 4.53 10*6/uL (ref 4.20–5.82)
RDW: 14 % (ref 11.0–14.6)
WBC: 4.7 10*3/uL (ref 4.0–10.3)
lymph#: 0.9 10*3/uL (ref 0.9–3.3)

## 2017-01-18 LAB — COMPREHENSIVE METABOLIC PANEL
ALT: 34 U/L (ref 0–55)
AST: 29 U/L (ref 5–34)
Albumin: 3.7 g/dL (ref 3.5–5.0)
Alkaline Phosphatase: 90 U/L (ref 40–150)
Anion Gap: 8 mEq/L (ref 3–11)
BUN: 29.8 mg/dL — ABNORMAL HIGH (ref 7.0–26.0)
CHLORIDE: 107 meq/L (ref 98–109)
CO2: 24 meq/L (ref 22–29)
Calcium: 9.3 mg/dL (ref 8.4–10.4)
Creatinine: 2.8 mg/dL — ABNORMAL HIGH (ref 0.7–1.3)
EGFR: 28 mL/min/{1.73_m2} — AB (ref >90)
GLUCOSE: 120 mg/dL (ref 70–140)
Potassium: 4.3 mEq/L (ref 3.5–5.1)
SODIUM: 140 meq/L (ref 136–145)
Total Bilirubin: 0.61 mg/dL (ref 0.20–1.20)
Total Protein: 7.4 g/dL (ref 6.4–8.3)

## 2017-01-18 LAB — MAGNESIUM: Magnesium: 2.3 mg/dl (ref 1.5–2.5)

## 2017-01-18 NOTE — Telephone Encounter (Signed)
Gave patient AVS and calendar of upcoming December appointments.  °

## 2017-01-19 ENCOUNTER — Other Ambulatory Visit: Payer: BLUE CROSS/BLUE SHIELD | Admitting: Internal Medicine

## 2017-01-19 DIAGNOSIS — Z8619 Personal history of other infectious and parasitic diseases: Secondary | ICD-10-CM

## 2017-01-20 ENCOUNTER — Encounter: Payer: Self-pay | Admitting: Hematology

## 2017-01-22 LAB — EXTRA LAV TOP TUBE

## 2017-01-22 LAB — HSV(HERPES SIMPLEX VRS) I + II AB-IGG
HAV 1 IGG,TYPE SPECIFIC AB: 12.4 index — ABNORMAL HIGH
HSV 2 IGG,TYPE SPECIFIC AB: 3.91 index — ABNORMAL HIGH

## 2017-01-24 ENCOUNTER — Other Ambulatory Visit (HOSPITAL_COMMUNITY): Payer: Self-pay | Admitting: Nephrology

## 2017-01-24 DIAGNOSIS — R809 Proteinuria, unspecified: Secondary | ICD-10-CM

## 2017-02-01 ENCOUNTER — Other Ambulatory Visit: Payer: Self-pay | Admitting: Internal Medicine

## 2017-02-09 MED FILL — IMATINIB MESYLATE 400 MG TA: 400 | 30 days supply | Qty: 30 | Fill #2

## 2017-02-13 ENCOUNTER — Other Ambulatory Visit: Payer: Self-pay | Admitting: Internal Medicine

## 2017-02-14 ENCOUNTER — Ambulatory Visit (HOSPITAL_COMMUNITY): Admission: RE | Admit: 2017-02-14 | Payer: BLUE CROSS/BLUE SHIELD | Source: Ambulatory Visit

## 2017-02-14 ENCOUNTER — Encounter: Payer: Self-pay | Admitting: Hematology

## 2017-03-02 ENCOUNTER — Other Ambulatory Visit: Payer: BLUE CROSS/BLUE SHIELD

## 2017-03-02 ENCOUNTER — Ambulatory Visit: Payer: BLUE CROSS/BLUE SHIELD | Admitting: Hematology

## 2017-03-07 MED FILL — IMATINIB MESYLATE 400 MG TA: 400 | 30 days supply | Qty: 30 | Fill #3

## 2017-03-15 ENCOUNTER — Telehealth: Payer: Self-pay | Admitting: Pharmacist

## 2017-03-15 NOTE — Telephone Encounter (Signed)
Oral Chemotherapy Pharmacist Encounter  Follow-Up Form  Called patient today to follow up regarding patient's oral chemotherapy medication: Gleevecfor the treatment of gastrointestinal stromal tumor, planned duration until disease progression or unacceptable toxicity.   Original Start date of oral chemotherapy: 12/27/16  Pt is doing well today  Pt reports 0 tablets/doses of Gleevec 400mg  tablets, 1 tablet by mouth once daily with a meal and a large glass of water missed in the last month.  Patient states he eats a large breakfast daily and then takes his [place] with a full glass of water.  Pt reports the following side effects: no side effects to report. When I asked about previously reported chest tightness, patient states this is not bothersome any longer, he does not notice it.  Pertinent labs reviewed: OK for continued treatment.  Other Issues: none to report  Patient knows to call the office with questions or concerns. Oral Oncology Clinic will continue to follow.  Thank you,  Larry Clements, PharmD, BCPS, BCOP 03/15/2017 12:16 PM Oral Oncology Clinic [phone]

## 2017-04-03 ENCOUNTER — Other Ambulatory Visit: Payer: Self-pay | Admitting: Hematology

## 2017-04-03 DIAGNOSIS — C49A2 Gastrointestinal stromal tumor of stomach: Secondary | ICD-10-CM

## 2017-04-04 MED FILL — IMATINIB MESYLATE 400 MG TA: 400 | 30 days supply | Qty: 30 | Fill #0

## 2017-04-19 ENCOUNTER — Other Ambulatory Visit: Payer: Self-pay | Admitting: Internal Medicine

## 2017-04-20 ENCOUNTER — Other Ambulatory Visit: Payer: BLUE CROSS/BLUE SHIELD

## 2017-04-20 ENCOUNTER — Ambulatory Visit: Payer: BLUE CROSS/BLUE SHIELD | Admitting: Hematology

## 2017-04-24 ENCOUNTER — Telehealth: Payer: Self-pay

## 2017-04-24 NOTE — Telephone Encounter (Signed)
Called and left a message with new appts

## 2017-04-30 NOTE — Progress Notes (Signed)
[place]  Telephone:([phone]) [phone] Fax:([phone]  Clinic Follow Up Note   Patient Care Team: Elby Showers, MD as PCP - General (Internal Medicine)   Date of Service:  05/01/2017  CHIEF COMPLAINTS/PURPOSE OF CONSULTATION:  Gastrointestinal stromal tumor of the stomach  Oncology History   Cancer Staging 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[place]) Staging form: Gastrointestinal Stromal Tumor - Gastric and Omental GIST, AJCC 8th Edition - Pathologic stage from 10/26/2016: Stage IIIB (pT4, pN0, cM0, Mitotic Rate: High) - Signed by Truitt Merle, MD on 01/20/2017       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[place])   08/18/2016 Imaging    Abdominal MRI with and without contrast showed a 10.3 cm mass in the left upper quadrant at adjacent to the gastric fundus      10/26/2016 Initial Diagnosis    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[place])      10/26/2016 Surgery    laparoscopic partial gastrectomy by Dr. 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 male recently diagnosed with a gastrointestinal stromal tumor of the stomach. Renal ultrasound on 07/31/2016 showed an incidental finding of an approximate 9 cm mass within the left upper abdominal quadrant. Follow-up MRI of the abdomen 08/17/2016 showed a 10.3 cm mass in the left upper quadrant adjacent to the gastric fundus interposed between the stomach and the spleen and tracking along the upper portion of the pancreatic tail. Endoscopic ultrasound by Dr. Ardis Hughs 09/21/2016 showed a large  hypoechoic heterogeneous mass directly abutting the gastric wall. The mass appeared to involve the muscularis propria layer of the gastric wall and measured at least 8.5 cm. Fine-needle aspiration of the mass showed gastrointestinal stromal tumor. On 10/26/2016 he underwent a laparoscopic partial gastrectomy by Dr. Barry Dienes. The mass was coming off the greater curve just posterior to the short gastric arteries. Final pathology showed a 10.5 cm gastrointestinal stromal tumor, spindle cell type; margins uninvolved by tumor. Mitotic rate 13/50 high-power field; grade 2.  CURRENT THERAPY: Adjuvant imatinib (Gleevec) 400 mg daily for 3 years, started on 12/27/2016  INTERVAL HISTORY:  Larry Clements is here for a follow up. He presents to the clinic today noting he is doing well. He is still taking Gleevec and tolerating well with no issues. He has not felt much change since being on Gleevec. He has improved appetite. He has been drinking water more. 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 He plans to see Dr. Barry Dienes soon.     MEDICAL HISTORY:  Past Medical History:  Diagnosis Date  . Allergy   . Chronic kidney disease    mass  . Chronic kidney disease, stage III (moderate)   . Gastrointestinal stromal tumor (GIST) ([place])   . HSV-2 (herpes simplex virus 2) infection   . Hypertension     SURGICAL HISTORY: Past Surgical History:  Procedure Laterality Date  . COLONOSCOPY    . EUS N/A 09/21/2016   Procedure: UPPER  ENDOSCOPIC ULTRASOUND (EUS) LINEAR;  Surgeon: Milus Banister, MD;  Location: [facility];  Service: Endoscopy;  Laterality: N/A;  . LAPAROSCOPIC GASTRECTOMY N/A 10/26/2016   Procedure: LAPAROSCOPIC PARTIAL GASTRECTOMY;  Surgeon: Stark Klein, MD;  Location: [facility];  Service: General;  Laterality: N/A;  . LAPAROSCOPIC PARTIAL GASTRECTOMY  10/26/2016  . SURGICAL EXCISION OF EXCESSIVE SKIN     fatty tissue    SOCIAL HISTORY: He lives in [place].  He is divorced. He has 1 son and 1 daughter. Both live locally. He is employed by [employer]. No tobacco or alcohol use.  FAMILY HISTORY: Family History  Problem Relation Age of Onset  . Diabetes Father   . Kidney failure Father   . Colon cancer Father   . Hypertension Mother   He reports his mother had Alzheimer's. Father may have had prostate cancer.   ALLERGIES:  No known allergies.  MEDICATIONS:  Current Outpatient Medications  Medication Sig Dispense Refill  . amLODipine (NORVASC) 10 MG tablet TAKE 1 TABLET (10 MG TOTAL) BY MOUTH DAILY. 90 tablet 3  . furosemide (LASIX) 40 MG tablet TAKE 1 TABLET (40 MG TOTAL) BY MOUTH DAILY. 90 tablet 0  . hydrALAZINE (APRESOLINE) 50 MG tablet Take 50 mg by mouth 3 (three) times daily.    Mar[place] Kitchen imatinib (GLEEVEC) 400 MG tablet TAKE 1 TABLET (400 MG TOTAL) BY MOUTH DAILY. TAKE WITH MEALS AND LARGE GLASS OF WATER. CAUTION:CHEMOTHERAPY. 30 tablet 3  . KLOR-CON M20 20 MEQ tablet TAKE 2 TABLETS BY MOUTH EVERY DAY 180 tablet 0  . labetalol (NORMODYNE) 300 MG tablet Take 300 mg by mouth every 8 (eight) hours.  5  . losartan (COZAAR) 100 MG tablet TAKE 1 TABLET BY MOUTH EVERY DAY 90 tablet 1   No current facility-administered medications for this visit.     REVIEW OF SYSTEMS:   Constitutional: Denies fevers, chills or abnormal night sweats Eyes: Denies blurriness of vision, double vision or watery eyes Ears, nose, mouth, throat, and face: Denies mucositis or sore throat Respiratory: Denies cough, dyspnea or wheezes Cardiovascular: Denies palpitation, chest discomfort (+) mild lower leg swelling bilaterally Gastrointestinal:  Denies nausea, heartburn or change in bowel habits Skin: Denies abnormal skin rashes Lymphatics: Denies new lymphadenopathy or easy bruising Neurological:Denies numbness, tingling or new weaknesses Behavioral/Psych: Mood is stable, no new changes  All other systems were reviewed with the patient and are negative.  PHYSICAL  EXAMINATION: ECOG PERFORMANCE STATUS: 0  Vitals:   05/01/17 1406  BP: (!) 166/84  Pulse: 66  Resp: 20  Temp: 98.1 F (36.7 C)  SpO2: 96%   Filed Weights   05/01/17 1406  Weight: 258 lb 9.6 oz (117.3 kg)    GENERAL:alert, no distress and comfortable SKIN: skin color, texture, turgor are normal, no rashes or significant lesions EYES: normal, conjunctiva are pink and non-injected, sclera clear OROPHARYNX:no exudate, no erythema and lips, buccal mucosa, and tongue normal  NECK: supple, thyroid normal size, non-tender, without nodularity LYMPH:  no palpable lymphadenopathy in the cervical, axillary or inguinal regions LUNGS: clear to auscultation and percussion with normal breathing effort HEART: regular rate & rhythm and no murmurs (+) mild lower leg edema bilaterally  ABDOMEN: Abdomen soft, non-tender and normal bowel sounds. Healed surgical midline incision. Musculoskeletal:no cyanosis of digits and no clubbing  PSYCH: alert & oriented x 3 with fluent speech NEURO: no focal motor/sensory deficits  LABORATORY DATA:  I have reviewed the data as listed CBC Latest Ref Rng & Units 05/01/2017 01/18/2017 10/29/2016  WBC 4.0 - 10.3 10e3/uL 4.2 4.7 5.9  Hemoglobin 13.0 - 17.1 g/dL 11.6(L) 12.8(L) 12.0(L)  Hematocrit 38.4 - 49.9 % 36.8(L) 39.2 38.1(L)  Platelets 140 - 400 10e3/uL 236 217 221   PATHOLOGY   Laparoscopic partial gastrectomy 10/26/2016  Diagnosis Stomach, resection for tumor, Greater curve - GASTROINTESTINAL STROMAL TUMOR, SPINDLE CELL TYPE, 10.5 CM - MARGINS UNINVOLVED BY TUMOR - SEE ONCOLOGY TABLE AND COMMENT BELOW Microscopic Comment GASTROINTESTINAL STROMAL TUMOR (GIST): Procedure: Resection, partial gastrectomy Tumor Site: Not specified Tumor Size: 10.5 cm Tumor Focality: Unifocal Histologic Type: Gastrointestinal stromal tumor, spindle cell type Mitotic Rate: 13 /50 HIGH POWER FIELD Necrosis: Not identified Histologic Grade: G2 (High grade; mitotic rate  >5/50 HIGH POWER FIELD) Risk Assessment: High Margins: Uninvolved by GIST (See comment) Ancillary testing: See [id number] Immunohistochemistry: CD117: positive Other: CD34 and vimentin positive Lymph nodes: No lymph nodes submitted or found Pathologic Staging: pT4 pN0 Comment: The edge of the GIST is grossly present in the stapled surgical margin; however, there is no evidence of GIST in the histologic sections of the margin. Therefore, the actual distance of the lesion from the stapled margin is not known.    RADIOLOGY:   MR Abdomen W WO Contrast, 08/18/2016 IMPRESSION: 1. A 10.3 cm mass in the left upper quadrant adjacent to the gastric fundus interposed between the stomach and spleen, and tracking along the upper margin of the pancreatic tail has low T1 and high T2 signal characteristics with diffuse enhancement. Based on the slight heterogeneity I favor a GI stromal tumor. Differential diagnostic considerations may include lymphoma, gastrointestinal carcinoid or schwannoma, or possibly a or air gastrointestinal leiomyosarcoma. I am skeptical of an adrenal origin, the mass abuts but does not appear to be arising from the adrenal gland lateral limb. Surgical referral recommended. 2. Bosniak category 51F lesion of the right kidney lateral mid to lower pole. This cystic lesion is probably benign but warrants surveillance. Renal protocol MRI with and without contrast recommended in 6 months time to reassess. This recommendation follows ACR consensus guidelines: Management of the Incidental Renal Mass on CT: A White Paper of the ACR Incidental Findings Committee. J Am Coll Radiol 8022;33:612-244. 3. Small umbilical hernia contains adipose tissue. 4. 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  US Renal and US Renal Artery Stenosis, 07/31/2016 IMPRESSION: 1. No evidence of renal atrophy, urinary obstruction or evidence of renal artery stenosis. 2. **An incidental finding  of potential clinical significance has been found. Approximately 9 cm mass within the left upper abdominal quadrant - while indeterminate on this examination, this structure appears separate from the spleen and kidney and thus may be arising from the adrenal gland. Further evaluation with abdominal MRI is recommended.** 3. Bilateral renal cysts. 4. Suspected punctate (approximately 4 mm) nonobstructing left-sided renal stone. 5. Enlarged prostate with mass effect upon the undersurface of the urinary bladder.  ASSESSMENT & PLAN: 61 y.o. African American male with a gastrointestinal stromal tumor of the stomach. He underwent a partial gastrectomy 10/26/2016.   1. 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 -I have previously reviewed his surgical pathology finding with patient and his daughter. Per NCCN counseling, giving the size of the tumor, and high mitotic rate, he is at very high risk for recurrence (86%), and standard care is adjuvant imatinib (Gleevec) 400 mg daily for 3 years, no need dose adjustments based on his current renal function. The benefit and potential side effects were discussed with patient  in details. However he is very reluctant to take imatinib, due to the incidental finding of the tumor.  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 He is tolerating very well, we'll continue -He has been tolerating Gleevec well so far with no issues. He has missed a few appointments. I advised him he needs to see [place] regularly while on medication to monitor his labs and continue surveillance. He understands.  -Labs reviewed, He has mild anemia at 11.6 that is likely related to surgery and Gleevec.  -Will PT is awaiting CT scan again in 10/2017 -follow up in 3 months  2. Chronic kidney disease, stage  III -01/18/17 labs show BUN at 29.8 and Cr at 2.8 -will continue to monitor  3. Hypertension -Patient on Norvasc and losartan currently. His blood pressure noted to be 159/99 in the office on 01/18/17.  -I advised the patient to follow up with his PCP regarding his blood pressure -I recommended that the pt check his blood pressure at home.   Plan:  -Continue imatinib  -follow up in 3 months with labs     Truitt Merle, MD 05/01/2017   This document serves as a record of services personally performed by Truitt Merle, MD. It was created on her behalf by Joslyn Devon, a trained medical scribe. The creation of this record is based on the scribe's personal observations and the provider's statements to them.    I have reviewed the above documentation for accuracy and completeness, and I agree with the above.

## 2017-05-01 ENCOUNTER — Ambulatory Visit: Payer: BLUE CROSS/BLUE SHIELD | Admitting: Hematology

## 2017-05-01 ENCOUNTER — Other Ambulatory Visit: Payer: Self-pay | Admitting: Internal Medicine

## 2017-05-01 ENCOUNTER — Telehealth: Payer: Self-pay | Admitting: Hematology

## 2017-05-01 ENCOUNTER — Other Ambulatory Visit (HOSPITAL_BASED_OUTPATIENT_CLINIC_OR_DEPARTMENT_OTHER): Payer: BLUE CROSS/BLUE SHIELD

## 2017-05-01 VITALS — BP 166/84 | HR 66 | Temp 98.1°F | Resp 20 | Wt 258.6 lb

## 2017-05-01 DIAGNOSIS — C49A2 Gastrointestinal stromal tumor of stomach: Secondary | ICD-10-CM

## 2017-05-01 DIAGNOSIS — I10 Essential (primary) hypertension: Secondary | ICD-10-CM

## 2017-05-01 DIAGNOSIS — N183 Chronic kidney disease, stage 3 (moderate): Secondary | ICD-10-CM | POA: Diagnosis not present

## 2017-05-01 LAB — CBC WITH DIFFERENTIAL/PLATELET
BASO%: 0.5 % (ref 0.0–2.0)
Basophils Absolute: 0 10*3/uL (ref 0.0–0.1)
EOS ABS: 0.2 10*3/uL (ref 0.0–0.5)
EOS%: 3.8 % (ref 0.0–7.0)
HCT: 36.8 % — ABNORMAL LOW (ref 38.4–49.9)
HGB: 11.6 g/dL — ABNORMAL LOW (ref 13.0–17.1)
LYMPH%: 23.3 % (ref 14.0–49.0)
MCH: 28.7 pg (ref 27.2–33.4)
MCHC: 31.5 g/dL — ABNORMAL LOW (ref 32.0–36.0)
MCV: 91.1 fL (ref 79.3–98.0)
MONO#: 0.5 10*3/uL (ref 0.1–0.9)
MONO%: 11.9 % (ref 0.0–14.0)
NEUT%: 60.5 % (ref 39.0–75.0)
NEUTROS ABS: 2.5 10*3/uL (ref 1.5–6.5)
Platelets: 236 10*3/uL (ref 140–400)
RBC: 4.04 10*6/uL — AB (ref 4.20–5.82)
RDW: 13.2 % (ref 11.0–14.6)
WBC: 4.2 10*3/uL (ref 4.0–10.3)
lymph#: 1 10*3/uL (ref 0.9–3.3)

## 2017-05-01 LAB — COMPREHENSIVE METABOLIC PANEL
ALT: 54 U/L (ref 0–55)
AST: 78 U/L — ABNORMAL HIGH (ref 5–34)
Albumin: 4.1 g/dL (ref 3.5–5.0)
Alkaline Phosphatase: 75 U/L (ref 40–150)
Anion Gap: 7 mEq/L (ref 3–11)
BILIRUBIN TOTAL: 0.8 mg/dL (ref 0.20–1.20)
BUN: 25.2 mg/dL (ref 7.0–26.0)
CALCIUM: 8.9 mg/dL (ref 8.4–10.4)
CO2: 25 meq/L (ref 22–29)
CREATININE: 2.3 mg/dL — AB (ref 0.7–1.3)
Chloride: 107 mEq/L (ref 98–109)
EGFR: 35 mL/min/{1.73_m2} — AB (ref >60)
GLUCOSE: 83 mg/dL (ref 70–140)
POTASSIUM: 3.9 meq/L (ref 3.5–5.1)
Sodium: 139 mEq/L (ref 136–145)
Total Protein: 7.3 g/dL (ref 6.4–8.3)

## 2017-05-01 NOTE — Telephone Encounter (Signed)
Gave avs and calendar for march 2019 

## 2017-05-02 ENCOUNTER — Encounter: Payer: Self-pay | Admitting: Hematology

## 2017-05-03 MED FILL — IMATINIB MESYLATE 400 MG TA: 400 | 30 days supply | Qty: 30 | Fill #1

## 2017-05-16 ENCOUNTER — Other Ambulatory Visit: Payer: Self-pay | Admitting: Radiology

## 2017-05-17 ENCOUNTER — Ambulatory Visit (HOSPITAL_COMMUNITY)
Admission: RE | Admit: 2017-05-17 | Discharge: 2017-05-17 | Disposition: A | Discharge status: Home / Self Care | Payer: BLUE CROSS/BLUE SHIELD | Source: Ambulatory Visit | Attending: Nephrology | Admitting: Nephrology

## 2017-05-17 ENCOUNTER — Encounter (HOSPITAL_COMMUNITY): Payer: Self-pay

## 2017-05-17 DIAGNOSIS — N183 Chronic kidney disease, stage 3 (moderate): Secondary | ICD-10-CM | POA: Diagnosis present

## 2017-05-17 DIAGNOSIS — R809 Proteinuria, unspecified: Secondary | ICD-10-CM | POA: Diagnosis present

## 2017-05-17 DIAGNOSIS — I129 Hypertensive chronic kidney disease with stage 1 through stage 4 chronic kidney disease, or unspecified chronic kidney disease: Secondary | ICD-10-CM | POA: Diagnosis present

## 2017-05-17 LAB — CBC
HEMATOCRIT: 38.9 % — AB (ref 39.0–52.0)
HEMOGLOBIN: 12.4 g/dL — AB (ref 13.0–17.0)
MCH: 29 pg (ref 26.0–34.0)
MCHC: 31.9 g/dL (ref 30.0–36.0)
MCV: 91.1 fL (ref 78.0–100.0)
Platelets: 241 10*3/uL (ref 150–400)
RBC: 4.27 MIL/uL (ref 4.22–5.81)
RDW: 12.9 % (ref 11.5–15.5)
WBC: 3.6 10*3/uL — ABNORMAL LOW (ref 4.0–10.5)

## 2017-05-17 LAB — APTT: APTT: 29 s (ref 24–36)

## 2017-05-17 LAB — PROTIME-INR
INR: 0.98
Prothrombin Time: 12.9 seconds (ref 11.4–15.2)

## 2017-05-17 MED ORDER — FENTANYL CITRATE (PF) 100 MCG/2ML IJ SOLN
INTRAMUSCULAR | Status: AC
  Filled 2017-05-17: qty 2

## 2017-05-17 MED ORDER — HYDRALAZINE HCL 20 MG/ML IJ SOLN
INTRAMUSCULAR | Status: AC
  Filled 2017-05-17: qty 1

## 2017-05-17 MED ORDER — SODIUM CHLORIDE 0.9 % IV SOLN: INTRAVENOUS | Status: DC

## 2017-05-17 MED ORDER — LIDOCAINE HCL (PF) 1 % IJ SOLN
INTRAMUSCULAR | Status: AC
  Filled 2017-05-17: qty 30

## 2017-05-17 MED ORDER — MIDAZOLAM HCL 2 MG/2ML IJ SOLN
INTRAMUSCULAR | Status: AC
Start: 2017-05-17 — End: 2017-05-17
  Filled 2017-05-17: qty 2

## 2017-05-17 MED ORDER — HYDROCODONE-ACETAMINOPHEN 5-325 MG PO TABS: 1.0000 | ORAL_TABLET | ORAL | Status: DC | PRN

## 2017-05-17 MED ORDER — MIDAZOLAM HCL 2 MG/2ML IJ SOLN
INTRAMUSCULAR | Status: AC | PRN
  Administered 2017-05-17 (×2): 1 mg via INTRAVENOUS

## 2017-05-17 MED ORDER — HYDRALAZINE HCL 20 MG/ML IJ SOLN
INTRAMUSCULAR | Status: AC | PRN
  Administered 2017-05-17: 10 mg via INTRAVENOUS

## 2017-05-17 MED ORDER — GELATIN ABSORBABLE 12-7 MM EX MISC
CUTANEOUS | Status: AC
  Filled 2017-05-17: qty 1

## 2017-05-17 MED ORDER — FENTANYL CITRATE (PF) 100 MCG/2ML IJ SOLN
INTRAMUSCULAR | Status: AC | PRN
  Administered 2017-05-17 (×2): 50 ug via INTRAVENOUS

## 2017-05-17 NOTE — Sedation Documentation (Signed)
Vital signs stable. 

## 2017-05-17 NOTE — Sedation Documentation (Signed)
Patient is resting comfortably. 

## 2017-05-17 NOTE — H&P (Signed)
Chief Complaint: Patient was seen in consultation today for renal biopsy at the request of [place]  Referring Physician(s): Dunham,Cynthia  Supervising Physician: Markus Daft  Patient Status: [facility] - Out-pt  History of Present Illness: Larry Clements is a 62 y.o. male being worked up for proteinuria. He is referred for US guided random renal biopsy. PMHx, meds, labs, allergies reviewed. Pt states he did not take all his BP meds this am, just a couple. Has been NPO Family at bedside  Past Medical History:  Diagnosis Date  . Allergy   . Chronic kidney disease    mass  . Chronic kidney disease, stage III (moderate) ([facility])   . Gastrointestinal stromal tumor (GIST) ([place])   . HSV-2 (herpes simplex virus 2) infection   . Hypertension     Past Surgical History:  Procedure Laterality Date  . COLONOSCOPY    . EUS N/A 09/21/2016   Procedure: UPPER ENDOSCOPIC ULTRASOUND (EUS) LINEAR;  Surgeon: Milus Banister, MD;  Location: [facility];  Service: Endoscopy;  Laterality: N/A;  . LAPAROSCOPIC GASTRECTOMY N/A 10/26/2016   Procedure: LAPAROSCOPIC PARTIAL GASTRECTOMY;  Surgeon: Stark Klein, MD;  Location: [facility];  Service: General;  Laterality: N/A;  . LAPAROSCOPIC PARTIAL GASTRECTOMY  10/26/2016  . SURGICAL EXCISION OF EXCESSIVE SKIN     fatty tissue    Allergies: No known allergies  Medications: Prior to Admission medications   Medication Sig Start Date End Date Taking? Authorizing Provider  amLODipine (NORVASC) 10 MG tablet TAKE 1 TABLET (10 MG TOTAL) BY MOUTH DAILY. 02/13/17  Yes Baxley, Cresenciano Lick, MD  furosemide (LASIX) 40 MG tablet TAKE 1 TABLET (40 MG TOTAL) BY MOUTH DAILY. 09/03/16  Yes Baxley, Cresenciano Lick, MD  hydrALAZINE (APRESOLINE) 50 MG tablet Take 50 mg by mouth 3 (three) times daily.   Yes [provider]  imatinib (GLEEVEC) 400 MG tablet TAKE 1 TABLET (400 MG TOTAL) BY MOUTH DAILY. TAKE WITH MEALS AND LARGE GLASS OF WATER. CAUTION:CHEMOTHERAPY. 04/03/17   Yes Truitt Merle, MD  KLOR-CON M20 20 MEQ tablet TAKE 2 TABLETS BY MOUTH EVERY DAY 05/01/17  Yes Baxley, Cresenciano Lick, MD  labetalol (NORMODYNE) 300 MG tablet Take 300 mg by mouth every 8 (eight) hours. 08/28/16  Yes [provider]  losartan (COZAAR) 100 MG tablet TAKE 1 TABLET BY MOUTH EVERY DAY 04/19/17  Yes Baxley, Cresenciano Lick, MD     Family History  Problem Relation Age of Onset  . Diabetes Father   . Kidney failure Father   . Colon cancer Father   . Hypertension Mother     Social History   Socioeconomic History  . Marital status: Single    Spouse name: None  . Number of children: None  . Years of education: None  . Highest education level: None  Social Needs  . Financial resource strain: None  . Food insecurity - worry: None  . Food insecurity - inability: None  . Transportation needs - medical: None  . Transportation needs - non-medical: None  Occupational History  . None  Tobacco Use  . Smoking status: Never Smoker  . Smokeless tobacco: Never Used  Substance and Sexual Activity  . Alcohol use: No    Alcohol/week: 0.0 oz  . Drug use: No  . Sexual activity: None  Other Topics Concern  . None  Social History Narrative  . None     Review of Systems: A 12 point ROS discussed and pertinent positives are indicated in the HPI above.  All other systems are negative.  Review of Systems  Vital Signs: BP (!) 164/93   Pulse 65   Temp 97.9 F (36.6 C) (Oral)   Ht 6\' 4"  (1.93 m)   Wt 265 lb (120.2 kg)   SpO2 98%   BMI 32.26 kg/m   Physical Exam  Constitutional: He is oriented to person, place, and time. He appears well-developed. No distress.  HENT:  Head: Normocephalic.  Mouth/Throat: Oropharynx is clear and moist.  Neck: Normal range of motion. No JVD present. No tracheal deviation present.  Cardiovascular: Normal rate, regular rhythm and normal heart sounds.  Pulmonary/Chest: Effort normal and breath sounds normal. No respiratory distress.  Abdominal: Soft.  He exhibits no distension. There is no tenderness.  Neurological: He is alert and oriented to person, place, and time.  Skin: Skin is warm and dry.  Psychiatric: He has a normal mood and affect.    Imaging: No results found.  Labs:  CBC: Recent Labs    10/29/16 0254 01/18/17 1523 05/01/17 1354 05/17/17 0601  WBC 5.9 4.7 4.2 3.6*  HGB 12.0* 12.8* 11.6* 12.4*  HCT 38.1* 39.2 36.8* 38.9*  PLT 221 217 236 241    COAGS: Recent Labs    10/20/16 0941 05/17/17 0601  INR 1.07 0.98  APTT  --  29    BMP: Recent Labs    10/20/16 0941 10/26/16 1233 10/27/16 0703 10/28/16 0501 10/29/16 0254 01/18/17 1523 05/01/17 1354  NA 139  --  134* 132* 134* 140 139  K 3.5  --  4.3 4.0 4.0 4.3 3.9  CL 107  --  101 99* 101  --   --   CO2 25  --  27 26 27 24 25   GLUCOSE 101*  --  139* 141* 111* 120 83  BUN 22*  --  17 18 21* 29.8* 25.2  CALCIUM 9.3  --  8.9 8.7* 8.7* 9.3 8.9  CREATININE 1.85* 1.95* 1.97* 2.03* 1.87* 2.8* 2.3*  GFRNONAA 38* 36* 35* 34* 38*  --   --   GFRAA 44* 42* 41* 40* 44*  --   --     LIVER FUNCTION TESTS: Recent Labs    05/23/16 1207 01/18/17 1523 05/01/17 1354  BILITOT 0.6 0.61 0.80  AST 22 29 78*  ALT 29 34 54  ALKPHOS 88 90 75  PROT 7.4 7.4 7.3  ALBUMIN 3.7 3.7 4.1    TUMOR MARKERS: No results for input(s): AFPTM, CEA, CA199, CHROMGRNA in the last 8760 hours.  Assessment and Plan: Proteinuria For US guided random renal biopsy Labs ok BP elevated, will probably need to give IV hydralazine prior to procedure Risks and benefits discussed with the patient including, but not limited to bleeding, infection, damage to adjacent structures or low yield requiring additional tests. All of the patient's questions were answered, patient is agreeable to proceed. Consent signed and in chart.    Thank you for this interesting consult.  I greatly enjoyed meeting Isaiah Cianci and look forward to participating in their care.  A copy of this report was  sent to the requesting provider on this date.  Electronically Signed: Ascencion Dike, PA-C 05/17/2017, 7:34 AM   I spent a total of 20 minutes in face to face in clinical consultation, greater than 50% of which was counseling/coordinating care for renal biopsy

## 2017-05-17 NOTE — Discharge Instructions (Addendum)

## 2017-05-17 NOTE — Procedures (Signed)
US guided core biopsies of right kidney lower pole.  3 cores obtained.  Minimal blood loss and no immediate complication.  Plan for bedrest for 4 hours.

## 2017-05-18 DIAGNOSIS — N022 Recurrent and persistent hematuria with diffuse membranous glomerulonephritis: Secondary | ICD-10-CM

## 2017-05-18 HISTORY — DX: Recurrent and persistent hematuria with diffuse membranous glomerulonephritis: N02.2

## 2017-05-28 ENCOUNTER — Other Ambulatory Visit: Payer: Self-pay | Admitting: Internal Medicine

## 2017-05-28 DIAGNOSIS — I10 Essential (primary) hypertension: Secondary | ICD-10-CM

## 2017-05-28 DIAGNOSIS — E781 Pure hyperglyceridemia: Secondary | ICD-10-CM

## 2017-05-28 DIAGNOSIS — Z1329 Encounter for screening for other suspected endocrine disorder: Secondary | ICD-10-CM

## 2017-05-28 DIAGNOSIS — Z0000 Encounter for general adult medical examination without abnormal findings: Secondary | ICD-10-CM

## 2017-05-28 DIAGNOSIS — Z1321 Encounter for screening for nutritional disorder: Secondary | ICD-10-CM

## 2017-05-29 ENCOUNTER — Encounter (HOSPITAL_COMMUNITY): Payer: Self-pay

## 2017-05-29 MED FILL — IMATINIB MESYLATE 400 MG TA: 400 | 30 days supply | Qty: 30 | Fill #2

## 2017-06-18 ENCOUNTER — Ambulatory Visit (INDEPENDENT_AMBULATORY_CARE_PROVIDER_SITE_OTHER): Payer: BLUE CROSS/BLUE SHIELD | Admitting: Internal Medicine

## 2017-06-18 ENCOUNTER — Encounter: Payer: Self-pay | Admitting: Internal Medicine

## 2017-06-18 VITALS — BP 156/84 | HR 78 | Ht 76.0 in | Wt 265.0 lb

## 2017-06-18 DIAGNOSIS — Z0000 Encounter for general adult medical examination without abnormal findings: Secondary | ICD-10-CM | POA: Diagnosis not present

## 2017-06-18 DIAGNOSIS — R809 Proteinuria, unspecified: Secondary | ICD-10-CM | POA: Diagnosis not present

## 2017-06-18 DIAGNOSIS — Z6832 Body mass index (BMI) 32.0-32.9, adult: Secondary | ICD-10-CM

## 2017-06-18 DIAGNOSIS — Z8619 Personal history of other infectious and parasitic diseases: Secondary | ICD-10-CM

## 2017-06-18 DIAGNOSIS — N183 Chronic kidney disease, stage 3 unspecified: Secondary | ICD-10-CM

## 2017-06-18 DIAGNOSIS — Z125 Encounter for screening for malignant neoplasm of prostate: Secondary | ICD-10-CM

## 2017-06-18 DIAGNOSIS — I129 Hypertensive chronic kidney disease with stage 1 through stage 4 chronic kidney disease, or unspecified chronic kidney disease: Secondary | ICD-10-CM | POA: Diagnosis not present

## 2017-06-18 DIAGNOSIS — Z8509 Personal history of malignant neoplasm of other digestive organs: Secondary | ICD-10-CM | POA: Diagnosis not present

## 2017-06-18 NOTE — Progress Notes (Signed)
   Subjective:    Patient ID: Larry Clements, male    DOB: 1955/08/29, 62 y.o.   MRN: [id number]  HPI  62 year old Male for health maintenance exam and evaluation of medical issues. Hx  HTN and CKD. Follwed by Dr. Lorrene Reid.  Hypertension was long-standing and difficult to control.  He was eventually referred to Dr. Lorrene Reid who found that he had a brother who had been on dialysis father who was also on dialysis.  Uncle on dialysis as well.  Family members were felt to have familial FSGS but were never biopsied.  He wants to have renal biopsy in the near future.  He had resection of a 9 cm left upper abdominal mass by Dr. Barry Dienes July 2018 and this turned out to be a GIST tumor and he is now on treatment per with [facility] per Oncology.  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  2 adult children.  Divorced.  Resides alone.  Multiple cyst right kidney.  Left kidney normal.  Currently on amlodipine, losartan, hydralazine, furosemide, labetalol and potassium supplement.  Family history: Hypertension in mother father brother and sisters.  Kidney disease and uncle brother father with end-stage renal disease on hemodialysis we discontinued that.  Heart disease in father.  Brother with metastatic carcinoid syndrome treated with octreotide.  History of normocytic anemia likely due to chronic kidney disease.  He has mild hyperparathyroidism (secondary )  Has returned to work full-time since GIST tumor removed and is getting his strength back.  Feeling better.  Patient wants herpes titers drawn although I told him I did not think these were very reliable.  Denies any recent outbreaks. Review of Systems     Objective:   Physical Exam  Constitutional: He is oriented to person, place, and time. He appears well-developed and well-nourished. No distress.  HENT:  Head: Normocephalic and atraumatic.  Right Ear: External ear normal.  Mouth/Throat: Oropharynx  is clear and moist. No oropharyngeal exudate.  Eyes: Pupils are equal, round, and reactive to light. Right eye exhibits no discharge. Left eye exhibits no discharge.  Neck: Neck supple. No JVD present. No thyromegaly present.  Cardiovascular: Normal rate, regular rhythm and normal heart sounds.  Pulmonary/Chest: No respiratory distress. He has no wheezes. He has no rales.  Abdominal: Bowel sounds are normal. He exhibits no distension. There is no tenderness. There is no rebound.  Well-healed abdominal incision  Genitourinary:  Genitourinary Comments: Prostate slightly enlarged without nodules  Musculoskeletal: He exhibits no edema.  Lymphadenopathy:    He has no cervical adenopathy.  Neurological: He is alert and oriented to person, place, and time. He has normal reflexes. No cranial nerve deficit. Coordination normal.  Skin: Skin is warm and dry. He is not diaphoretic.  Psychiatric: He has a normal mood and affect. His behavior is normal. Judgment and thought content normal.  Vitals reviewed.         Assessment & Plan:  Status post removal of GIST tumor July 2018 now on [place] per Oncology  Elevated PSA and slightly enlarged prostate-suspect he has BPH.  Refer to urology.  Chronic kidney disease stage III followed by Dr. Lorrene Reid.  Is not had biopsy yet.  Hypertension-renal  Elevated serum creatinine  Proteinuria  BMI 32-work on diet and exercise  Plan: Return here at least yearly otherwise see Dr. Lorrene Reid for close follow-up.  Continue oncology follow-up.

## 2017-06-19 LAB — POCT URINALYSIS DIPSTICK
APPEARANCE: NEGATIVE
BILIRUBIN UA: NEGATIVE
GLUCOSE UA: NEGATIVE
Ketones, UA: NEGATIVE
LEUKOCYTES UA: NEGATIVE
Nitrite, UA: NEGATIVE
ODOR: NEGATIVE
PH UA: 6 (ref 5.0–8.0)
Protein, UA: NEGATIVE
RBC UA: NEGATIVE
Spec Grav, UA: 1.015 (ref 1.010–1.025)
UROBILINOGEN UA: 0.2 U/dL

## 2017-06-21 MED FILL — IMATINIB MESYLATE 400 MG TA: 400 | 30 days supply | Qty: 30 | Fill #3

## 2017-06-22 ENCOUNTER — Other Ambulatory Visit: Payer: BLUE CROSS/BLUE SHIELD | Admitting: Internal Medicine

## 2017-06-22 DIAGNOSIS — Z0000 Encounter for general adult medical examination without abnormal findings: Secondary | ICD-10-CM

## 2017-06-22 DIAGNOSIS — Z8619 Personal history of other infectious and parasitic diseases: Secondary | ICD-10-CM

## 2017-06-22 DIAGNOSIS — Z125 Encounter for screening for malignant neoplasm of prostate: Secondary | ICD-10-CM

## 2017-06-22 DIAGNOSIS — Z1321 Encounter for screening for nutritional disorder: Secondary | ICD-10-CM

## 2017-06-22 DIAGNOSIS — E781 Pure hyperglyceridemia: Secondary | ICD-10-CM

## 2017-06-22 DIAGNOSIS — Z1329 Encounter for screening for other suspected endocrine disorder: Secondary | ICD-10-CM

## 2017-06-25 LAB — LIPID PANEL
CHOL/HDL RATIO: 2.6 (calc) (ref <5.0)
Cholesterol: 142 mg/dL (ref <200)
HDL: 54 mg/dL (ref >40)
LDL CHOLESTEROL (CALC): 73 mg/dL
Non-HDL Cholesterol (Calc): 88 mg/dL (calc) (ref <130)
Triglycerides: 71 mg/dL (ref <150)

## 2017-06-25 LAB — HSV(HERPES SIMPLEX VRS) I + II AB-IGG
HAV 1 IGG,TYPE SPECIFIC AB: 12.3 index — ABNORMAL HIGH
HSV 2 IGG, TYPE SPEC: 2.58 {index} — AB

## 2017-06-25 LAB — TSH: TSH: 4.36 m[IU]/L (ref 0.40–4.50)

## 2017-06-25 LAB — PSA: PSA: 4.8 ng/mL — ABNORMAL HIGH (ref <=4.0)

## 2017-06-25 LAB — VITAMIN D 25 HYDROXY (VIT D DEFICIENCY, FRACTURES): Vit D, 25-Hydroxy: 37 ng/mL (ref 30–100)

## 2017-07-11 ENCOUNTER — Encounter: Payer: Self-pay | Admitting: Internal Medicine

## 2017-07-11 DIAGNOSIS — N183 Chronic kidney disease, stage 3 unspecified: Secondary | ICD-10-CM | POA: Insufficient documentation

## 2017-07-11 NOTE — Patient Instructions (Addendum)
Needs to try to lose a bit of weight.  Continue prescriptions as directed by Dr. Lorrene Reid for hypertension and chronic kidney disease.  Return here at least yearly

## 2017-07-16 ENCOUNTER — Other Ambulatory Visit: Payer: Self-pay | Admitting: Hematology

## 2017-07-16 DIAGNOSIS — C49A2 Gastrointestinal stromal tumor of stomach: Secondary | ICD-10-CM

## 2017-07-17 ENCOUNTER — Other Ambulatory Visit: Payer: Self-pay | Admitting: *Deleted

## 2017-07-17 DIAGNOSIS — C49A2 Gastrointestinal stromal tumor of stomach: Secondary | ICD-10-CM

## 2017-07-17 MED ORDER — IMATINIB MESYLATE 400 MG PO TABS: 400.0000 mg | ORAL_TABLET | Freq: Every day | ORAL | 3 refills | Status: DC

## 2017-07-17 MED FILL — IMATINIB MESYLATE 400 MG TA: 400 | 30 days supply | Qty: 30 | Fill #0

## 2017-07-25 ENCOUNTER — Other Ambulatory Visit: Payer: Self-pay | Admitting: Internal Medicine

## 2017-07-30 ENCOUNTER — Other Ambulatory Visit: Payer: BLUE CROSS/BLUE SHIELD

## 2017-07-30 ENCOUNTER — Encounter: Payer: Self-pay | Admitting: Nephrology

## 2017-07-30 ENCOUNTER — Ambulatory Visit: Payer: BLUE CROSS/BLUE SHIELD | Admitting: Hematology

## 2017-08-11 NOTE — Progress Notes (Signed)
[facility]  [phone] Fax:([phone]) [phone]  Clinic Follow Up Note   Patient Care Team: Elby Showers, MD as PCP - General (Internal Medicine)   Date of Service:  08/13/2017  CHIEF COMPLAINTS:  Follow up Gastrointestinal stromal tumor of the stomach  Oncology History   Cancer Staging 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[place]) Staging form: Gastrointestinal Stromal Tumor - Gastric and Omental GIST, AJCC 8th Edition - Pathologic stage from 10/26/2016: Stage IIIB (pT4, pN0, cM0, Mitotic Rate: High) - Signed by Truitt Merle, MD on 01/20/2017       HISTORY OF PRESENTING ILLNESS:  Larry Clements 62 y.o. male recently diagnosed with a gastrointestinal stromal tumor of the stomach. Renal ultrasound on 07/31/2016 showed an incidental finding of an approximate 9 cm mass within the left upper abdominal quadrant. Follow-up MRI of the abdomen 08/17/2016 showed a 10.3 cm mass in the left upper quadrant adjacent to the gastric fundus interposed between the stomach and the spleen and tracking along the upper portion of the pancreatic tail. Endoscopic ultrasound by Dr. Ardis Hughs 09/21/2016 showed a large hypoechoic heterogeneous mass directly abutting the gastric wall. The mass appeared to involve the muscularis propria layer of the gastric wall and measured at least 8.5 cm. Fine-needle aspiration of the mass showed gastrointestinal stromal tumor. On 10/26/2016 he underwent a laparoscopic partial gastrectomy by Dr. Barry Dienes. The mass was coming off the greater curve just posterior to the short gastric arteries. Final pathology showed a 10.5 cm gastrointestinal stromal tumor, spindle cell type; margins uninvolved by tumor. Mitotic rate 13/50 high-power field; grade 2.  CURRENT THERAPY: Adjuvant imatinib (Gleevec) 400 mg daily for 3 years, started on 12/27/2016  INTERVAL HISTORY:  Larry Clements is here for a follow up of his GIST. He presents to the clinic  today by himself. He reports he is doing well overall. He is compliant with Gleevec and reports no complaints. He endorses a good appetite. He is trying to stay active by walking his dog and he is active at work for [employer].   On review of systems, pt denies abdominal pain, nausea, abdominal bloating or any other complaints at this time. Pertinent positives are listed and detailed within the above HPI.   MEDICAL HISTORY:  Past Medical History:  Diagnosis Date  . Chronic kidney disease, stage III (moderate) (HCC)   . Gastrointestinal stromal tumor (GIST) ([place])   . Hypertension   . 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 PLA2R staining +. 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 COLONOSCOPY    . EUS N/A 09/21/2016   Procedure: UPPER ENDOSCOPIC ULTRASOUND (EUS) LINEAR;  Surgeon: Milus Banister, MD;  Location: [facility];  Service: Endoscopy;  Laterality: N/A;  . LAPAROSCOPIC GASTRECTOMY N/A 10/26/2016   Procedure: LAPAROSCOPIC PARTIAL GASTRECTOMY;  Surgeon: Stark Klein, MD;  Location: [facility];  Service: General;  Laterality: N/A;  . LAPAROSCOPIC PARTIAL GASTRECTOMY  10/26/2016  . SURGICAL EXCISION OF EXCESSIVE SKIN     fatty tissue    SOCIAL HISTORY: He lives in [place]. He is divorced. He has 1 son and 1 daughter. Both live locally. He is employed by [employer]. No tobacco or alcohol use.  FAMILY HISTORY: Family History  Problem Relation Age of Onset  . Diabetes Father   . Kidney failure Father   . Colon cancer Father   .  Hypertension Mother   He reports his mother had Alzheimer's. Father may have had prostate cancer.   ALLERGIES:  No known allergies.  MEDICATIONS:  Current Outpatient Medications  Medication Sig Dispense Refill  . amLODipine (NORVASC) 10 MG tablet TAKE 1 TABLET (10 MG TOTAL) BY MOUTH  DAILY. 90 tablet 3  . furosemide (LASIX) 40 MG tablet TAKE 1 TABLET (40 MG TOTAL) BY MOUTH DAILY. 90 tablet 0  . hydrALAZINE (APRESOLINE) 50 MG tablet Take 50 mg by mouth 3 (three) times daily.    Marland Kitchen imatinib (GLEEVEC) 400 MG tablet Take 1 tablet (400 mg total) by mouth daily. Take with meals and large glass of water.Caution:Chemotherapy. 30 tablet 3  . KLOR-CON M20 20 MEQ tablet TAKE 2 TABLETS BY MOUTH EVERY DAY 180 tablet 0  . labetalol (NORMODYNE) 300 MG tablet Take 300 mg by mouth every 8 (eight) hours.  5  . losartan (COZAAR) 100 MG tablet TAKE 1 TABLET BY MOUTH EVERY DAY 90 tablet 1   No current facility-administered medications for this visit.     REVIEW OF SYSTEMS:   Constitutional: Denies fevers, chills or abnormal night sweats (+) good energy and appetite Eyes: Denies blurriness of vision, double vision or watery eyes Ears, nose, mouth, throat, and face: Denies mucositis or sore throat Respiratory: Denies cough, dyspnea or wheezes Cardiovascular: Denies palpitation, chest discomfort  Gastrointestinal:  Denies nausea, heartburn or change in bowel habits Skin: Denies abnormal skin rashes Lymphatics: Denies new lymphadenopathy or easy bruising Neurological:Denies numbness, tingling or new weaknesses Behavioral/Psych: Mood is stable, no new changes  All other systems were reviewed with the patient and are negative.  PHYSICAL EXAMINATION: ECOG PERFORMANCE STATUS: 0  Vitals:   08/13/17 1417  BP: (!) 147/87  Pulse: 73  Resp: 17  Temp: 98.1 F (36.7 C)  SpO2: 100%   Filed Weights   08/13/17 1417  Weight: 262 lb 8 oz (119.1 kg)    GENERAL:alert, no distress and comfortable SKIN: skin color, texture, turgor are normal, no rashes or significant lesions EYES: normal, conjunctiva are pink and non-injected, sclera clear OROPHARYNX:no exudate, no erythema and lips, buccal mucosa, and tongue normal  NECK: supple, thyroid normal size, non-tender, without nodularity LYMPH:  no  palpable lymphadenopathy in the cervical, axillary or inguinal regions LUNGS: clear to auscultation and percussion with normal breathing effort HEART: regular rate & rhythm and no murmurs (+) mild lower leg edema bilaterally  ABDOMEN: Abdomen soft, non-tender and normal bowel sounds. Healed surgical midline incision. Musculoskeletal:no cyanosis of digits and no clubbing  PSYCH: alert & oriented x 3 with fluent speech NEURO: no focal motor/sensory deficits  LABORATORY DATA:  I have reviewed the data as listed CBC Latest Ref Rng & Units 08/13/2017 05/17/2017 05/01/2017  WBC 4.0 - 10.3 K/uL 4.0 3.6(L) 4.2  Hemoglobin 13.0 - 17.1 g/dL 11.7(L) 12.4(L) 11.6(L)  Hematocrit 38.4 - 49.9 % 36.2(L) 38.9(L) 36.8(L)  Platelets 140 - 400 K/uL 266 241 236   PATHOLOGY   Laparoscopic partial gastrectomy 10/26/2016  Diagnosis Stomach, resection for tumor, Greater curve - GASTROINTESTINAL STROMAL TUMOR, SPINDLE CELL TYPE, 10.5 CM - MARGINS UNINVOLVED BY TUMOR - SEE ONCOLOGY TABLE AND COMMENT BELOW Microscopic Comment GASTROINTESTINAL STROMAL TUMOR (GIST): Procedure: Resection, partial gastrectomy Tumor Site: Not specified Tumor Size: 10.5 cm Tumor Focality: Unifocal Histologic Type: Gastrointestinal stromal tumor, spindle cell type Mitotic Rate: 13 /50 HIGH POWER FIELD Necrosis: Not identified Histologic Grade: G2 (High grade; mitotic rate >5/50 HIGH POWER FIELD) Risk Assessment: High Margins: Uninvolved by  GIST (See comment) Ancillary testing: See [phone] Immunohistochemistry: CD117: positive Other: CD34 and vimentin positive Lymph nodes: No lymph nodes submitted or found Pathologic Staging: pT4 pN0 Comment: The edge of the GIST is grossly present in the stapled surgical margin; however, there is no evidence of GIST in the histologic sections of the margin. Therefore, the actual distance of the lesion from the stapled margin is not known.    RADIOLOGY:   MR Abdomen W WO Contrast,  08/18/2016 IMPRESSION: 1. A 10.3 cm mass in the left upper quadrant adjacent to the gastric fundus interposed between the stomach and spleen, and tracking along the upper margin of the pancreatic tail has low T1 and high T2 signal characteristics with diffuse enhancement. Based on the slight heterogeneity I favor a GI stromal tumor. Differential diagnostic considerations may include lymphoma, gastrointestinal carcinoid or schwannoma, or possibly a or air gastrointestinal leiomyosarcoma. I am skeptical of an adrenal origin, the mass abuts but does not appear to be arising from the adrenal gland lateral limb. Surgical referral recommended. 2. Bosniak category 53F lesion of the right kidney lateral mid to lower pole. This cystic lesion is probably benign but warrants surveillance. Renal protocol MRI with and without contrast recommended in 6 months time to reassess. This recommendation follows ACR consensus guidelines: Management of the Incidental Renal Mass on CT: A White Paper of the ACR Incidental Findings Committee. J Am Coll Radiol 5956;38:756-433. 3. Small umbilical hernia contains adipose tissue. 4. 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  US Renal and US Renal Artery Stenosis, 07/31/2016 IMPRESSION: 1. No evidence of renal atrophy, urinary obstruction or evidence of renal artery stenosis. 2. **An incidental finding of potential clinical significance has been found. Approximately 9 cm mass within the left upper abdominal quadrant - while indeterminate on this examination, this structure appears separate from the spleen and kidney and thus may be arising from the adrenal gland. Further evaluation with abdominal MRI is recommended.** 3. Bilateral renal cysts. 4. Suspected punctate (approximately 4 mm) nonobstructing left-sided renal stone. 5. Enlarged prostate with mass effect upon the undersurface of the urinary bladder.  ASSESSMENT & PLAN: 62 y.o. African American  male with a gastrointestinal stromal tumor of the stomach. He underwent a partial gastrectomy 10/26/2016.   1. 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 -I have previously reviewed his surgical pathology finding with patient and his daughter. Per NCCN counseling, giving the size of the tumor, and high mitotic rate, he is at very high risk for recurrence (86%), and standard care is adjuvant imatinib (Gleevec) 400 mg daily for 3 years, no need dose adjustments based on his current renal function. The benefit and potential side effects were discussed with patient in details. However he is very reluctant to take imatinib, due to the incidental finding of the tumor.  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 He is tolerating very well, we'll continue -He has been tolerating Gleevec well so far with no issues. He has missed a few appointments. I previously advised him he needs to see [place] regularly while on medication to monitor his labs and continue surveillance. He understands.  -Labs reviewed, He has mild anemia at 11.7 that is likely related to surgery and Gleevec. His liver enzyme AST is trending down -Continue surveillance, will obtain his first surveillance CT AP W Contrast  in 08/2017, he will call me to go over the results -Continue Gleevec -follow up in 3 months  2. Chronic kidney disease, stage III -01/18/17 labs show BUN at 29.8 and Cr at 2.8 -will continue to monitor  -Cr 2.51 today, normal BUN and decreased GFRs (08/13/17) but this is stable  3. Hypertension -Patient on Norvasc and losartan currently. His blood pressure noted to be 159/99 in the office on 01/18/17.  -I previously advised the patient to follow up with his PCP regarding his blood pressure -I again recommended that the pt check his blood  pressure at home.   Plan:  -Continue imatinib  -CT AP W Contrast this month, he will call me to go over results -follow up in 3 months with labs  This document serves as a record of services personally performed by Truitt Merle, MD. It was created on her behalf by Theresia Bough, a trained medical scribe. The creation of this record is based on the scribe's personal observations and the provider's statements to them.   I have reviewed the above documentation for accuracy and completeness, and I agree with the above.    Truitt Merle, MD 08/13/2017

## 2017-08-13 ENCOUNTER — Other Ambulatory Visit: Payer: BLUE CROSS/BLUE SHIELD

## 2017-08-13 ENCOUNTER — Inpatient Hospital Stay: Payer: BLUE CROSS/BLUE SHIELD

## 2017-08-13 ENCOUNTER — Telehealth: Payer: Self-pay | Admitting: Hematology

## 2017-08-13 ENCOUNTER — Encounter: Payer: Self-pay | Admitting: Hematology

## 2017-08-13 ENCOUNTER — Inpatient Hospital Stay: Payer: BLUE CROSS/BLUE SHIELD | Attending: Hematology | Admitting: Hematology

## 2017-08-13 VITALS — BP 147/87 | HR 73 | Temp 98.1°F | Resp 17 | Ht 76.0 in | Wt 262.5 lb

## 2017-08-13 DIAGNOSIS — N022 Recurrent and persistent hematuria with diffuse membranous glomerulonephritis: Secondary | ICD-10-CM | POA: Diagnosis not present

## 2017-08-13 DIAGNOSIS — K429 Umbilical hernia without obstruction or gangrene: Secondary | ICD-10-CM | POA: Diagnosis not present

## 2017-08-13 DIAGNOSIS — Z79899 Other long term (current) drug therapy: Secondary | ICD-10-CM

## 2017-08-13 DIAGNOSIS — Z9221 Personal history of antineoplastic chemotherapy: Secondary | ICD-10-CM

## 2017-08-13 DIAGNOSIS — D649 Anemia, unspecified: Secondary | ICD-10-CM

## 2017-08-13 DIAGNOSIS — N400 Enlarged prostate without lower urinary tract symptoms: Secondary | ICD-10-CM | POA: Diagnosis not present

## 2017-08-13 DIAGNOSIS — N183 Chronic kidney disease, stage 3 (moderate): Secondary | ICD-10-CM

## 2017-08-13 DIAGNOSIS — N281 Cyst of kidney, acquired: Secondary | ICD-10-CM | POA: Diagnosis not present

## 2017-08-13 DIAGNOSIS — C49A2 Gastrointestinal stromal tumor of stomach: Secondary | ICD-10-CM

## 2017-08-13 DIAGNOSIS — M5137 Other intervertebral disc degeneration, lumbosacral region: Secondary | ICD-10-CM

## 2017-08-13 DIAGNOSIS — I129 Hypertensive chronic kidney disease with stage 1 through stage 4 chronic kidney disease, or unspecified chronic kidney disease: Secondary | ICD-10-CM | POA: Diagnosis not present

## 2017-08-13 DIAGNOSIS — N189 Chronic kidney disease, unspecified: Secondary | ICD-10-CM | POA: Diagnosis not present

## 2017-08-13 DIAGNOSIS — I701 Atherosclerosis of renal artery: Secondary | ICD-10-CM

## 2017-08-13 DIAGNOSIS — C49A0 Gastrointestinal stromal tumor, unspecified site: Secondary | ICD-10-CM

## 2017-08-13 DIAGNOSIS — D690 Allergic purpura: Secondary | ICD-10-CM

## 2017-08-13 LAB — COMPREHENSIVE METABOLIC PANEL
ALBUMIN: 3.8 g/dL (ref 3.5–5.0)
ALK PHOS: 74 U/L (ref 40–150)
ALT: 39 U/L (ref 0–55)
AST: 42 U/L — AB (ref 5–34)
Anion gap: 7 (ref 3–11)
BILIRUBIN TOTAL: 0.6 mg/dL (ref 0.2–1.2)
BUN: 25 mg/dL (ref 7–26)
CALCIUM: 8.8 mg/dL (ref 8.4–10.4)
CO2: 27 mmol/L (ref 22–29)
CREATININE: 2.51 mg/dL — AB (ref 0.70–1.30)
Chloride: 105 mmol/L (ref 98–109)
GFR calc Af Amer: 30 mL/min — ABNORMAL LOW (ref >60)
GFR calc non Af Amer: 26 mL/min — ABNORMAL LOW (ref >60)
Glucose, Bld: 100 mg/dL (ref 70–140)
Potassium: 4.2 mmol/L (ref 3.5–5.1)
Sodium: 139 mmol/L (ref 136–145)
TOTAL PROTEIN: 6.9 g/dL (ref 6.4–8.3)

## 2017-08-13 LAB — CBC WITH DIFFERENTIAL/PLATELET
BASOS ABS: 0 10*3/uL (ref 0.0–0.1)
BASOS PCT: 1 %
EOS PCT: 3 %
Eosinophils Absolute: 0.1 10*3/uL (ref 0.0–0.5)
HCT: 36.2 % — ABNORMAL LOW (ref 38.4–49.9)
Hemoglobin: 11.7 g/dL — ABNORMAL LOW (ref 13.0–17.1)
Lymphocytes Relative: 22 %
Lymphs Abs: 0.9 10*3/uL (ref 0.9–3.3)
MCH: 29 pg (ref 27.2–33.4)
MCHC: 32.3 g/dL (ref 32.0–36.0)
MCV: 89.7 fL (ref 79.3–98.0)
MONO ABS: 0.4 10*3/uL (ref 0.1–0.9)
Monocytes Relative: 9 %
Neutro Abs: 2.6 10*3/uL (ref 1.5–6.5)
Neutrophils Relative %: 65 %
PLATELETS: 266 10*3/uL (ref 140–400)
RBC: 4.04 MIL/uL — ABNORMAL LOW (ref 4.20–5.82)
RDW: 13.7 % (ref 11.0–14.6)
WBC: 4 10*3/uL (ref 4.0–10.3)

## 2017-08-13 MED FILL — IMATINIB MESYLATE 400 MG TA: 400 | 30 days supply | Qty: 30 | Fill #1

## 2017-08-13 NOTE — Telephone Encounter (Signed)
Patient did not stop by scheduling to pick up contrast for CT per 4/1 los

## 2017-08-13 NOTE — Telephone Encounter (Signed)
Appointments scheduled Letter/Calendar printed per 4/1 los

## 2017-09-06 MED FILL — IMATINIB MESYLATE 400 MG TA: 400 | 30 days supply | Qty: 30 | Fill #2

## 2017-10-01 MED FILL — IMATINIB MESYLATE 400 MG TA: 400 | 30 days supply | Qty: 30 | Fill #3

## 2017-10-12 ENCOUNTER — Inpatient Hospital Stay: Payer: BLUE CROSS/BLUE SHIELD | Attending: Hematology

## 2017-10-12 ENCOUNTER — Telehealth: Payer: Self-pay | Admitting: Hematology

## 2017-10-12 DIAGNOSIS — C49A2 Gastrointestinal stromal tumor of stomach: Secondary | ICD-10-CM

## 2017-10-12 DIAGNOSIS — C49A0 Gastrointestinal stromal tumor, unspecified site: Secondary | ICD-10-CM | POA: Insufficient documentation

## 2017-10-12 LAB — COMPREHENSIVE METABOLIC PANEL
ALBUMIN: 4 g/dL (ref 3.5–5.0)
ALK PHOS: 72 U/L (ref 40–150)
ALT: 37 U/L (ref 0–55)
AST: 37 U/L — AB (ref 5–34)
Anion gap: 7 (ref 3–11)
BILIRUBIN TOTAL: 0.8 mg/dL (ref 0.2–1.2)
BUN: 22 mg/dL (ref 7–26)
CO2: 27 mmol/L (ref 22–29)
CREATININE: 2.36 mg/dL — AB (ref 0.70–1.30)
Calcium: 9.1 mg/dL (ref 8.4–10.4)
Chloride: 106 mmol/L (ref 98–109)
GFR calc Af Amer: 33 mL/min — ABNORMAL LOW (ref >60)
GFR, EST NON AFRICAN AMERICAN: 28 mL/min — AB (ref >60)
GLUCOSE: 104 mg/dL (ref 70–140)
Potassium: 4.2 mmol/L (ref 3.5–5.1)
Sodium: 140 mmol/L (ref 136–145)
TOTAL PROTEIN: 6.9 g/dL (ref 6.4–8.3)

## 2017-10-12 LAB — CBC WITH DIFFERENTIAL/PLATELET
BASOS ABS: 0 10*3/uL (ref 0.0–0.1)
Basophils Relative: 1 %
EOS PCT: 1 %
Eosinophils Absolute: 0 10*3/uL (ref 0.0–0.5)
HCT: 34.4 % — ABNORMAL LOW (ref 38.4–49.9)
Hemoglobin: 11.3 g/dL — ABNORMAL LOW (ref 13.0–17.1)
LYMPHS PCT: 18 %
Lymphs Abs: 0.7 10*3/uL — ABNORMAL LOW (ref 0.9–3.3)
MCH: 29.7 pg (ref 27.2–33.4)
MCHC: 32.9 g/dL (ref 32.0–36.0)
MCV: 90.2 fL (ref 79.3–98.0)
MONO ABS: 0.3 10*3/uL (ref 0.1–0.9)
Monocytes Relative: 7 %
Neutro Abs: 2.7 10*3/uL (ref 1.5–6.5)
Neutrophils Relative %: 73 %
PLATELETS: 238 10*3/uL (ref 140–400)
RBC: 3.82 MIL/uL — ABNORMAL LOW (ref 4.20–5.82)
RDW: 13.5 % (ref 11.0–14.6)
WBC: 3.7 10*3/uL — ABNORMAL LOW (ref 4.0–10.3)

## 2017-10-12 NOTE — Telephone Encounter (Signed)
Scheduled appt per 5/30 sch msg - left vm for pt re appts.

## 2017-10-14 ENCOUNTER — Other Ambulatory Visit: Payer: Self-pay | Admitting: Hematology

## 2017-10-14 DIAGNOSIS — C49A0 Gastrointestinal stromal tumor, unspecified site: Secondary | ICD-10-CM

## 2017-10-15 ENCOUNTER — Encounter (HOSPITAL_COMMUNITY): Payer: Self-pay

## 2017-10-15 ENCOUNTER — Ambulatory Visit (HOSPITAL_COMMUNITY)
Admission: RE | Admit: 2017-10-15 | Discharge: 2017-10-15 | Disposition: A | Discharge status: Home / Self Care | Payer: BLUE CROSS/BLUE SHIELD | Source: Ambulatory Visit | Attending: Hematology | Admitting: Hematology

## 2017-10-15 ENCOUNTER — Telehealth: Payer: Self-pay

## 2017-10-15 DIAGNOSIS — C49A0 Gastrointestinal stromal tumor, unspecified site: Secondary | ICD-10-CM | POA: Diagnosis present

## 2017-10-15 DIAGNOSIS — Z9889 Other specified postprocedural states: Secondary | ICD-10-CM | POA: Diagnosis not present

## 2017-10-15 NOTE — Telephone Encounter (Signed)
-----   Message from Truitt Merle, MD sent at 10/14/2017 12:45 PM EDT ----- Please call radiology that I have changed his CT from w contrast to wo contrast due to his renal insufficiency, his CT is scheduled for Monday 6/3 @ 10am, thanks   [employer]

## 2017-10-15 NOTE — Telephone Encounter (Signed)
Called [facility] Central Scheduling to change order for CT scan per Dr. Burr Medico.

## 2017-10-16 ENCOUNTER — Telehealth: Payer: Self-pay

## 2017-10-16 NOTE — Telephone Encounter (Signed)
Called patient per Dr. Burr Medico left voice message CT results showed no evidence of recurrence of disease, no concerns.  Encourage if he has questions to call his back.

## 2017-10-16 NOTE — Telephone Encounter (Signed)
-----   Message from Truitt Merle, MD sent at 10/16/2017 10:06 AM EDT ----- Please let pt know the CT scan findings, NED, no concerns, thanks   Truitt Merle  10/16/2017

## 2017-10-23 ENCOUNTER — Other Ambulatory Visit: Payer: Self-pay | Admitting: Hematology

## 2017-10-23 DIAGNOSIS — C49A2 Gastrointestinal stromal tumor of stomach: Secondary | ICD-10-CM

## 2017-10-24 ENCOUNTER — Other Ambulatory Visit: Payer: Self-pay | Admitting: Internal Medicine

## 2017-10-25 MED FILL — IMATINIB MESYLATE 400 MG TA: 400 | 30 days supply | Qty: 30 | Fill #0

## 2017-11-12 ENCOUNTER — Inpatient Hospital Stay: Payer: BLUE CROSS/BLUE SHIELD | Admitting: Hematology

## 2017-11-12 ENCOUNTER — Inpatient Hospital Stay: Payer: BLUE CROSS/BLUE SHIELD | Attending: Hematology

## 2017-11-16 ENCOUNTER — Telehealth: Payer: Self-pay | Admitting: Hematology

## 2017-11-16 NOTE — Telephone Encounter (Signed)
Left VM for patient regarding appointments being rescheduled per 7/1 sch msg

## 2017-11-19 MED FILL — IMATINIB MESYLATE 400 MG TA: 400 | 30 days supply | Qty: 30 | Fill #1

## 2017-11-21 ENCOUNTER — Telehealth: Payer: Self-pay

## 2017-11-21 NOTE — Telephone Encounter (Signed)
Oral Oncology Patient Advocate Encounter  Received notification from [facility] that the existing prior authorization for [place] is due for renewal.  Renewal PA submitted by fax to [id number].   Status is pending  Oral Oncology Clinic will continue to follow.  [place] [facility] [place] Phone [phone] Fax [phone]

## 2017-11-22 NOTE — Telephone Encounter (Signed)
Oral Oncology Pharmacist Encounter  Received notification from [employer] that re-authorization request for Larry Clements (imatinib) has been approved PA#: [id number] Effective dates: 11/21/2017 through 11/22/2018  [name], PharmD, BCPS, BCOP  11/22/2017 11:18 AM Oral Oncology Clinic [phone]

## 2017-11-23 ENCOUNTER — Encounter (HOSPITAL_COMMUNITY): Payer: BLUE CROSS/BLUE SHIELD

## 2017-11-24 IMAGING — MR MR ABDOMEN WO/W CM
11 of 17 series · 25 of 48 positions shown · IV contrast (10ml Multihance)
Comparison: 07/31/2016

CLINICAL DATA: Left upper quadrant mass seen on ultrasound, for
further investigation. Renal insufficiency.

EXAM:
MRI ABDOMEN WITHOUT AND WITH CONTRAST
TECHNIQUE: Multiplanar multisequence MR imaging of the abdomen was performed
both before and after the administration of intravenous contrast.
CONTRAST:  10mL MULTIHANCE GADOBENATE DIMEGLUMINE 529 MG/ML IV SOLN
Half dose was given due to renal insufficiency.
Creatinine was obtained on site at [HOSPITAL] at [HOSPITAL].
Results: Creatinine 2.0 mg/dL.

[Series 4: T2 · coronal · 5.0mm · 1.56mm/px · 1 of 38 slices shown (1 of 3)]
[im 1/38]
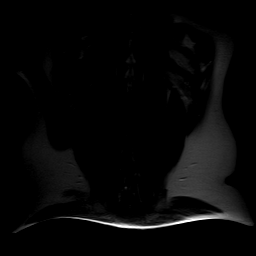

[Series 5: T2 · axial · 5.0mm · 1.56mm/px · 1 of 33 slices shown (2 of 3)]
[im 1/33]
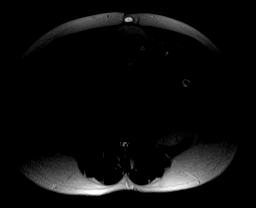

[Series 7: [id number]_500_[id number] · axial · 6.0mm · 2.08mm/px · z∈[-124,+103]mm · 2 of 90 slices shown]
[im 1/90]
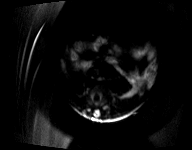
[im 90/90]
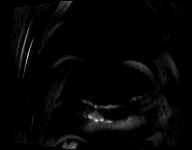

[Series 8: [id number]_adc · axial · 6.0mm · 2.08mm/px · 1 of 30 slices shown]
[im 1/30]
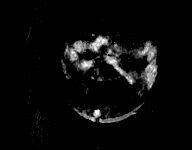

[Series 9: T2 · axial · 6.5mm · 0.74mm/px · 1 of 31 slices shown (3 of 3)]
[im 1/31]
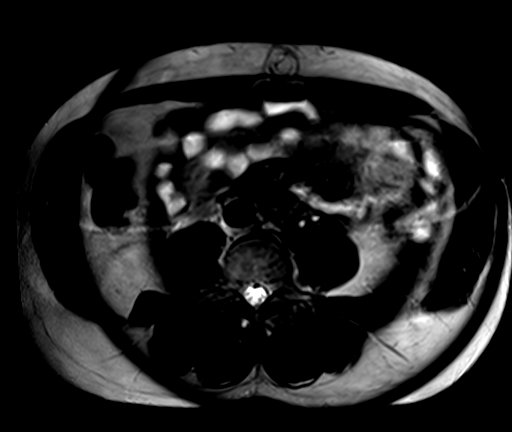

[Series 10: axial in out · axial · 5.5mm · 0.78mm/px · 1 of 60 slices shown]
[im 1/60]
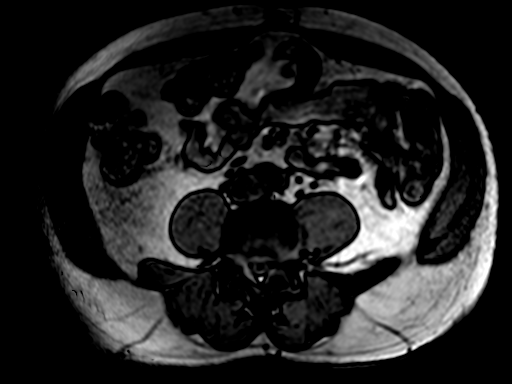

[Series 11: cor in out · coronal · 5.5mm · 0.74mm/px · 2 of 62 slices shown]
[im 1/62]
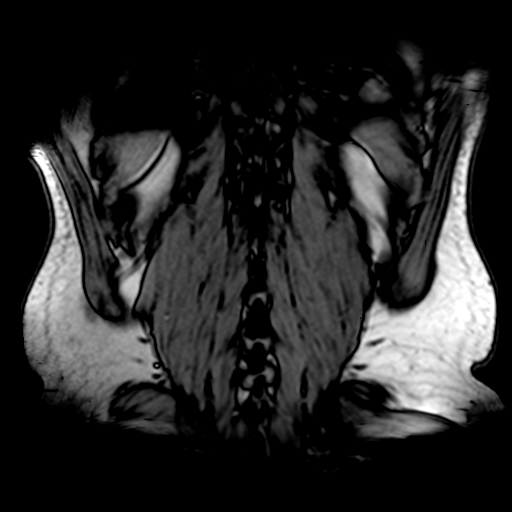
[im 62/62]
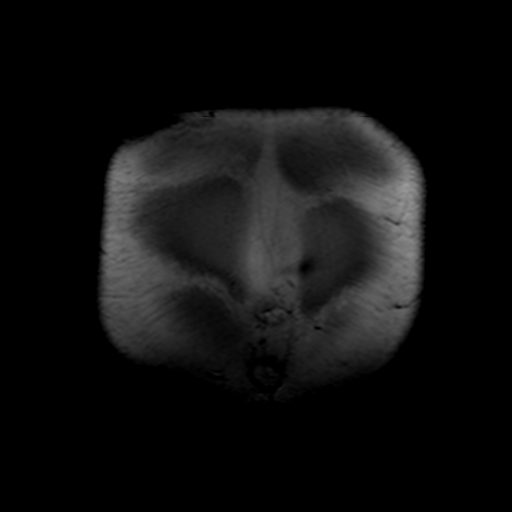

[Series 12: T1 dynamic · axial · non-contrast · 2.0mm · 0.78mm/px · z∈[-132,+74]mm · 4 of 104 slices shown]
[im 1/104]
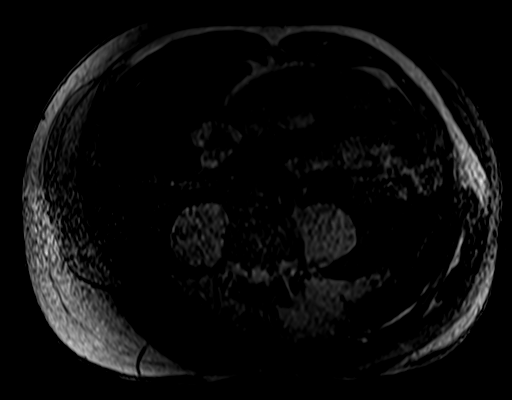
[im 35/104]
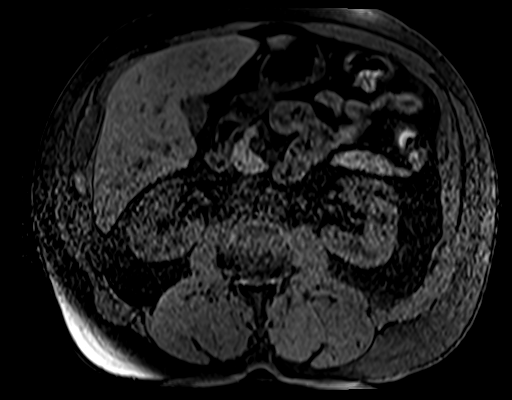
[im 69/104]
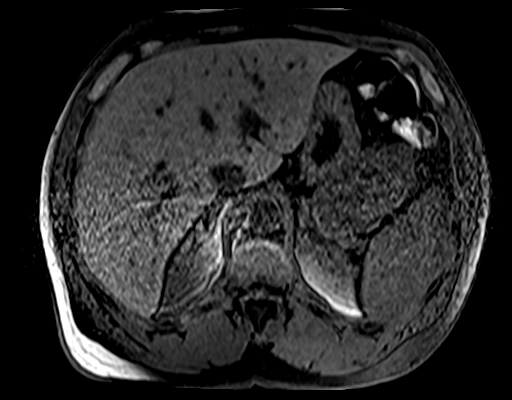
[im 104/104]
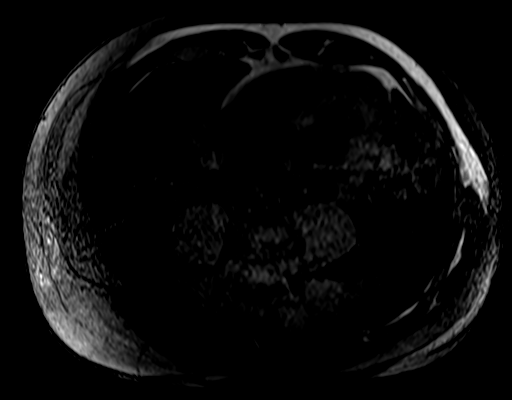

[Series 13: T1 dynamic fat-sat post-contrast · axial · 2.0mm · 0.78mm/px · z∈[-132,+74]mm · 4 of 104 slices shown (1 of 3)]
[im 1/104]
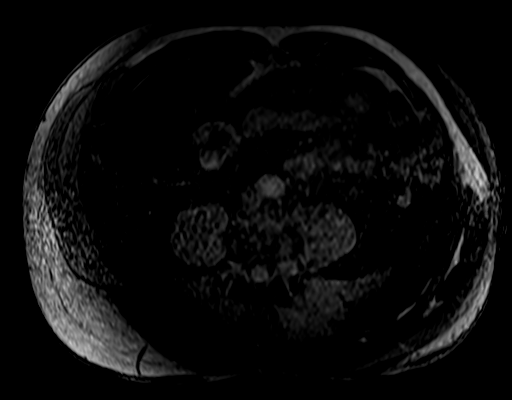
[im 35/104]
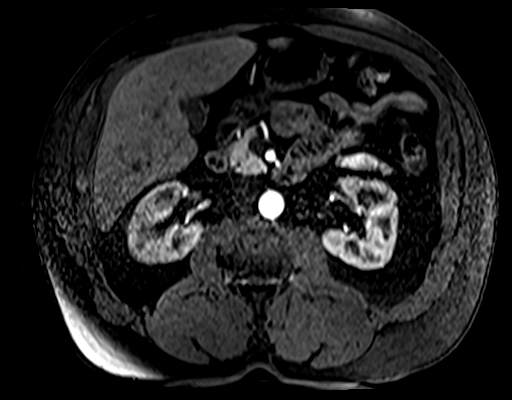
[im 69/104]
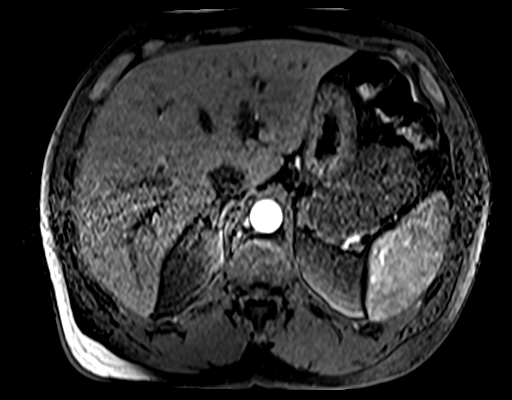
[im 104/104]
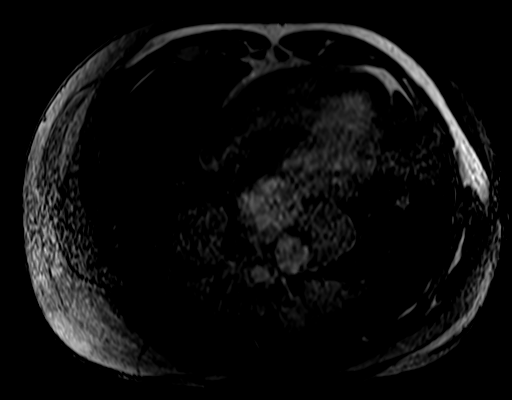

[Series 14: T1 dynamic fat-sat post-contrast · axial · 2.0mm · 0.78mm/px · z∈[-132,+74]mm · 4 of 104 slices shown (2 of 3)]
[im 1/104]
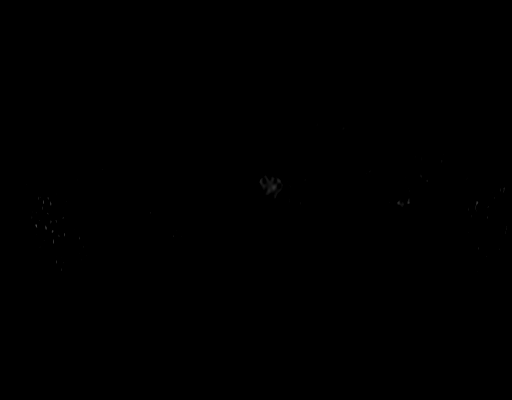
[im 35/104]
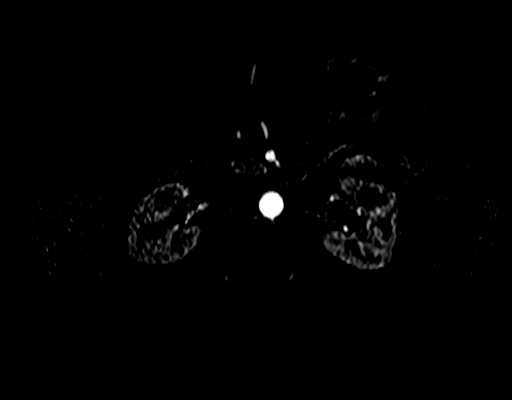
[im 69/104]
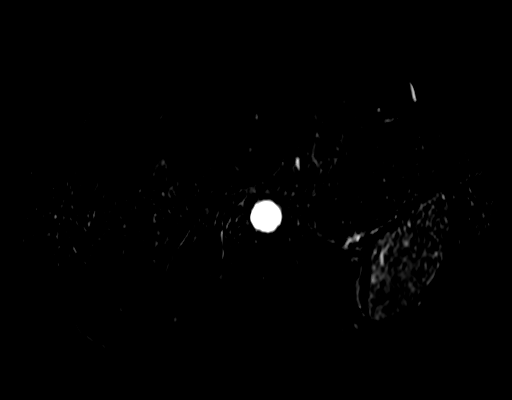
[im 104/104]
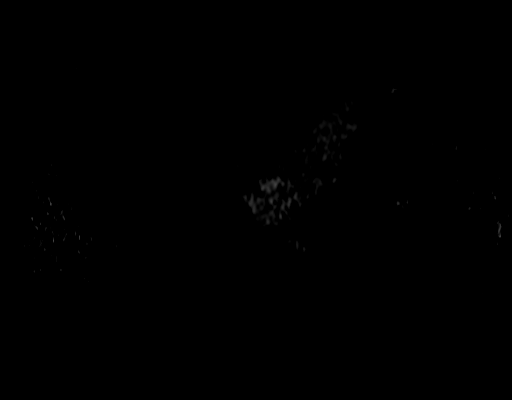

[Series 15: T1 dynamic fat-sat post-contrast · axial · 2.0mm · 0.78mm/px · z∈[-132,+74]mm · 4 of 104 slices shown (3 of 3)]
[im 1/104]
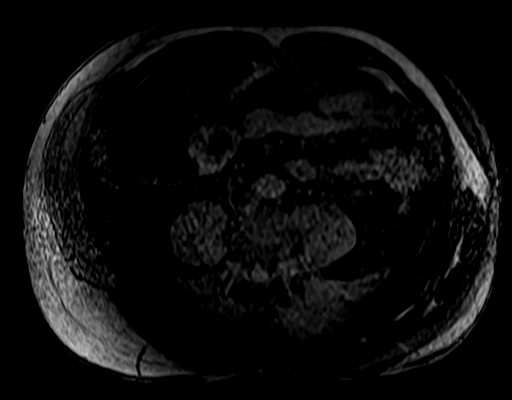
[im 35/104]
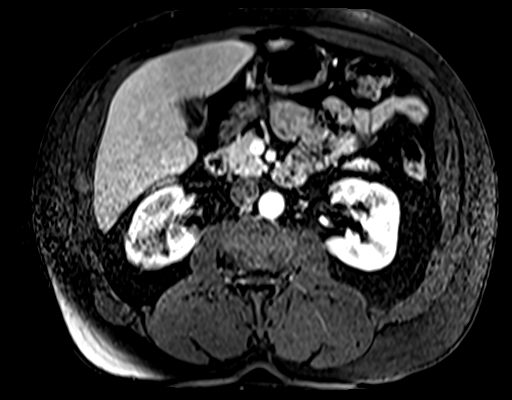
[im 69/104]
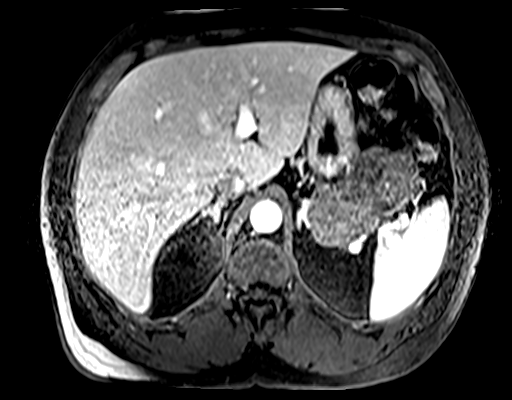
[im 104/104]
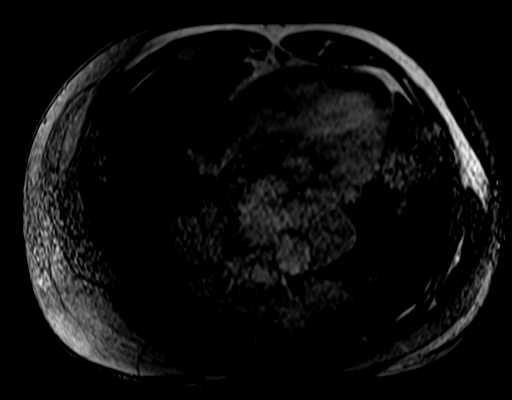

[25 of 48 positions shown; findings below may reference images not displayed]

FINDINGS: Despite efforts by the technologist and patient, motion artifact is
present on today's exam and could not be eliminated. This reduces
exam sensitivity and specificity.

Lower chest: Unremarkable

Hepatobiliary: Unremarkable

Pancreas: The left upper quadrant mass appears to displace the
pancreatic tail inferiorly for example on image [DATE]. I am skeptical
that this mass is truly arising from the pancreas although clearly
there closely associated. No pancreatic duct dilatation. The
pancreas appears otherwise normal.

Spleen: The left upper quadrant mass is adjacent to the splenic
hilum but does not appear to be arising from the spleen. Spleen
normal.

Adrenals/Urinary Tract: The left upper quadrant massive immediately
adjacent to the lateral limb left adrenal gland as on image 37/15
but is not arising from the adrenal gland.

Multiple fluid signal intensity lesions of both kidneys are present,
likely representing simple cysts. None of these appear to
demonstrate high T1 signal characteristics precontrast. There is a
cluster of 3 small cystic lesions in the right mid to lower kidney
peripherally on image 20 of series 9 which I categorized as Bosniak
category 2F due to questionable marginal enhancement for example
images 20 through 27 of series 19. Some of these lesions are
technically too small to characterize based on the degree of motion
artifact.

Stomach/Bowel: The left upper quadrant mass abuts the stomach
fundus.

Vascular/Lymphatic: A peripancreatic node measures 1.6 cm in short
axis on image 51/20, sitting just above [place] pancreatic body.
Otherwise unremarkable.

Other: In the left upper quadrant interposed between the stomach and
spleen there is a 10.3 by 5.8 by 8.0 cm (volume = 250 cm^3) mass
with mildly heterogeneous but low T1 signal characteristics and
intermediate to high T2 signal characteristics with internal
reticulonodular architecture of even higher T2 signal
characteristics. There is diffuse if somewhat low-level enhancement
in this mass.

Musculoskeletal: Degenerative disc disease and spondylosis at L5-S1.
Small umbilical hernia contains adipose tissue.
IMPRESSION: 1. A 10.3 cm mass in the left upper quadrant adjacent to the gastric
fundus interposed between the stomach and spleen, and tracking along
the upper margin of the pancreatic tail has low T1 and high T2
signal characteristics with diffuse enhancement. Based on the slight
heterogeneity I favor a GI stromal tumor. Differential diagnostic
considerations may include lymphoma, gastrointestinal carcinoid or
schwannoma, or possibly a or air gastrointestinal leiomyosarcoma. I
am skeptical of an adrenal origin, the mass abuts but does not
appear to be arising from the adrenal gland lateral limb. Surgical
referral recommended.
2. Bosniak category 2F lesion of the right kidney lateral mid to
lower pole. This cystic lesion is probably benign but warrants
surveillance. Renal protocol MRI with and without contrast
recommended in 6 months time to reassess. This recommendation
follows ACR consensus guidelines: Management of the Incidental Renal
Mass on CT: A White Paper of the ACR Incidental Findings Committee.
[HOSPITAL] 1455;[DATE].
3. Small umbilical hernia contains adipose tissue.
4. Spondylosis and degenerative disc disease at L5-S1.

## 2017-12-13 MED FILL — IMATINIB MESYLATE 400 MG TA: 400 | 30 days supply | Qty: 30 | Fill #2

## 2017-12-19 ENCOUNTER — Other Ambulatory Visit: Payer: Self-pay | Admitting: Internal Medicine

## 2017-12-19 DIAGNOSIS — R972 Elevated prostate specific antigen [PSA]: Secondary | ICD-10-CM

## 2017-12-19 DIAGNOSIS — E211 Secondary hyperparathyroidism, not elsewhere classified: Secondary | ICD-10-CM

## 2017-12-19 DIAGNOSIS — N400 Enlarged prostate without lower urinary tract symptoms: Secondary | ICD-10-CM

## 2017-12-27 ENCOUNTER — Ambulatory Visit (INDEPENDENT_AMBULATORY_CARE_PROVIDER_SITE_OTHER): Payer: BLUE CROSS/BLUE SHIELD | Admitting: Internal Medicine

## 2017-12-27 ENCOUNTER — Encounter: Payer: Self-pay | Admitting: Internal Medicine

## 2017-12-27 VITALS — BP 130/80 | HR 80 | Ht 76.0 in | Wt 239.0 lb

## 2017-12-27 DIAGNOSIS — N183 Chronic kidney disease, stage 3 unspecified: Secondary | ICD-10-CM

## 2017-12-27 DIAGNOSIS — I129 Hypertensive chronic kidney disease with stage 1 through stage 4 chronic kidney disease, or unspecified chronic kidney disease: Secondary | ICD-10-CM

## 2017-12-27 DIAGNOSIS — E211 Secondary hyperparathyroidism, not elsewhere classified: Secondary | ICD-10-CM | POA: Diagnosis not present

## 2017-12-27 DIAGNOSIS — R972 Elevated prostate specific antigen [PSA]: Secondary | ICD-10-CM

## 2017-12-27 DIAGNOSIS — Z8509 Personal history of malignant neoplasm of other digestive organs: Secondary | ICD-10-CM

## 2017-12-27 LAB — TSH: TSH: 1.69 m[IU]/L (ref 0.40–4.50)

## 2017-12-27 LAB — PSA: PSA: 6.1 ng/mL — ABNORMAL HIGH (ref <=4.0)

## 2017-12-27 NOTE — Progress Notes (Signed)
   Subjective:    Patient ID: Larry Clements, male    DOB: 1955/11/10, 62 y.o.   MRN: [id number]  HPI 62 year old Male for follow up of HTN, elevated TSH and elevated PSA. In Feb, TSH was 4.36. No prior Hx hypothyroidism. PSA then was 4.8 and had been 3.21 Apr 2016.PSA was 3.48 June 2016 and 2.48 November 2014.PSA was 2.13 April 2012. Family hx chronic kidney disease with dialysis in brother who had FSG per Dr. Lorrene Reid and is deceased. Father had prostate cancer.   History of GIST tumor s/p partial gastrectomy June 2018 by Dr. Barry Dienes treated by Oncologist, Dr. Burr Medico ,with Gleevac started August 2018 with anticipated 3 year course. Hx CKD s/p biopsy showing Stage II-III membranous glomerulonephropathy followed by Dr. Lorrene Reid.  PSA is now 6.1. No urinary symptoms Is being referred to Urology for evaluation    Review of Systems     Objective:   Physical Exam  See above Rectal exam not done today      Assessment & Plan:  Hx CKD managed by Neprology Elevated PSA with family hx in father- refer to Urology HTn Hx GIST tumor- treated by Oncology  Plan: Urology appt.

## 2017-12-28 NOTE — Patient Instructions (Signed)
Refer to Urology.

## 2018-01-03 ENCOUNTER — Other Ambulatory Visit: Payer: Self-pay | Admitting: Internal Medicine

## 2018-01-03 NOTE — Progress Notes (Signed)
[place]  [phone] Fax:(336) [phone]  Clinic Follow Up Note   Patient Care Team: Elby Showers, MD as PCP - General (Internal Medicine)   Date of Service:  01/04/2018  CHIEF COMPLAINTS:  Follow up Gastrointestinal stromal tumor of the stomach  Oncology History   Cancer Staging 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[place]) Staging form: Gastrointestinal Stromal Tumor - Gastric and Omental GIST, AJCC 8th Edition - Pathologic stage from 10/26/2016: Stage IIIB (pT4, pN0, cM0, Mitotic Rate: High) - Signed by Truitt Merle, MD on 01/20/2017       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[place])   08/18/2016 Imaging    Abdominal MRI with and without contrast showed a 10.3 cm mass in the left upper quadrant at adjacent to the gastric fundus    10/26/2016 Initial Diagnosis    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[place])    10/26/2016 Surgery    laparoscopic partial gastrectomy by Dr. 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 male recently diagnosed with a gastrointestinal stromal tumor of the stomach. Renal ultrasound on 07/31/2016 showed an incidental finding of an approximate 9 cm mass within the left upper abdominal quadrant. Follow-up MRI of the abdomen 08/17/2016 showed a 10.3 cm mass in the left upper quadrant adjacent to the gastric fundus interposed between the stomach and the spleen and tracking along the upper portion of the pancreatic tail. Endoscopic ultrasound by Dr. Ardis Hughs 09/21/2016 showed a large hypoechoic heterogeneous mass directly  abutting the gastric wall. The mass appeared to involve the muscularis propria layer of the gastric wall and measured at least 8.5 cm. Fine-needle aspiration of the mass showed gastrointestinal stromal tumor. On 10/26/2016 he underwent a laparoscopic partial gastrectomy by Dr. Barry Dienes. The mass was coming off the greater curve just posterior to the short gastric arteries. Final pathology showed a 10.5 cm gastrointestinal stromal tumor, spindle cell type; margins uninvolved by tumor. Mitotic rate 13/50 high-power field; grade 2.  CURRENT THERAPY: Adjuvant imatinib (Gleevec) 400 mg daily for 3 years, started on 12/27/2016  INTERVAL HISTORY:  Larry Clements is here for a follow up of his GIST. He presents to the clinic today by himself. He notes he is doing well. He notes no issues with Gleevec. He notes he is drinking plenty of water. He notes he is busy with [employer] 6 days a week. He notes his surgical scar has healed but has some scar tissue which was offered to be removed by surgeon it he wanted. The patient is not concerned.     MEDICAL HISTORY:  Past Medical History:  Diagnosis Date  . Chronic kidney disease, stage III (moderate) (HCC)   . Gastrointestinal stromal tumor (GIST) ([place])   . Hypertension   . 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 PLA2R staining +. 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 COLONOSCOPY    . EUS N/A 09/21/2016   Procedure:  UPPER ENDOSCOPIC ULTRASOUND (EUS) LINEAR;  Surgeon: Milus Banister, MD;  Location: [facility];  Service: Endoscopy;  Laterality: N/A;  . LAPAROSCOPIC GASTRECTOMY N/A 10/26/2016   Procedure: LAPAROSCOPIC PARTIAL GASTRECTOMY;  Surgeon: Stark Klein, MD;  Location: [facility];  Service: General;  Laterality: N/A;  . LAPAROSCOPIC PARTIAL GASTRECTOMY  10/26/2016  . SURGICAL EXCISION OF  EXCESSIVE SKIN     fatty tissue    SOCIAL HISTORY: He lives in [place]. He is divorced. He has 1 son and 1 daughter. Both live locally. He is employed by [employer]. No tobacco or alcohol use.  FAMILY HISTORY: Family History  Problem Relation Age of Onset  . Diabetes Father   . Kidney failure Father   . Colon cancer Father   . Hypertension Mother   He reports his mother had Alzheimer's. Father may have had prostate cancer.   ALLERGIES:  No known allergies.  MEDICATIONS:  Current Outpatient Medications  Medication Sig Dispense Refill  . amLODipine (NORVASC) 10 MG tablet TAKE 1 TABLET BY MOUTH EVERY DAY 90 tablet 3  . furosemide (LASIX) 40 MG tablet TAKE 1 TABLET (40 MG TOTAL) BY MOUTH DAILY. 90 tablet 0  . hydrALAZINE (APRESOLINE) 50 MG tablet Take 50 mg by mouth 3 (three) times daily.    Marland Kitchen imatinib (GLEEVEC) 400 MG tablet TAKE 1 TABLET (400 MG TOTAL) BY MOUTH DAILY. TAKE WITH MEALS AND LARGE GLASS OF WATER.CAUTION:CHEMOTHERAPY. 30 tablet 3  . KLOR-CON M20 20 MEQ tablet TAKE 2 TABLETS BY MOUTH EVERY DAY 180 tablet 0  . labetalol (NORMODYNE) 300 MG tablet Take 300 mg by mouth every 8 (eight) hours.  5  . losartan (COZAAR) 100 MG tablet TAKE 1 TABLET BY MOUTH EVERY DAY 90 tablet 1   No current facility-administered medications for this visit.     REVIEW OF SYSTEMS:   Constitutional: Denies fevers, chills or abnormal night sweats (+) good energy and appetite Eyes: Denies blurriness of vision, double vision or watery eyes Ears, nose, mouth, throat, and face: Denies mucositis or sore throat Respiratory: Denies cough, dyspnea or wheezes Cardiovascular: Denies palpitation, chest discomfort  Gastrointestinal:  Denies nausea, heartburn or change in bowel habits Skin: Denies abnormal skin rashes Lymphatics: Denies new lymphadenopathy or easy bruising Neurological:Denies numbness, tingling or new weaknesses Behavioral/Psych: Mood is stable, no new changes  All other systems were reviewed  with the patient and are negative.  PHYSICAL EXAMINATION: ECOG PERFORMANCE STATUS: 0  Vitals:   01/04/18 1521  BP: (!) 145/80  Pulse: 73  Resp: 17  Temp: 98.4 F (36.9 C)  SpO2: 99%   Filed Weights   01/04/18 1521  Weight: 236 lb 14.4 oz (107.5 kg)    GENERAL:alert, no distress and comfortable SKIN: skin color, texture, turgor are normal, no rashes or significant lesions EYES: normal, conjunctiva are pink and non-injected, sclera clear OROPHARYNX:no exudate, no erythema and lips, buccal mucosa, and tongue normal  NECK: supple, thyroid normal size, non-tender, without nodularity LYMPH:  no palpable lymphadenopathy in the cervical, axillary or inguinal regions LUNGS: clear to auscultation and percussion with normal breathing effort HEART: regular rate & rhythm and no murmurs   ABDOMEN: Abdomen soft, non-tender and normal bowel sounds. (+) Midline epigastric surgical incision healed well.  Musculoskeletal:no cyanosis of digits and no clubbing  PSYCH: alert & oriented x 3 with fluent speech NEURO: no focal motor/sensory deficits  LABORATORY DATA:  I have reviewed the data as listed CBC Latest Ref Rng & Units 01/04/2018 10/12/2017 08/13/2017  WBC 4.0 -  10.3 K/uL 4.3 3.7(L) 4.0  Hemoglobin 13.0 - 17.1 g/dL 10.6(L) 11.3(L) 11.7(L)  Hematocrit 38.4 - 49.9 % 33.1(L) 34.4(L) 36.2(L)  Platelets 140 - 400 K/uL 219 238 266   PATHOLOGY   Laparoscopic partial gastrectomy 10/26/2016  Diagnosis Stomach, resection for tumor, Greater curve - GASTROINTESTINAL STROMAL TUMOR, SPINDLE CELL TYPE, 10.5 CM - MARGINS UNINVOLVED BY TUMOR - SEE ONCOLOGY TABLE AND COMMENT BELOW Microscopic Comment GASTROINTESTINAL STROMAL TUMOR (GIST): Procedure: Resection, partial gastrectomy Tumor Site: Not specified Tumor Size: 10.5 cm Tumor Focality: Unifocal Histologic Type: Gastrointestinal stromal tumor, spindle cell type Mitotic Rate: 13 /50 HIGH POWER FIELD Necrosis: Not identified Histologic Grade:  G2 (High grade; mitotic rate >5/50 HIGH POWER FIELD) Risk Assessment: High Margins: Uninvolved by GIST (See comment) Ancillary testing: See [id number] Immunohistochemistry: CD117: positive Other: CD34 and vimentin positive Lymph nodes: No lymph nodes submitted or found Pathologic Staging: pT4 pN0 Comment: The edge of the GIST is grossly present in the stapled surgical margin; however, there is no evidence of GIST in the histologic sections of the margin. Therefore, the actual distance of the lesion from the stapled margin is not known.    RADIOLOGY:   CT AP WO Contrast 10/15/17 IMPRESSION: Postsurgical changes along the posterior gastric cardia with resection of the prior soft tissue mass (GIST) in the left upper abdomen. No evidence of recurrent or metastatic disease.  MR Abdomen W WO Contrast, 08/18/2016 IMPRESSION: 1. A 10.3 cm mass in the left upper quadrant adjacent to the gastric fundus interposed between the stomach and spleen, and tracking along the upper margin of the pancreatic tail has low T1 and high T2 signal characteristics with diffuse enhancement. Based on the slight heterogeneity I favor a GI stromal tumor. Differential diagnostic considerations may include lymphoma, gastrointestinal carcinoid or schwannoma, or possibly a or air gastrointestinal leiomyosarcoma. I am skeptical of an adrenal origin, the mass abuts but does not appear to be arising from the adrenal gland lateral limb. Surgical referral recommended. 2. Bosniak category 59F lesion of the right kidney lateral mid to lower pole. This cystic lesion is probably benign but warrants surveillance. Renal protocol MRI with and without contrast recommended in 6 months time to reassess. This recommendation follows ACR consensus guidelines: Management of the Incidental Renal Mass on CT: A White Paper of the ACR Incidental Findings Committee. J Am Coll Radiol 5027;74:128-786. 3. Small umbilical hernia contains  adipose tissue. 4. 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  US Renal and US Renal Artery Stenosis, 07/31/2016 IMPRESSION: 1. No evidence of renal atrophy, urinary obstruction or evidence of renal artery stenosis. 2. **An incidental finding of potential clinical significance has been found. Approximately 9 cm mass within the left upper abdominal quadrant - while indeterminate on this examination, this structure appears separate from the spleen and kidney and thus may be arising from the adrenal gland. Further evaluation with abdominal MRI is recommended.** 3. Bilateral renal cysts. 4. Suspected punctate (approximately 4 mm) nonobstructing left-sided renal stone. 5. Enlarged prostate with mass effect upon the undersurface of the urinary bladder.  ASSESSMENT & PLAN: 62 y.o. African American male with a gastrointestinal stromal tumor of the stomach. He underwent a partial gastrectomy 10/26/2016.   1. Gastrointestinal stromal tumor of the stomach, pT4N0M0, stage IIIB, with high mitotic rate 13/50, grade 2. -I have previously reviewed his surgical pathology finding with patient and his daughter. Per NCCN counseling, giving the size of the tumor, and high mitotic rate, he is at very high risk for recurrence (  86%), and standard care is adjuvant imatinib (Gleevec) 400 mg daily for 3 years, no need dose adjustments based on his current renal function. The benefit and potential side effects were discussed with patient in details. However he was reluctant to take imatinib, due to the incidental finding of the tumor.  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 -He has been tolerating Gleevec well so far with no issues. He has missed a few appointments. I previously advised him he needs to see [place] regularly while on medication to monitor his labs and  continue surveillance. He understands.  -I reviewed his surveillance CT AP from 10/15/17 which showed no evidence of recurrence or metastatic disease.  -Labs reviewed, CBC WNL except hg at 10.6 and Hct at 33.1 which is lower than before. CMP is still pending. His PSA was recently elevated at 6.1. His PCP has referred him to urology.  -I discussed his anemia can be related to [facility] and is CKD. I encouraged him to continue to drink plenty of water.  -He continues to tolerate Gleevec well, will continue for another 2 years. -follow up in 4 months   2. Chronic kidney disease, stage III -will continue f/u his nephrologist -He underwent biopsy in January 2019   3. Hypertension  -Patient on Norvasc and losartan currently. His blood pressure noted to be 159/99 in the office on 01/18/17.  -I previously advised the patient to follow up with his PCP regarding his blood pressure -BP at 145/80 today (01/04/18). I discussed an uncontrolled BP can effect his CKD and I encouraged him to closely monitor and better manage his HTN.   4.  Bilateral kidney cysts -Found on imaging study, stable   Plan:  -Continue imatinib, refilled today   -Lab and f/u in 4 months   I, Joslyn Devon, am acting as scribe for Truitt Merle, MD.   I have reviewed the above documentation for accuracy and completeness, and I agree with the above.     Truitt Merle, MD 01/04/2018

## 2018-01-04 ENCOUNTER — Telehealth: Payer: Self-pay

## 2018-01-04 ENCOUNTER — Inpatient Hospital Stay (HOSPITAL_BASED_OUTPATIENT_CLINIC_OR_DEPARTMENT_OTHER): Payer: BLUE CROSS/BLUE SHIELD | Admitting: Hematology

## 2018-01-04 ENCOUNTER — Other Ambulatory Visit: Payer: Self-pay | Admitting: Hematology

## 2018-01-04 ENCOUNTER — Inpatient Hospital Stay: Payer: BLUE CROSS/BLUE SHIELD | Attending: Hematology

## 2018-01-04 VITALS — BP 145/80 | HR 73 | Temp 98.4°F | Resp 17 | Ht 76.0 in | Wt 236.9 lb

## 2018-01-04 DIAGNOSIS — C49A2 Gastrointestinal stromal tumor of stomach: Secondary | ICD-10-CM

## 2018-01-04 DIAGNOSIS — K429 Umbilical hernia without obstruction or gangrene: Secondary | ICD-10-CM | POA: Diagnosis not present

## 2018-01-04 DIAGNOSIS — Z800 Family history of malignant neoplasm of digestive organs: Secondary | ICD-10-CM | POA: Diagnosis not present

## 2018-01-04 DIAGNOSIS — M5137 Other intervertebral disc degeneration, lumbosacral region: Secondary | ICD-10-CM | POA: Insufficient documentation

## 2018-01-04 DIAGNOSIS — Z85028 Personal history of other malignant neoplasm of stomach: Secondary | ICD-10-CM | POA: Diagnosis not present

## 2018-01-04 DIAGNOSIS — Z9221 Personal history of antineoplastic chemotherapy: Secondary | ICD-10-CM | POA: Diagnosis not present

## 2018-01-04 DIAGNOSIS — Z79899 Other long term (current) drug therapy: Secondary | ICD-10-CM

## 2018-01-04 DIAGNOSIS — I129 Hypertensive chronic kidney disease with stage 1 through stage 4 chronic kidney disease, or unspecified chronic kidney disease: Secondary | ICD-10-CM | POA: Insufficient documentation

## 2018-01-04 DIAGNOSIS — N022 Recurrent and persistent hematuria with diffuse membranous glomerulonephritis: Secondary | ICD-10-CM

## 2018-01-04 DIAGNOSIS — N281 Cyst of kidney, acquired: Secondary | ICD-10-CM

## 2018-01-04 DIAGNOSIS — C49A0 Gastrointestinal stromal tumor, unspecified site: Secondary | ICD-10-CM

## 2018-01-04 DIAGNOSIS — N400 Enlarged prostate without lower urinary tract symptoms: Secondary | ICD-10-CM

## 2018-01-04 DIAGNOSIS — N183 Chronic kidney disease, stage 3 (moderate): Secondary | ICD-10-CM | POA: Insufficient documentation

## 2018-01-04 LAB — CBC WITH DIFFERENTIAL (CANCER CENTER ONLY)
BASOS ABS: 0 10*3/uL (ref 0.0–0.1)
BASOS PCT: 1 %
EOS ABS: 0.1 10*3/uL (ref 0.0–0.5)
Eosinophils Relative: 2 %
HCT: 33.1 % — ABNORMAL LOW (ref 38.4–49.9)
HEMOGLOBIN: 10.6 g/dL — AB (ref 13.0–17.1)
Lymphocytes Relative: 24 %
Lymphs Abs: 1.1 10*3/uL (ref 0.9–3.3)
MCH: 29.7 pg (ref 27.2–33.4)
MCHC: 32 g/dL (ref 32.0–36.0)
MCV: 92.7 fL (ref 79.3–98.0)
MONOS PCT: 9 %
Monocytes Absolute: 0.4 10*3/uL (ref 0.1–0.9)
NEUTROS PCT: 64 %
Neutro Abs: 2.8 10*3/uL (ref 1.5–6.5)
Platelet Count: 219 10*3/uL (ref 140–400)
RBC: 3.57 MIL/uL — AB (ref 4.20–5.82)
RDW: 12.9 % (ref 11.0–14.6)
WBC: 4.3 10*3/uL (ref 4.0–10.3)

## 2018-01-04 LAB — CMP (CANCER CENTER ONLY)
ALBUMIN: 4.2 g/dL (ref 3.5–5.0)
ALK PHOS: 95 U/L (ref 38–126)
ALT: 43 U/L (ref 0–44)
ANION GAP: 10 (ref 5–15)
AST: 46 U/L — ABNORMAL HIGH (ref 15–41)
BUN: 27 mg/dL — ABNORMAL HIGH (ref 6–20)
CALCIUM: 9.3 mg/dL (ref 8.9–10.3)
CO2: 27 mmol/L (ref 22–32)
Chloride: 107 mmol/L (ref 98–111)
Creatinine: 2.46 mg/dL — ABNORMAL HIGH (ref 0.61–1.24)
GFR, Est AFR Am: 31 mL/min — ABNORMAL LOW (ref >60)
GFR, Estimated: 27 mL/min — ABNORMAL LOW (ref >60)
GLUCOSE: 76 mg/dL (ref 70–99)
Potassium: 3.9 mmol/L (ref 3.5–5.1)
SODIUM: 144 mmol/L (ref 135–145)
Total Bilirubin: 0.5 mg/dL (ref 0.3–1.2)
Total Protein: 7.3 g/dL (ref 6.5–8.1)

## 2018-01-04 NOTE — Telephone Encounter (Signed)
Printed avs and calender of upcoming appointment. Patient stated that his f/u appointment needed to be the 1st week of Jan., however Burr Medico is on vacation that week and had to schedule him for the following Friday, the 2nd week of Jan. Per 8/23 los

## 2018-01-05 ENCOUNTER — Encounter: Payer: Self-pay | Admitting: Hematology

## 2018-01-08 MED FILL — IMATINIB MESYLATE 400 MG TA: 400 | 30 days supply | Qty: 30 | Fill #3

## 2018-01-25 ENCOUNTER — Telehealth: Payer: Self-pay | Admitting: Pharmacist

## 2018-01-25 NOTE — Telephone Encounter (Signed)
Oral Chemotherapy Pharmacist Encounter   Attempted to reach patient for follow up on oral medication: [place]. No answer. Left VM for patient to call back with any questions or issues.   [name], PharmD, BCPS, BCOP  01/25/2018   12:39 PM Oral Oncology Clinic [phone]

## 2018-01-30 ENCOUNTER — Other Ambulatory Visit: Payer: Self-pay | Admitting: Hematology

## 2018-01-30 DIAGNOSIS — C49A2 Gastrointestinal stromal tumor of stomach: Secondary | ICD-10-CM

## 2018-02-03 ENCOUNTER — Other Ambulatory Visit: Payer: Self-pay | Admitting: Internal Medicine

## 2018-02-05 MED FILL — IMATINIB MESYLATE 400 MG TA: 400 | 30 days supply | Qty: 30 | Fill #0

## 2018-03-01 MED FILL — IMATINIB MESYLATE 400 MG TA: 400 | 30 days supply | Qty: 30 | Fill #1

## 2018-03-19 ENCOUNTER — Other Ambulatory Visit: Payer: Self-pay | Admitting: Internal Medicine

## 2018-03-29 MED FILL — IMATINIB MESYLATE 400 MG TA: 400 | 30 days supply | Qty: 30 | Fill #2

## 2018-04-22 ENCOUNTER — Other Ambulatory Visit: Payer: Self-pay | Admitting: Internal Medicine

## 2018-05-13 MED FILL — IMATINIB MESYLATE 400 MG TA: 400 | 30 days supply | Qty: 30 | Fill #3

## 2018-05-22 ENCOUNTER — Telehealth: Payer: Self-pay | Admitting: Hematology

## 2018-05-22 NOTE — Telephone Encounter (Signed)
R/s appt per 1/8 sch message- pt is aware of new appt date and time requested latest Friday appt possible due to job

## 2018-05-24 ENCOUNTER — Inpatient Hospital Stay: Payer: BLUE CROSS/BLUE SHIELD | Admitting: Hematology

## 2018-05-24 ENCOUNTER — Inpatient Hospital Stay: Payer: BLUE CROSS/BLUE SHIELD

## 2018-05-30 ENCOUNTER — Other Ambulatory Visit: Payer: Self-pay | Admitting: Hematology

## 2018-05-30 DIAGNOSIS — C49A2 Gastrointestinal stromal tumor of stomach: Secondary | ICD-10-CM

## 2018-05-31 ENCOUNTER — Ambulatory Visit: Payer: BLUE CROSS/BLUE SHIELD | Admitting: Hematology

## 2018-05-31 ENCOUNTER — Other Ambulatory Visit: Payer: BLUE CROSS/BLUE SHIELD

## 2018-06-06 MED FILL — IMATINIB MESYLATE 400 MG TA: 400 | 30 days supply | Qty: 30 | Fill #0

## 2018-06-12 NOTE — Progress Notes (Signed)
[name]   Telephone:(336) [phone] Fax:(336) [phone]   Clinic Follow up Note   Patient Care Team: Elby Showers, MD as PCP - General (Internal Medicine)  Date of Service:  06/14/2018  CHIEF COMPLAINT: F/u of GIST of Stomach   SUMMARY OF ONCOLOGIC HISTORY: Oncology History   Cancer Staging 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[place]) Staging form: Gastrointestinal Stromal Tumor - Gastric and Omental GIST, AJCC 8th Edition - Pathologic stage from 10/26/2016: Stage IIIB (pT4, pN0, cM0, Mitotic Rate: High) - Signed by Truitt Merle, MD on 01/20/2017       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[place])   08/18/2016 Imaging    Abdominal MRI with and without contrast showed a 10.3 cm mass in the left upper quadrant at adjacent to the gastric fundus    10/26/2016 Initial Diagnosis    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[place])    10/26/2016 Surgery    laparoscopic partial gastrectomy by Dr. 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 He was last seen by me 5 months ago. He presents to the clinic today by himself. He notes he is doing well. He is tolerating Gleevec very well with no issues. He denies any pain or abdominal issues, with normal BMs. He recently followed up with nephrologist.      REVIEW OF SYSTEMS:   Constitutional: Denies fevers, chills or abnormal weight loss Eyes: Denies blurriness of vision Ears, nose, mouth, throat, and face:  Denies mucositis or sore throat Respiratory: Denies cough, dyspnea or wheezes Cardiovascular: Denies palpitation, chest discomfort or lower extremity swelling Gastrointestinal:  Denies nausea, heartburn or change in bowel habits Skin: Denies abnormal skin rashes Lymphatics: Denies new lymphadenopathy or easy bruising Neurological:Denies numbness, tingling or new weaknesses Behavioral/Psych: Mood is stable, no new changes  All other systems were reviewed with the patient and are negative.   MEDICAL HISTORY:  Past Medical History:  Diagnosis Date  . Chronic kidney disease, stage III (moderate) (HCC)   . Gastrointestinal stromal tumor (GIST) ([place])   . Hypertension   . 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 PLA2R staining +. 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 COLONOSCOPY    . EUS N/A 09/21/2016   Procedure: UPPER ENDOSCOPIC ULTRASOUND (EUS) LINEAR;  Surgeon: Milus Banister, MD;  Location: [facility];  Service: Endoscopy;  Laterality: N/A;  . LAPAROSCOPIC GASTRECTOMY N/A 10/26/2016   Procedure: LAPAROSCOPIC PARTIAL GASTRECTOMY;  Surgeon: Stark Klein, MD;  Location: [facility];  Service: General;  Laterality: N/A;  . LAPAROSCOPIC PARTIAL GASTRECTOMY  10/26/2016  . SURGICAL EXCISION OF EXCESSIVE SKIN     fatty tissue    I have reviewed the social history and family history with the patient and they are unchanged from previous note.  ALLERGIES:  is allergic  to no known allergies.  MEDICATIONS:  Current Outpatient Medications  Medication Sig Dispense Refill  . amLODipine (NORVASC) 10 MG tablet TAKE 1 TABLET BY MOUTH EVERY DAY 90 tablet 3  . furosemide (LASIX) 40 MG tablet TAKE 1 TABLET (40 MG TOTAL) BY MOUTH DAILY. 90 tablet 0  . hydrALAZINE (APRESOLINE) 50 MG tablet Take 50 mg by mouth 3 (three) times daily.    Marland Kitchen  imatinib (GLEEVEC) 400 MG tablet TAKE 1 TABLET (400 MG TOTAL) BY MOUTH DAILY. TAKE WITH MEALS AND LARGE GLASS OF WATER.CAUTION:CHEMOTHERAPY. 30 tablet 3  . KLOR-CON M20 20 MEQ tablet TAKE 2 TABLETS BY MOUTH EVERY DAY 180 tablet 5  . labetalol (NORMODYNE) 300 MG tablet Take 300 mg by mouth every 8 (eight) hours.  5  . losartan (COZAAR) 100 MG tablet TAKE 1 TABLET BY MOUTH EVERY DAY 90 tablet 1   No current facility-administered medications for this visit.     PHYSICAL EXAMINATION: ECOG PERFORMANCE STATUS: 0 - Asymptomatic  Vitals:   06/14/18 1524  BP: (!) 156/144  Pulse: 71  Resp: 18  Temp: 98.2 F (36.8 C)  SpO2: 99%   Filed Weights   06/14/18 1524  Weight: 263 lb 11.2 oz (119.6 kg)    GENERAL:alert, no distress and comfortable SKIN: skin color, texture, turgor are normal, no rashes or significant lesions EYES: normal, Conjunctiva are pink and non-injected, sclera clear OROPHARYNX:no exudate, no erythema and lips, buccal mucosa, and tongue normal  NECK: supple, thyroid normal size, non-tender, without nodularity LYMPH:  no palpable lymphadenopathy in the cervical, axillary or inguinal LUNGS: clear to auscultation and percussion with normal breathing effort HEART: regular rate & rhythm and no murmurs and no lower extremity edema ABDOMEN:abdomen soft, non-tender and normal bowel sounds Musculoskeletal:no cyanosis of digits and no clubbing  NEURO: alert & oriented x 3 with fluent speech, no focal motor/sensory deficits  LABORATORY DATA:  I have reviewed the data as listed CBC Latest Ref Rng & Units 06/14/2018 01/04/2018 10/12/2017  WBC 4.0 - 10.5 K/uL 3.6(L) 4.3 3.7(L)  Hemoglobin 13.0 - 17.0 g/dL 10.8(L) 10.6(L) 11.3(L)  Hematocrit 39.0 - 52.0 % 34.3(L) 33.1(L) 34.4(L)  Platelets 150 - 400 K/uL 211 219 238     CMP Latest Ref Rng & Units 06/14/2018 01/04/2018 10/12/2017  Glucose 70 - 99 mg/dL 97 76 104  BUN 6 - 20 mg/dL 25(H) 27(H) 22  Creatinine 0.61 - 1.24 mg/dL 2.55(H)  2.46(H) 2.36(H)  Sodium 135 - 145 mmol/L 141 144 140  Potassium 3.5 - 5.1 mmol/L 4.5 3.9 4.2  Chloride 98 - 111 mmol/L 104 107 106  CO2 22 - 32 mmol/L _0 Calcium 8.9 - 10.3 mg/dL 9.0 9.3 9.1  Total Protein 6.5 - 8.1 g/dL 6.8 7.3 6.9  Total Bilirubin 0.3 - 1.2 mg/dL 0.5 0.5 0.8  Alkaline Phos 38 - 126 U/L 78 95 72  AST 15 - 41 U/L 31 46(H) 37(H)  ALT 0 - 44 U/L 27 43 37      RADIOGRAPHIC STUDIES: I have personally reviewed the radiological images as listed and agreed with the findings in the report. No results found.   ASSESSMENT & PLAN:  Larry Clements is a 63 y.o. male with   1. Gastrointestinal stromal tumor of the stomach, pT4N0M0, stage IIIB, with high mitotic rate 13/50, grade 2. -He was diagnosed in 10/2016. He is s/p partial gastrectomy for complete GIST removal.  -Per NCCN counseling, giving the size of the tumor, and high mitotic  rate, he is at very high risk for recurrence (86%), and standard care is adjuvant imatinib (Gleevec) 400 mg daily for 3 years, no need dose adjustments based on his current renal function.  -His tumor ckit mutation was positive, which predicts could benefit of adjuvant imatinib  -He started adjuvant Imatinib 400 mg daily for 3 years starting 12/27/16. He has been tolerating Gleevec well so far with no issues.  -He is clinically doing well. Labs reviewed, CBC WNL except Hg at 10.8 , WBC at 3.6. This is related to his kidney disease and possibly Gleevec. CMP showed Cr 2.55, overall stable  -He continues to tolerate Gleevec well, plan to complete in summer of 2021.  -follow up in 4 months with surveillance CT scan, then f/u every 6 months    2. Chronic kidney disease, stage III -will continue f/u his nephrologist -He underwent biopsy in January 2019, found to be stage II-III CKD  3. Hypertension  -Patient on Norvasc, losartan, lasix, and hydralazine currently. -Continue to follow up with his PCP regarding his blood pressure -156/144  today (06/14/18). He is asymptomatic. I strongly urge him to see his PCP or nephologist ASAP to adjust his BP meds   4.  Bilateral kidney cysts -Found on prior imaging study, stable   Plan:  -Continue imatinib, refilled today   -F/u in 4 months with lab and CTA abd without contrast one week before     No problem-specific Assessment & Plan notes found for this encounter.   Orders Placed This Encounter  Procedures  . CT Abdomen Pelvis Wo Contrast    Standing Status:   Future    Standing Expiration Date:   06/14/2019    Order Specific Question:   Preferred imaging location?    Answer:   [facility]    Order Specific Question:   Is Oral Contrast requested for this exam?    Answer:   Yes, Per Radiology protocol    Order Specific Question:   Radiology Contrast Protocol - do NOT remove file path    Answer:   [email or web address]   All questions were answered. The patient knows to call the clinic with any problems, questions or concerns. No barriers to learning was detected. I spent 20 minutes counseling the patient face to face. The total time spent in the appointment was 25 minutes and more than 50% was on counseling and review of test results     Truitt Merle, MD 06/14/2018   I, Joslyn Devon, am acting as scribe for Truitt Merle, MD.   I have reviewed the above documentation for accuracy and completeness, and I agree with the above.

## 2018-06-14 ENCOUNTER — Inpatient Hospital Stay: Payer: BLUE CROSS/BLUE SHIELD | Attending: Hematology

## 2018-06-14 ENCOUNTER — Inpatient Hospital Stay (HOSPITAL_BASED_OUTPATIENT_CLINIC_OR_DEPARTMENT_OTHER): Payer: BLUE CROSS/BLUE SHIELD | Admitting: Hematology

## 2018-06-14 ENCOUNTER — Encounter: Payer: Self-pay | Admitting: Hematology

## 2018-06-14 VITALS — BP 156/144 | HR 71 | Temp 98.2°F | Resp 18 | Ht 76.0 in | Wt 263.7 lb

## 2018-06-14 DIAGNOSIS — N281 Cyst of kidney, acquired: Secondary | ICD-10-CM | POA: Diagnosis not present

## 2018-06-14 DIAGNOSIS — C49A0 Gastrointestinal stromal tumor, unspecified site: Secondary | ICD-10-CM

## 2018-06-14 DIAGNOSIS — N183 Chronic kidney disease, stage 3 (moderate): Secondary | ICD-10-CM | POA: Insufficient documentation

## 2018-06-14 DIAGNOSIS — Z79899 Other long term (current) drug therapy: Secondary | ICD-10-CM | POA: Insufficient documentation

## 2018-06-14 DIAGNOSIS — I129 Hypertensive chronic kidney disease with stage 1 through stage 4 chronic kidney disease, or unspecified chronic kidney disease: Secondary | ICD-10-CM

## 2018-06-14 DIAGNOSIS — C49A2 Gastrointestinal stromal tumor of stomach: Secondary | ICD-10-CM | POA: Insufficient documentation

## 2018-06-14 LAB — CBC WITH DIFFERENTIAL (CANCER CENTER ONLY)
Abs Immature Granulocytes: 0 10*3/uL (ref 0.00–0.07)
BASOS ABS: 0 10*3/uL (ref 0.0–0.1)
Basophils Relative: 1 %
Eosinophils Absolute: 0.2 10*3/uL (ref 0.0–0.5)
Eosinophils Relative: 5 %
HEMATOCRIT: 34.3 % — AB (ref 39.0–52.0)
HEMOGLOBIN: 10.8 g/dL — AB (ref 13.0–17.0)
IMMATURE GRANULOCYTES: 0 %
Lymphocytes Relative: 26 %
Lymphs Abs: 1 10*3/uL (ref 0.7–4.0)
MCH: 29.5 pg (ref 26.0–34.0)
MCHC: 31.5 g/dL (ref 30.0–36.0)
MCV: 93.7 fL (ref 80.0–100.0)
MONO ABS: 0.4 10*3/uL (ref 0.1–1.0)
MONOS PCT: 12 %
NEUTROS PCT: 56 %
Neutro Abs: 2 10*3/uL (ref 1.7–7.7)
Platelet Count: 211 10*3/uL (ref 150–400)
RBC: 3.66 MIL/uL — ABNORMAL LOW (ref 4.22–5.81)
RDW: 12.7 % (ref 11.5–15.5)
WBC: 3.6 10*3/uL — AB (ref 4.0–10.5)
nRBC: 0 % (ref 0.0–0.2)

## 2018-06-14 LAB — CMP (CANCER CENTER ONLY)
ALBUMIN: 3.8 g/dL (ref 3.5–5.0)
ALK PHOS: 78 U/L (ref 38–126)
ALT: 27 U/L (ref 0–44)
ANION GAP: 8 (ref 5–15)
AST: 31 U/L (ref 15–41)
BILIRUBIN TOTAL: 0.5 mg/dL (ref 0.3–1.2)
BUN: 25 mg/dL — AB (ref 6–20)
CALCIUM: 9 mg/dL (ref 8.9–10.3)
CO2: 29 mmol/L (ref 22–32)
Chloride: 104 mmol/L (ref 98–111)
Creatinine: 2.55 mg/dL — ABNORMAL HIGH (ref 0.61–1.24)
GFR, Est AFR Am: 30 mL/min — ABNORMAL LOW (ref >60)
GFR, Estimated: 26 mL/min — ABNORMAL LOW (ref >60)
GLUCOSE: 97 mg/dL (ref 70–99)
Potassium: 4.5 mmol/L (ref 3.5–5.1)
Sodium: 141 mmol/L (ref 135–145)
TOTAL PROTEIN: 6.8 g/dL (ref 6.5–8.1)

## 2018-06-17 ENCOUNTER — Telehealth: Payer: Self-pay | Admitting: Hematology

## 2018-06-17 NOTE — Telephone Encounter (Signed)
Scheduled appt per 01/31 los.  Called patient and patient is aware of appt.  Patient stated that he will call and schedule his CT scan later, due to him still paying off a prior CT he head.

## 2018-06-26 NOTE — Telephone Encounter (Signed)
Scheduled appt per 01/31 los.  Called patient and patient is aware of appt.  Patient stated that he will call and schedule his CT scan later, due to him still paying off a prior CT he had.

## 2018-07-04 MED FILL — IMATINIB MESYLATE 400 MG TA: 400 | 30 days supply | Qty: 30 | Fill #1

## 2018-08-01 MED FILL — IMATINIB MESYLATE 400 MG TA: 400 | 30 days supply | Qty: 30 | Fill #2

## 2018-08-28 MED FILL — IMATINIB MESYLATE 400 MG TA: 400 | 30 days supply | Qty: 30 | Fill #3

## 2018-09-20 ENCOUNTER — Other Ambulatory Visit: Payer: Self-pay | Admitting: Hematology

## 2018-09-20 DIAGNOSIS — C49A2 Gastrointestinal stromal tumor of stomach: Secondary | ICD-10-CM

## 2018-09-24 MED FILL — IMATINIB MESYLATE 400 MG TA: 400 | 30 days supply | Qty: 30 | Fill #0

## 2018-09-26 ENCOUNTER — Telehealth: Payer: Self-pay | Admitting: Hematology

## 2018-09-26 NOTE — Telephone Encounter (Signed)
Lab closing early 5/27. Time changed. Left message. Schedule. Mailed.

## 2018-09-30 ENCOUNTER — Other Ambulatory Visit: Payer: Self-pay | Admitting: Hematology

## 2018-10-01 ENCOUNTER — Other Ambulatory Visit: Payer: Self-pay | Admitting: Hematology

## 2018-10-01 ENCOUNTER — Telehealth (HOSPITAL_COMMUNITY): Payer: Self-pay

## 2018-10-01 ENCOUNTER — Telehealth: Payer: Self-pay | Admitting: *Deleted

## 2018-10-01 NOTE — Telephone Encounter (Signed)
Received telehealth message from 09/30/18 @ 5:17 pm that pt wants to r/s June appt due to being on vacation.  Will send message to schedulers.

## 2018-10-09 ENCOUNTER — Inpatient Hospital Stay: Payer: BLUE CROSS/BLUE SHIELD | Attending: Hematology

## 2018-10-09 ENCOUNTER — Telehealth: Payer: Self-pay

## 2018-10-09 NOTE — Telephone Encounter (Signed)
Left voice message for patient with Central Scheduling's number [phone] so he can call and get it scheduled next week.  I will schedule a lab appointment prior and a f/u with Dr. Burr Medico a few days after.

## 2018-10-11 ENCOUNTER — Ambulatory Visit: Payer: BLUE CROSS/BLUE SHIELD | Admitting: Hematology

## 2018-10-14 NOTE — Telephone Encounter (Signed)
Called patient regarding CT scan, notified him scheduled for Thursday 6/4 must arrive at [facility] by 12:15, nothing to eat for 4 hours prior, drink contrast one bottle at 10:30 am and second bottle at 11:30 am same day, pick up contrast at least the day prior.  He verbalized an understanding.

## 2018-10-17 ENCOUNTER — Ambulatory Visit (HOSPITAL_COMMUNITY): Payer: BLUE CROSS/BLUE SHIELD

## 2018-10-17 ENCOUNTER — Ambulatory Visit: Payer: BLUE CROSS/BLUE SHIELD | Admitting: Hematology

## 2018-10-21 MED FILL — IMATINIB MESYLATE 400 MG TA: 400 | 30 days supply | Qty: 30 | Fill #1

## 2018-10-23 ENCOUNTER — Telehealth: Payer: Self-pay | Admitting: Pharmacist

## 2018-10-23 NOTE — Telephone Encounter (Signed)
Oral Oncology Pharmacist Encounter  Received request for insurance authorization for [place] (imatinib) from [place]. [place] request form completed and faxed to [phone] Phone number for follow-up [phone]  This encounter will continue to be updated until final determination.  [name], PharmD, BCPS, BCOP  10/23/2018 3:57 PM Oral Oncology Clinic [phone]

## 2018-10-24 NOTE — Telephone Encounter (Signed)
Oral Oncology Pharmacist Encounter  Insurance authorization for imatinib has been approved by [employer].  Ref# [id number] Effective dates: 10/24/2018-10/24/2019  [name], PharmD, BCPS, BCOP  10/24/2018 1:43 PM Oral Oncology Clinic [phone]

## 2018-11-01 ENCOUNTER — Other Ambulatory Visit: Payer: Self-pay | Admitting: Internal Medicine

## 2018-11-13 MED FILL — IMATINIB MESYLATE 400 MG TA: 400 | 30 days supply | Qty: 30 | Fill #2

## 2018-12-12 MED FILL — IMATINIB MESYLATE 400 MG TA: 400 | 30 days supply | Qty: 30 | Fill #3

## 2018-12-23 ENCOUNTER — Other Ambulatory Visit: Payer: Self-pay | Admitting: Urology

## 2018-12-23 ENCOUNTER — Other Ambulatory Visit (HOSPITAL_COMMUNITY): Payer: Self-pay | Admitting: Urology

## 2018-12-23 DIAGNOSIS — R972 Elevated prostate specific antigen [PSA]: Secondary | ICD-10-CM

## 2019-01-07 ENCOUNTER — Other Ambulatory Visit: Payer: Self-pay | Admitting: Hematology

## 2019-01-07 DIAGNOSIS — C49A2 Gastrointestinal stromal tumor of stomach: Secondary | ICD-10-CM

## 2019-01-09 MED FILL — IMATINIB MESYLATE 400 MG TA: 400 | 30 days supply | Qty: 30 | Fill #0

## 2019-01-17 ENCOUNTER — Other Ambulatory Visit: Payer: Self-pay

## 2019-01-17 ENCOUNTER — Other Ambulatory Visit: Payer: Self-pay | Admitting: Urology

## 2019-01-17 ENCOUNTER — Ambulatory Visit
Admission: RE | Admit: 2019-01-17 | Discharge: 2019-01-17 | Disposition: A | Discharge status: Home / Self Care | Payer: BC Managed Care – PPO | Source: Ambulatory Visit | Attending: Urology | Admitting: Urology

## 2019-01-17 DIAGNOSIS — R972 Elevated prostate specific antigen [PSA]: Secondary | ICD-10-CM

## 2019-01-27 ENCOUNTER — Other Ambulatory Visit: Payer: Self-pay | Admitting: Internal Medicine

## 2019-01-30 ENCOUNTER — Other Ambulatory Visit: Payer: Self-pay | Admitting: Internal Medicine

## 2019-02-10 MED FILL — IMATINIB MESYLATE 400 MG TA: 400 | 30 days supply | Qty: 30 | Fill #1

## 2019-02-20 ENCOUNTER — Other Ambulatory Visit: Payer: Self-pay | Admitting: Internal Medicine

## 2019-03-07 MED FILL — IMATINIB MESYLATE 400 MG TA: 400 | 30 days supply | Qty: 30 | Fill #2

## 2019-04-03 MED FILL — IMATINIB MESYLATE 400 MG TA: 400 | 30 days supply | Qty: 30 | Fill #3

## 2019-04-04 ENCOUNTER — Telehealth: Payer: Self-pay

## 2019-04-04 ENCOUNTER — Telehealth: Payer: Self-pay | Admitting: Hematology

## 2019-04-04 NOTE — Telephone Encounter (Signed)
Patient's daughter Loma Sousa calls stating that since he has been on [place] his kidney function has gotten worse and his PSA has gone up.  He saw nephrologist yesterday and has his lab results.    She is wanting him to either significantly reduce the dose of Gleevec or come off of it.  Her [phone]     Message was given to Dr. Burr Medico

## 2019-04-04 NOTE — Telephone Encounter (Signed)
Scheduled appt per 11/20 sch message - pt is aware of appt date and time   

## 2019-04-04 NOTE — Telephone Encounter (Signed)
I left voice message for patient per Dr. Burr Medico he needs to come in for an appointment, we have not seen him since 05/2018.  I explained scheduling will be calling with an appointment and he must keep it.  I called his daughter Loma Sousa back to let her know the same.

## 2019-04-17 NOTE — Progress Notes (Signed)
[facility]   [phone] [phone]   Clinic Follow up Note   Patient Care Team: Baxley, Cresenciano Lick, MD as PCP - General (Internal Medicine) Festus Aloe, MD as Consulting Physician (Urology) Jamal Maes, MD as Consulting Physician (Nephrology) Truitt Merle, MD as Consulting Physician (Hematology)  Date of Service:  04/18/2019  CHIEF COMPLAINT: F/u of GIST of Stomach   SUMMARY OF ONCOLOGIC HISTORY: Oncology History Overview Note  Cancer Staging 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[place]) Staging form: Gastrointestinal Stromal Tumor - Gastric and Omental GIST, AJCC 8th Edition - Pathologic stage from 10/26/2016: Stage IIIB (pT4, pN0, cM0, Mitotic Rate: High) - Signed by Truitt Merle, MD on 01/20/2017     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[place])  08/18/2016 Imaging   Abdominal MRI with and without contrast showed a 10.3 cm mass in the left upper quadrant at adjacent to the gastric fundus   10/26/2016 Initial Diagnosis   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[place])   10/26/2016 Surgery   laparoscopic partial gastrectomy by Dr. 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 (+)   10/26/2016 Initial Diagnosis   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[place])   10/26/2016 Pathology Results   Stomach, resection for tumor, Greater curve - GASTROINTESTINAL STROMAL TUMOR, SPINDLE CELL TYPE, 10.5 CM - MARGINS UNINVOLVED BY TUMOR  Microscopic Comment GASTROINTESTINAL STROMAL TUMOR (GIST): Procedure: Resection, partial gastrectomy Tumor Site: Not specified Tumor Size: 10.5 cm Tumor Focality: Unifocal Histologic Type: Gastrointestinal stromal tumor, spindle cell type Mitotic Rate:  13 /50 HIGH POWER FIELD Necrosis: Not identified Histologic Grade: G2 (High grade; mitotic rate >5/50 HIGH POWER FIELD) Risk Assessment: High Margins: Uninvolved by GIST      10/26/2016 Initial Diagnosis   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[place])   12/27/2016 -  Chemotherapy   Adjuvant imatinib (Gleevec) 400 mg daily for 3 years     10/26/2017 Surgery   Laparoscopic partial gastrostomy by Dr. Barry Dienes   12/27/2017 -  Adjuvant Chemotherapy   Adjuvant imatinib 400 mg daily, plan for 3 years       CURRENT THERAPY:  Adjuvant imatinib (Gleevec) 400 mg daily for 3 years, started on 12/27/2016. Stopped 04/18/19   INTERVAL HISTORY:  Larry Clements is here for a follow up of GIST. He was last seen by me 11 months ago. He presents to the clinic with his daughter. He notes he has been doing well. He notes no new pain or issues. His daughter is concerns about his kidney function being related to his [place]. His CR was in 2 range which has been there since 2018. He has been taking Gleevec once daily. He denies any diarrhea or stomach issues. He notes his BP was 010-272 range systolically.  He denies issues urinating but does have elevated PSA level that his daughter is concerned with. She requested to have PSA checked today.  She notes she will help him with his doctor visits and staying on top of his health. She has been set up as a point of contact for him.     REVIEW OF SYSTEMS:   Constitutional: Denies fevers, chills or abnormal weight loss Eyes: Denies blurriness of vision Ears, nose, mouth, throat, and face: Denies mucositis or sore throat Respiratory:  Denies cough, dyspnea or wheezes Cardiovascular: Denies palpitation, chest discomfort or lower extremity swelling Gastrointestinal:  Denies nausea, heartburn or change in bowel habits Skin: Denies abnormal skin rashes Lymphatics: Denies new lymphadenopathy or easy bruising Neurological:Denies numbness, tingling  or new weaknesses Behavioral/Psych: Mood is stable, no new changes  All other systems were reviewed with the patient and are negative.  MEDICAL HISTORY:  Past Medical History:  Diagnosis Date  . Chronic kidney disease, stage III (moderate)   . Gastrointestinal stromal tumor (GIST) ([place])   . Hypertension   . 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 PLA2R staining +. 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 COLONOSCOPY    . EUS N/A 09/21/2016   Procedure: UPPER ENDOSCOPIC ULTRASOUND (EUS) LINEAR;  Surgeon: Milus Banister, MD;  Location: [facility];  Service: Endoscopy;  Laterality: N/A;  . LAPAROSCOPIC GASTRECTOMY N/A 10/26/2016   Procedure: LAPAROSCOPIC PARTIAL GASTRECTOMY;  Surgeon: Stark Klein, MD;  Location: [facility];  Service: General;  Laterality: N/A;  . LAPAROSCOPIC PARTIAL GASTRECTOMY  10/26/2016  . SURGICAL EXCISION OF EXCESSIVE SKIN     fatty tissue    I have reviewed the social history and family history with the patient and they are unchanged from previous note.  ALLERGIES:  is allergic to no known allergies.  MEDICATIONS:  Current Outpatient Medications  Medication Sig Dispense Refill  . amLODipine (NORVASC) 10 MG tablet TAKE 1 TABLET BY MOUTH EVERY DAY 90 tablet 3  . calcitRIOL (ROCALTROL) 0.5 MCG capsule Take 0.5 mcg by mouth daily.    . furosemide (LASIX) 40 MG tablet TAKE 1 TABLET (40 MG TOTAL) BY MOUTH DAILY. 90 tablet 0  . hydrALAZINE (APRESOLINE) 50 MG tablet Take 50 mg by mouth 3 (three) times daily.    [place] imatinib (GLEEVEC) 400 MG tablet TAKE 1 TABLET (400 MG TOTAL) BY MOUTH DAILY. TAKE WITH MEALS AND LARGE GLASS OF WATER.CAUTION:CHEMOTHERAPY. 30 tablet 3  . KLOR-CON M20 20 MEQ tablet TAKE 2 TABLETS BY MOUTH EVERY DAY 180 tablet 5  . labetalol (NORMODYNE) 300 MG tablet Take 300 mg by mouth every  8 (eight) hours.  5  . losartan (COZAAR) 100 MG tablet TAKE 1 TABLET BY MOUTH EVERY DAY 90 tablet 0   No current facility-administered medications for this visit.     PHYSICAL EXAMINATION: ECOG PERFORMANCE STATUS: 0 - Asymptomatic  Vitals:   04/18/19 1115  BP: (!) 154/82  Pulse: 76  Resp: 20  Temp: 98.7 F (37.1 C)  SpO2: 92%   Filed Weights   04/18/19 1115  Weight: 256 lb 11.2 oz (116.4 kg)    GENERAL:alert, no distress and comfortable SKIN: skin color, texture, turgor are normal, no rashes or significant lesions EYES: normal, Conjunctiva are pink and non-injected, sclera clear  NECK: supple, thyroid normal size, non-tender, without nodularity LYMPH:  no palpable lymphadenopathy in the cervical, axillary  LUNGS: clear to auscultation and percussion with normal breathing effort HEART: regular rate & rhythm and no murmurs and no lower extremity edema ABDOMEN:abdomen soft, non-tender and normal bowel sounds Musculoskeletal:no cyanosis of digits and no clubbing  NEURO: alert & oriented x 3 with fluent speech, no focal motor/sensory deficits  LABORATORY DATA:  I have reviewed the data as listed CBC Latest Ref Rng & Units 04/18/2019 06/14/2018 01/04/2018  WBC 4.0 - 10.5 K/uL 5.5 3.6(L) 4.3  Hemoglobin 13.0 - 17.0  g/dL 10.6(L) 10.8(L) 10.6(L)  Hematocrit 39.0 - 52.0 % 33.6(L) 34.3(L) 33.1(L)  Platelets 150 - 400 K/uL 192 211 219     CMP Latest Ref Rng & Units 04/18/2019 06/14/2018 01/04/2018  Glucose 70 - 99 mg/dL 118(H) 97 76  BUN 8 - 23 mg/dL 29(H) 25(H) 27(H)  Creatinine 0.61 - 1.24 mg/dL 3.12(HH) 2.55(H) 2.46(H)  Sodium 135 - 145 mmol/L 138 141 144  Potassium 3.5 - 5.1 mmol/L 5.1 4.5 3.9  Chloride 98 - 111 mmol/L 105 104 107  CO2 22 - 32 mmol/L 25 29 27   Calcium 8.9 - 10.3 mg/dL 8.5(L) 9.0 9.3  Total Protein 6.5 - 8.1 g/dL 6.6 6.8 7.3  Total Bilirubin 0.3 - 1.2 mg/dL 0.6 0.5 0.5  Alkaline Phos 38 - 126 U/L 64 78 95  AST 15 - 41 U/L 29 31 46(H)  ALT 0 - 44 U/L 23 27 43       RADIOGRAPHIC STUDIES: I have personally reviewed the radiological images as listed and agreed with the findings in the report. No results found.   ASSESSMENT & PLAN:  Larry Clements is a 63 y.o. male with   1. 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 -He was diagnosed in 10/2016. He is s/p partial gastrectomy for complete GIST removal.  -Per NCCN counseling, giving the size of the tumor, and high mitotic rate, he is at very high risk for recurrence (86%).  -His tumor ckit mutation was positive, which predicts good benefit of adjuvant imatinib  -He started standard care with adjuvant Imatinib 400 mg daily for 3 years starting 12/27/16. He has not been compliant with his follow up -He is overall stable. Physical exam unremarkable. He continues to tolerate Gleevec well.  -Given his daughter's high concern with worsening kidney function he opted to stop Gleevec at this point. I reviewed data of more benefit from sweets versus to use of adjuvant Gleevec, he has been on [place] for 2 years and 4 months, I am okay to stop per his request, although I do not think his worsening CKD is related to [place].   -He is overdue for AP CT Scan, will order to monitor his GIST.  -I reviewed surveillance with scan yearly for the first 5 years and f/u every 6 months. I encouraged him to watch for pain and abdominal bloating, low appetite and weight loss without resolution.   2. Chronic kidney disease, stage IV -Will continuef/u hisnephrologist Dr Lorrene Reid -He underwent biopsy in January 2019 -We reviewed his lab since 2016. He has had CKD for more than 4 years, and it has been gradually getting worse over time. His daughter notes his recent Cr has increased to 2.8 range lately which indicates Stage IV CKD. She is concerned his worsening kidney function is related to [place].  -I discussed although Gleevec rarely cause renal insufficiency  unless pt has severe diarrhea from it, his long standing HTN appears to be the main cause for his CKD.  -will stop Gleevec per his request   3. Hypertension  -Patient on Norvasc, losartan, lasix, and hydralazine currently. -Has been better controlled lately but still elevated in 140-150. I discussed goal is 130/85 or below.  -I encouraged him to reduce salt intake.  -Continue to f/u with his PCP   4. Bilateral kidney cysts -Found on prior imaging study,has remained stable on prior images.   5. Elevated PSA -He will continue to f/u with Urologist Dr. Donell Sievert  -  His 01/2019 MRI prostate was normal.  -Per request I will check PSA today.   Plan: -Stop Gleevec per pt's request  -CT AP WO Contrast in 2-3 weeks -Copy note to Dr. Donell Sievert and Dr. Lorrene Reid, I will check PSA per his request   -Lab and f/u in 6 months    No problem-specific Assessment & Plan notes found for this encounter.   Orders Placed This Encounter  Procedures  . CT Abdomen Pelvis Wo Contrast    Standing Status:   Future    Standing Expiration Date:   04/18/2020    Order Specific Question:   Preferred imaging location?    Answer:   [facility]    Order Specific Question:   Is Oral Contrast requested for this exam?    Answer:   Yes, Per Radiology protocol    Order Specific Question:   Radiology Contrast Protocol - do NOT remove file path    Answer:   [email or web address]  . Prostate-Specific AG, Serum    Please add to lab draw from 04/18/2019    Standing Status:   Future    Standing Expiration Date:   04/18/2020   All questions were answered. The patient knows to call the clinic with any problems, questions or concerns. No barriers to learning was detected. I spent 20 minutes counseling the patient face to face. The total time spent in the appointment was 25 minutes and more than 50% was on counseling and review of test results     Truitt Merle, MD 04/18/2019   I, Joslyn Devon, am  acting as scribe for Truitt Merle, MD.   I have reviewed the above documentation for accuracy and completeness, and I agree with the above.

## 2019-04-18 ENCOUNTER — Inpatient Hospital Stay: Payer: BC Managed Care – PPO | Attending: Hematology

## 2019-04-18 ENCOUNTER — Encounter: Payer: Self-pay | Admitting: Hematology

## 2019-04-18 ENCOUNTER — Inpatient Hospital Stay (HOSPITAL_BASED_OUTPATIENT_CLINIC_OR_DEPARTMENT_OTHER): Payer: BC Managed Care – PPO | Admitting: Hematology

## 2019-04-18 ENCOUNTER — Ambulatory Visit: Payer: BC Managed Care – PPO | Admitting: Hematology

## 2019-04-18 ENCOUNTER — Other Ambulatory Visit: Payer: BC Managed Care – PPO

## 2019-04-18 ENCOUNTER — Other Ambulatory Visit: Payer: Self-pay

## 2019-04-18 VITALS — BP 154/82 | HR 76 | Temp 98.7°F | Resp 20 | Ht 76.0 in | Wt 256.7 lb

## 2019-04-18 DIAGNOSIS — N1832 Chronic kidney disease, stage 3b: Secondary | ICD-10-CM | POA: Diagnosis not present

## 2019-04-18 DIAGNOSIS — I129 Hypertensive chronic kidney disease with stage 1 through stage 4 chronic kidney disease, or unspecified chronic kidney disease: Secondary | ICD-10-CM | POA: Insufficient documentation

## 2019-04-18 DIAGNOSIS — I10 Essential (primary) hypertension: Secondary | ICD-10-CM | POA: Diagnosis not present

## 2019-04-18 DIAGNOSIS — R972 Elevated prostate specific antigen [PSA]: Secondary | ICD-10-CM

## 2019-04-18 DIAGNOSIS — C49A2 Gastrointestinal stromal tumor of stomach: Secondary | ICD-10-CM | POA: Diagnosis not present

## 2019-04-18 DIAGNOSIS — Z79899 Other long term (current) drug therapy: Secondary | ICD-10-CM | POA: Insufficient documentation

## 2019-04-18 DIAGNOSIS — G629 Polyneuropathy, unspecified: Secondary | ICD-10-CM | POA: Insufficient documentation

## 2019-04-18 DIAGNOSIS — C49A0 Gastrointestinal stromal tumor, unspecified site: Secondary | ICD-10-CM

## 2019-04-18 DIAGNOSIS — N184 Chronic kidney disease, stage 4 (severe): Secondary | ICD-10-CM | POA: Insufficient documentation

## 2019-04-18 LAB — CMP (CANCER CENTER ONLY)
ALT: 23 U/L (ref 0–44)
AST: 29 U/L (ref 15–41)
Albumin: 3.2 g/dL — ABNORMAL LOW (ref 3.5–5.0)
Alkaline Phosphatase: 64 U/L (ref 38–126)
Anion gap: 8 (ref 5–15)
BUN: 29 mg/dL — ABNORMAL HIGH (ref 8–23)
CO2: 25 mmol/L (ref 22–32)
Calcium: 8.5 mg/dL — ABNORMAL LOW (ref 8.9–10.3)
Chloride: 105 mmol/L (ref 98–111)
Creatinine: 3.12 mg/dL (ref 0.61–1.24)
GFR, Est AFR Am: 24 mL/min — ABNORMAL LOW (ref >60)
GFR, Estimated: 20 mL/min — ABNORMAL LOW (ref >60)
Glucose, Bld: 118 mg/dL — ABNORMAL HIGH (ref 70–99)
Potassium: 5.1 mmol/L (ref 3.5–5.1)
Sodium: 138 mmol/L (ref 135–145)
Total Bilirubin: 0.6 mg/dL (ref 0.3–1.2)
Total Protein: 6.6 g/dL (ref 6.5–8.1)

## 2019-04-18 LAB — CBC WITH DIFFERENTIAL (CANCER CENTER ONLY)
Abs Immature Granulocytes: 0.01 10*3/uL (ref 0.00–0.07)
Basophils Absolute: 0 10*3/uL (ref 0.0–0.1)
Basophils Relative: 0 %
Eosinophils Absolute: 0 10*3/uL (ref 0.0–0.5)
Eosinophils Relative: 0 %
HCT: 33.6 % — ABNORMAL LOW (ref 39.0–52.0)
Hemoglobin: 10.6 g/dL — ABNORMAL LOW (ref 13.0–17.0)
Immature Granulocytes: 0 %
Lymphocytes Relative: 12 %
Lymphs Abs: 0.6 10*3/uL — ABNORMAL LOW (ref 0.7–4.0)
MCH: 29 pg (ref 26.0–34.0)
MCHC: 31.5 g/dL (ref 30.0–36.0)
MCV: 92.1 fL (ref 80.0–100.0)
Monocytes Absolute: 0.5 10*3/uL (ref 0.1–1.0)
Monocytes Relative: 9 %
Neutro Abs: 4.3 10*3/uL (ref 1.7–7.7)
Neutrophils Relative %: 79 %
Platelet Count: 192 10*3/uL (ref 150–400)
RBC: 3.65 MIL/uL — ABNORMAL LOW (ref 4.22–5.81)
RDW: 12.2 % (ref 11.5–15.5)
WBC Count: 5.5 10*3/uL (ref 4.0–10.5)
nRBC: 0 % (ref 0.0–0.2)

## 2019-04-19 ENCOUNTER — Encounter: Payer: Self-pay | Admitting: Hematology

## 2019-04-19 DIAGNOSIS — C49A2 Gastrointestinal stromal tumor of stomach: Secondary | ICD-10-CM | POA: Diagnosis not present

## 2019-04-21 ENCOUNTER — Telehealth: Payer: Self-pay | Admitting: Hematology

## 2019-04-21 NOTE — Telephone Encounter (Signed)
Called and informed the pt of the scheduled appt per 12/4 los.  Spoke with pt and he is aware of the appt date and time.

## 2019-04-22 LAB — PROSTATE-SPECIFIC AG, SERUM (LABCORP): Prostate Specific Ag, Serum: 11 ng/mL — ABNORMAL HIGH (ref 0.0–4.0)

## 2019-04-23 ENCOUNTER — Telehealth: Payer: Self-pay

## 2019-04-23 NOTE — Telephone Encounter (Signed)
Faxed Dr. Ernestina Penna recent office note to Dr. Jamal Maes and Dr. Rodman Key Eskridge's offices for review.

## 2019-07-01 ENCOUNTER — Other Ambulatory Visit: Payer: Self-pay | Admitting: Internal Medicine

## 2019-07-13 IMAGING — US US BIOPSY
1 series · 8 of 8 positions shown · non-contrast
Comparison: none

INDICATION: 61-year-old with hypertension and unspecified proteinuria.

[Series 1: us biopsy · 0.25mm/px · 8 of 8 slices shown]
[im 1/8]
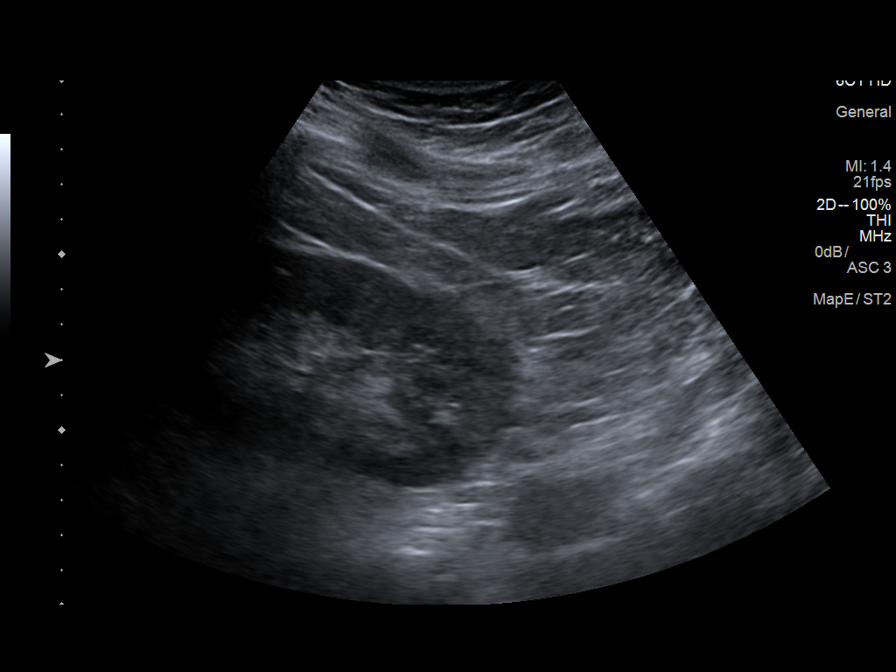
[im 2/8]
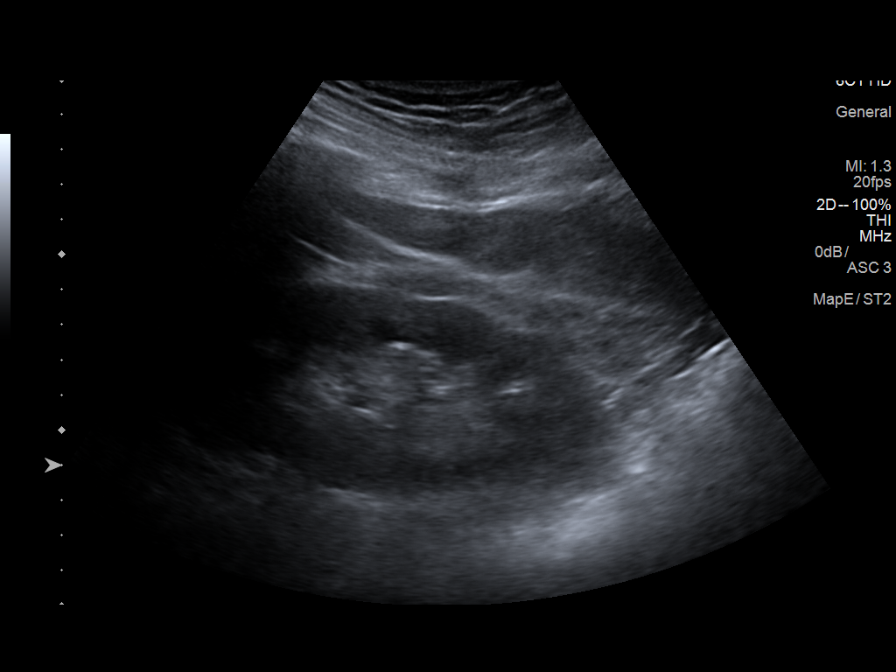
[im 3/8]
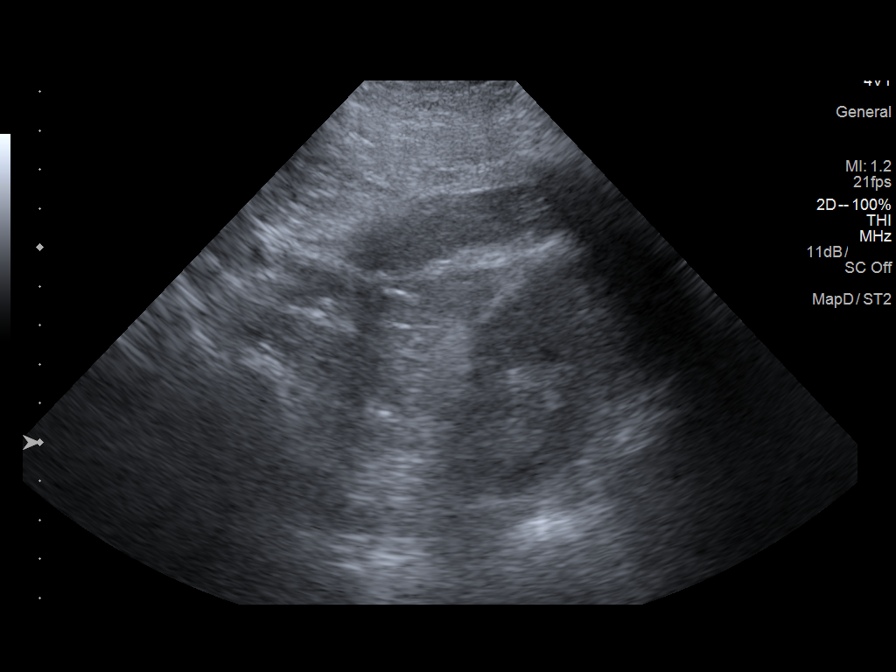
[im 4/8]
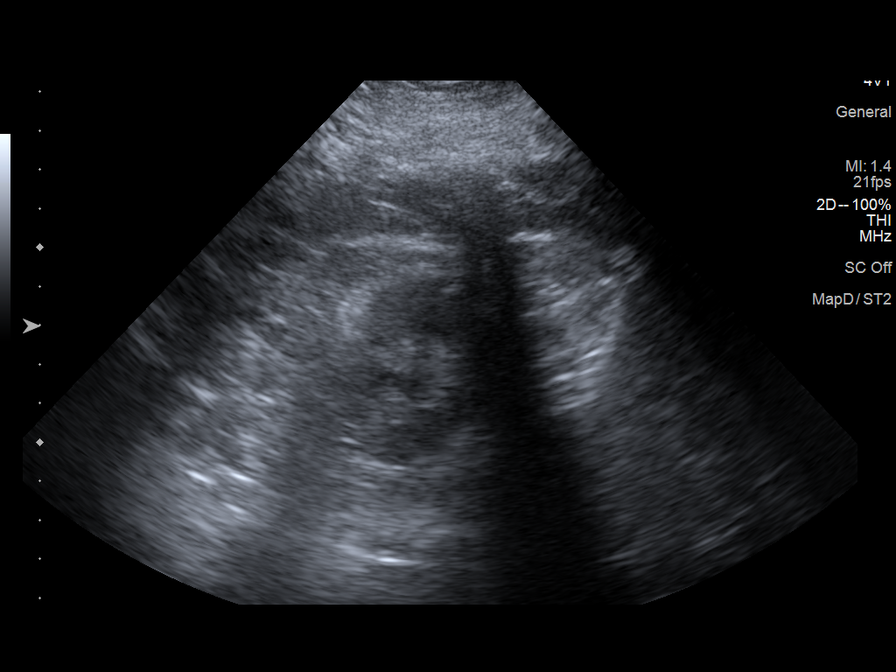
[im 5/8]
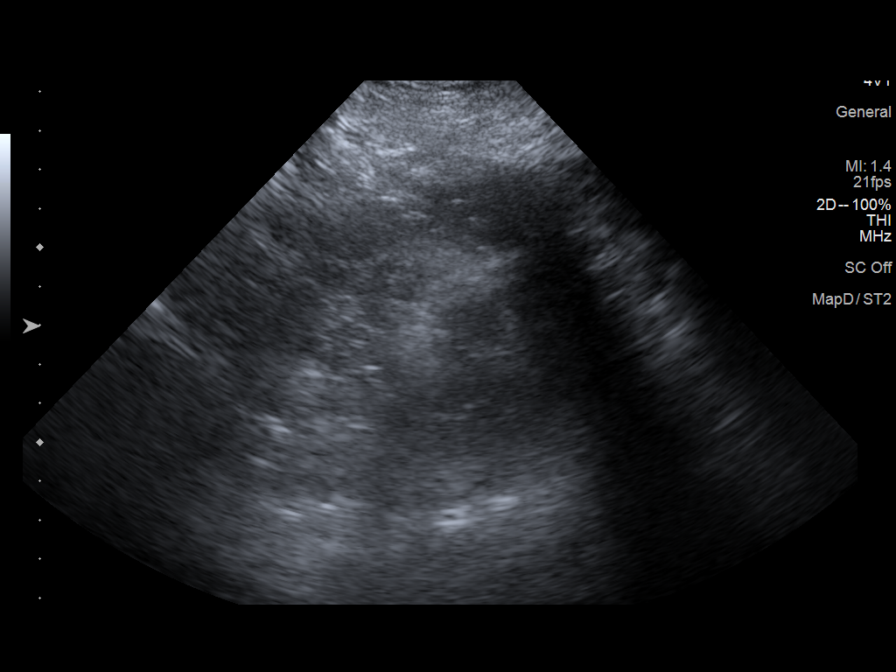
[im 6/8]
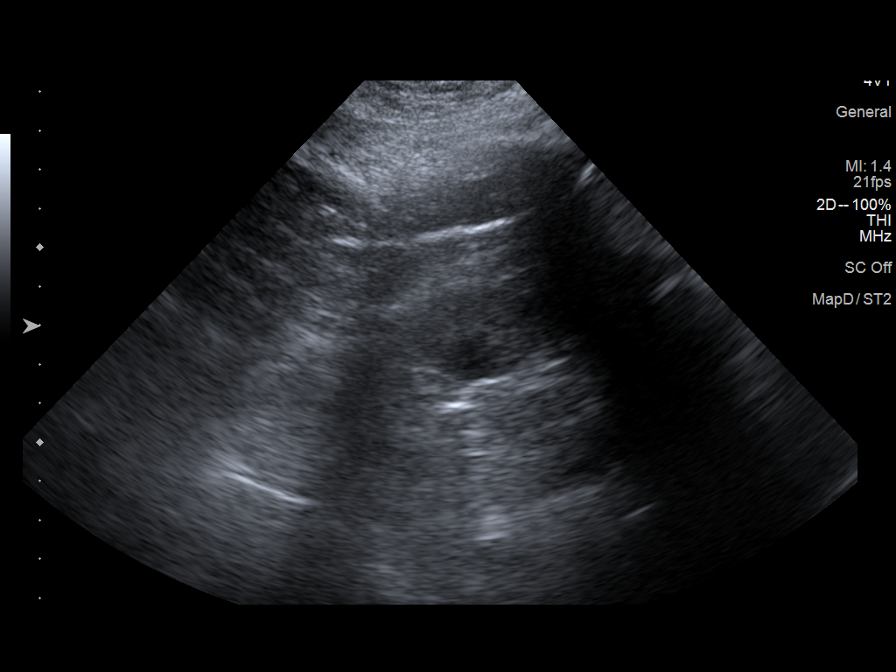
[im 7/8]
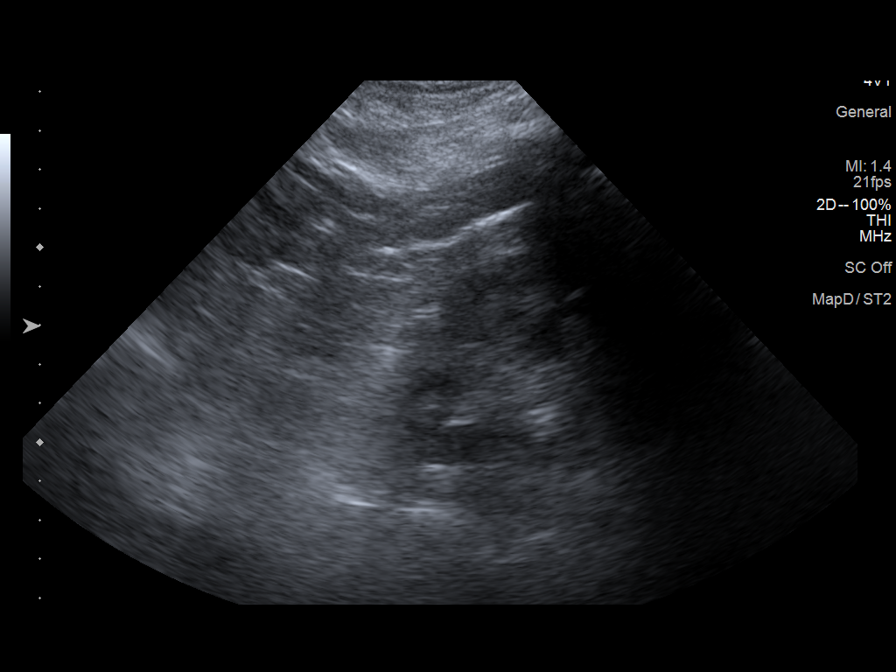
[im 8/8]
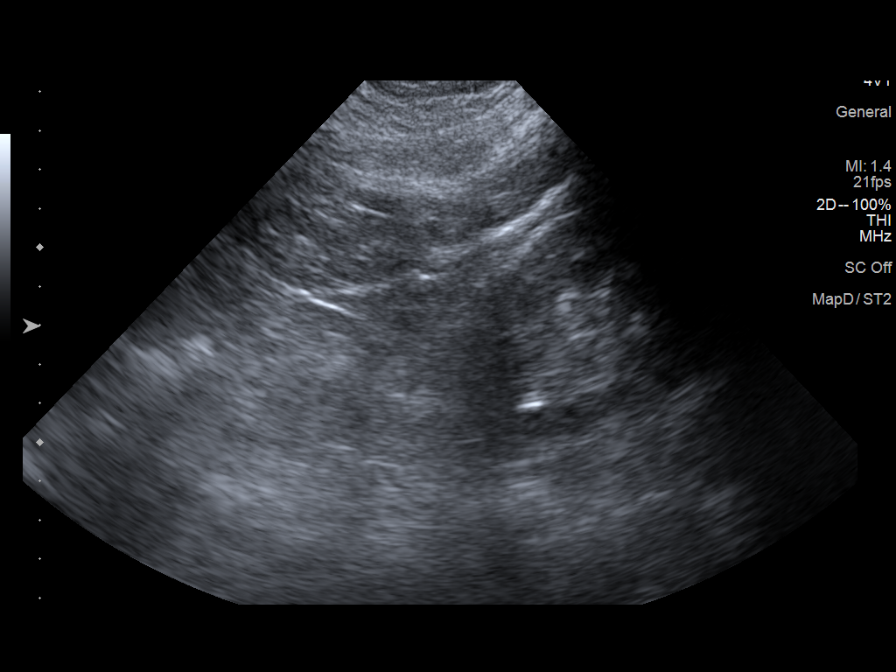

[8 of 8 positions shown; findings below may reference images not displayed]

EXAM:
ULTRASOUND-GUIDED RIGHT RENAL BIOPSY

MEDICATIONS:
None.

ANESTHESIA/SEDATION:
Moderate (conscious) sedation was employed during this procedure. A
total of Versed 2.0 mg and Fentanyl 100 mcg was administered
intravenously.

Moderate Sedation Time: 20 minutes. The patient's level of
consciousness and vital signs were monitored continuously by
radiology nursing throughout the procedure under my direct
supervision.

FLUOROSCOPY TIME:  None

COMPLICATIONS:
None immediate.

PROCEDURE:
Informed written consent was obtained from the patient after a
thorough discussion of the procedural risks, benefits and
alternatives. All questions were addressed. A timeout was performed
prior to the initiation of the procedure.

Both kidneys were evaluated with ultrasound. The right kidney lower
pole was selected for biopsy. Right flank was prepped with
chlorhexidine and sterile field was created. Skin and soft tissues
were anesthetized with 1% lidocaine. 16 gauge core needle was
directed into the right kidney lower pole and core biopsies were
obtained with ultrasound guidance. Total of 3 core biopsies were
obtained. Two of the core biopsies were adequate. Specimens placed
in saline. Bandage placed over the puncture site.
FINDINGS: Core biopsies obtained from the right kidney lower pole. No
significant bleeding or hematoma formation identified after the
biopsy.
IMPRESSION: Ultrasound-guided core biopsy of right kidney lower pole.

## 2019-07-29 ENCOUNTER — Telehealth: Payer: Self-pay

## 2019-07-29 NOTE — Telephone Encounter (Signed)
Larry Clements's daughter Larry Clements left vm stating Larry Clements needs an appt for an ultrasound that Dr. Burr Medico wanted him to have.  Upon review of the chart.  An order was placed for a CT scan abd pelvis in 04/2019 but was never scheduled.  I have schedule this appointment for 08/07/2019 at 1030 with Lab appt for 0930.  I spoke with courtney and she states his "protein" level is increasing and Dr. Lorrene Reid is going to put him on medication for this.  She requested that we get lab results from Dr. Lorrene Reid.

## 2019-08-07 ENCOUNTER — Ambulatory Visit (HOSPITAL_COMMUNITY)
Admission: RE | Admit: 2019-08-07 | Discharge: 2019-08-07 | Disposition: A | Discharge status: Home / Self Care | Payer: BC Managed Care – PPO | Source: Ambulatory Visit | Attending: Hematology | Admitting: Hematology

## 2019-08-07 ENCOUNTER — Other Ambulatory Visit: Payer: Self-pay

## 2019-08-07 ENCOUNTER — Telehealth: Payer: Self-pay

## 2019-08-07 ENCOUNTER — Inpatient Hospital Stay: Payer: BC Managed Care – PPO | Attending: Hematology

## 2019-08-07 DIAGNOSIS — C49A2 Gastrointestinal stromal tumor of stomach: Secondary | ICD-10-CM | POA: Insufficient documentation

## 2019-08-07 DIAGNOSIS — C49A0 Gastrointestinal stromal tumor, unspecified site: Secondary | ICD-10-CM

## 2019-08-07 LAB — CMP (CANCER CENTER ONLY)
ALT: 28 U/L (ref 0–44)
AST: 27 U/L (ref 15–41)
Albumin: 3.9 g/dL (ref 3.5–5.0)
Alkaline Phosphatase: 76 U/L (ref 38–126)
Anion gap: 10 (ref 5–15)
BUN: 35 mg/dL — ABNORMAL HIGH (ref 8–23)
CO2: 25 mmol/L (ref 22–32)
Calcium: 9.6 mg/dL (ref 8.9–10.3)
Chloride: 106 mmol/L (ref 98–111)
Creatinine: 3.16 mg/dL (ref 0.61–1.24)
GFR, Est AFR Am: 23 mL/min — ABNORMAL LOW (ref >60)
GFR, Estimated: 20 mL/min — ABNORMAL LOW (ref >60)
Glucose, Bld: 112 mg/dL — ABNORMAL HIGH (ref 70–99)
Potassium: 4.2 mmol/L (ref 3.5–5.1)
Sodium: 141 mmol/L (ref 135–145)
Total Bilirubin: 0.5 mg/dL (ref 0.3–1.2)
Total Protein: 7 g/dL (ref 6.5–8.1)

## 2019-08-07 LAB — CBC WITH DIFFERENTIAL (CANCER CENTER ONLY)
Abs Immature Granulocytes: 0.02 10*3/uL (ref 0.00–0.07)
Basophils Absolute: 0 10*3/uL (ref 0.0–0.1)
Basophils Relative: 1 %
Eosinophils Absolute: 0.2 10*3/uL (ref 0.0–0.5)
Eosinophils Relative: 3 %
HCT: 37.1 % — ABNORMAL LOW (ref 39.0–52.0)
Hemoglobin: 12.1 g/dL — ABNORMAL LOW (ref 13.0–17.0)
Immature Granulocytes: 0 %
Lymphocytes Relative: 19 %
Lymphs Abs: 0.9 10*3/uL (ref 0.7–4.0)
MCH: 27.8 pg (ref 26.0–34.0)
MCHC: 32.6 g/dL (ref 30.0–36.0)
MCV: 85.1 fL (ref 80.0–100.0)
Monocytes Absolute: 0.5 10*3/uL (ref 0.1–1.0)
Monocytes Relative: 12 %
Neutro Abs: 2.9 10*3/uL (ref 1.7–7.7)
Neutrophils Relative %: 65 %
Platelet Count: 257 10*3/uL (ref 150–400)
RBC: 4.36 MIL/uL (ref 4.22–5.81)
RDW: 13.1 % (ref 11.5–15.5)
WBC Count: 4.5 10*3/uL (ref 4.0–10.5)
nRBC: 0 % (ref 0.0–0.2)

## 2019-08-07 NOTE — Telephone Encounter (Signed)
Critical value:  Creatinine 3.16  Dr. Burr Medico notified

## 2019-08-08 ENCOUNTER — Telehealth: Payer: Self-pay

## 2019-08-08 ENCOUNTER — Telehealth: Payer: Self-pay | Admitting: Hematology

## 2019-08-08 NOTE — Telephone Encounter (Signed)
Dtr calling again re CT scan results. I lvm  Dr Burr Medico will call her.

## 2019-08-08 NOTE — Telephone Encounter (Signed)
Mr. Minahan daughter, Loma Sousa call regarding CT scan result.

## 2019-08-08 NOTE — Telephone Encounter (Signed)
I called pt and her daughter about his recent CT abdomen, which showed no evidence of recurrence of his GIST. He does have multiple renal cysts, radiology recommend f/u with MRI. He has been seen urologist Dr. Junious Silk for his prostate issues, I encouraged him to discuss with Dr. Junious Silk also on his next visit. He voiced good understanding and agrees with the plan.  Larry Clements  08/08/2019

## 2019-08-16 ENCOUNTER — Other Ambulatory Visit: Payer: Self-pay | Admitting: Internal Medicine

## 2019-10-15 NOTE — Progress Notes (Signed)
[place]   [phone] Fax:([phone]) [phone]   Clinic Follow up Note   Patient Care Team: Baxley, Cresenciano Lick, MD as PCP - General (Internal Medicine) Festus Aloe, MD as Consulting Physician (Urology) Jamal Maes, MD as Consulting Physician (Nephrology) Truitt Merle, MD as Consulting Physician (Hematology)  Date of Service:  10/17/2019  CHIEF COMPLAINT: F/u of GIST of Stomach  SUMMARY OF ONCOLOGIC HISTORY: Oncology History Overview Note  Cancer Staging 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[place]) Staging form: Gastrointestinal Stromal Tumor - Gastric and Omental GIST, AJCC 8th Edition - Pathologic stage from 10/26/2016: Stage IIIB (pT4, pN0, cM0, Mitotic Rate: High) - Signed by Truitt Merle, MD on 01/20/2017     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[place])  08/18/2016 Imaging   Abdominal MRI with and without contrast showed a 10.3 cm mass in the left upper quadrant at adjacent to the gastric fundus   10/26/2016 Initial Diagnosis   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[place])   10/26/2016 Surgery   laparoscopic partial gastrectomy by Dr. 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 (+)   10/26/2016 Initial Diagnosis   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[place])   10/26/2016 Pathology Results   Stomach, resection for tumor, Greater curve - GASTROINTESTINAL STROMAL TUMOR, SPINDLE CELL TYPE, 10.5 CM - MARGINS UNINVOLVED BY TUMOR  Microscopic Comment GASTROINTESTINAL STROMAL TUMOR (GIST): Procedure: Resection, partial gastrectomy Tumor Site: Not specified Tumor Size: 10.5 cm Tumor Focality: Unifocal Histologic Type: Gastrointestinal stromal tumor, spindle cell type Mitotic Rate:  13 /50 HIGH POWER FIELD Necrosis: Not identified Histologic Grade: G2 (High grade; mitotic rate >5/50 HIGH POWER FIELD) Risk Assessment: High Margins: Uninvolved by GIST      10/26/2016 Initial Diagnosis   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[place])   12/27/2016 - 04/18/2019 Chemotherapy   Adjuvant imatinib (Gleevec) 400 mg daily for 3 years started on 12/27/2016. Stopped 04/18/19 due to Pt concerns of worsening kidney function.     10/26/2017 Surgery   Laparoscopic partial gastrostomy by Dr. Barry Dienes      CURRENT THERAPY:  Surveillance   INTERVAL HISTORY:  Larry Clements is here for a follow up of GIST. He was last seen by me 6 months ago. He presents to the clinic alone. His daughter was called to be included in the visit. He notes he is doing well without any pain or bloating. He notes he is eating well and have adequate energy. He notes he is able to go to [place]. He continues to f/u with new nephrologist and was seen last month. I reviewed his medication list with him. He notes his BP is high initially and if repeated will come down to 140 range. He notes he checks his BP at home and is in 140 range.  He denies feeling any changes since stopping Gleevec. His daughter notes his proteinuria off Gleevec has increased. He is now on medication to manage his proteinuria.     REVIEW OF SYSTEMS:   Constitutional: Denies fevers, chills or abnormal weight loss Eyes: Denies blurriness of vision Ears, nose, mouth, throat, and face: Denies mucositis or sore throat Respiratory: Denies cough, dyspnea or wheezes Cardiovascular: Denies palpitation, chest discomfort or lower extremity swelling Gastrointestinal:  Denies nausea, heartburn or change in  bowel habits Skin: Denies abnormal skin rashes Lymphatics: Denies new lymphadenopathy or easy bruising Neurological:Denies numbness, tingling or new weaknesses Behavioral/Psych: Mood is stable, no new changes  All other systems  were reviewed with the patient and are negative.  MEDICAL HISTORY:  Past Medical History:  Diagnosis Date  . Chronic kidney disease, stage III (moderate)   . Gastrointestinal stromal tumor (GIST) ([place])   . Hypertension   . 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 PLA2R staining +. 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 COLONOSCOPY    . EUS N/A 09/21/2016   Procedure: UPPER ENDOSCOPIC ULTRASOUND (EUS) LINEAR;  Surgeon: Milus Banister, MD;  Location: [facility];  Service: Endoscopy;  Laterality: N/A;  . LAPAROSCOPIC GASTRECTOMY N/A 10/26/2016   Procedure: LAPAROSCOPIC PARTIAL GASTRECTOMY;  Surgeon: Stark Klein, MD;  Location: [facility];  Service: General;  Laterality: N/A;  . LAPAROSCOPIC PARTIAL GASTRECTOMY  10/26/2016  . SURGICAL EXCISION OF EXCESSIVE SKIN     fatty tissue    I have reviewed the social history and family history with the patient and they are unchanged from previous note.  ALLERGIES:  is allergic to no known allergies.  MEDICATIONS:  Current Outpatient Medications  Medication Sig Dispense Refill  . amLODipine (NORVASC) 10 MG tablet TAKE 1 TABLET BY MOUTH EVERY DAY 90 tablet 3  . calcitRIOL (ROCALTROL) 0.5 MCG capsule Take 0.5 mcg by mouth daily.    . furosemide (LASIX) 40 MG tablet TAKE 1 TABLET (40 MG TOTAL) BY MOUTH DAILY. 90 tablet 0  . hydrALAZINE (APRESOLINE) 50 MG tablet Take 50 mg by mouth 3 (three) times daily.    Marland Kitchen KLOR-CON M20 20 MEQ tablet TAKE 2 TABLETS BY MOUTH EVERY DAY 180 tablet 5  . labetalol (NORMODYNE) 300 MG tablet Take 300 mg by mouth every 8 (eight) hours.  5  . losartan (COZAAR) 100 MG tablet TAKE 1 TABLET BY MOUTH EVERY DAY 90 tablet 0   No current facility-administered medications for this visit.    PHYSICAL EXAMINATION: ECOG PERFORMANCE STATUS: 0 -  Asymptomatic  Vitals:   10/17/19 0942  BP: (!) 160/86  Pulse: 85  Resp: 18  Temp: 97.8 F (36.6 C)  SpO2: 99%   Filed Weights   10/17/19 0942  Weight: 261 lb 6.4 oz (118.6 kg)    GENERAL:alert, no distress and comfortable SKIN: skin color, texture, turgor are normal, no rashes or significant lesions EYES: normal, Conjunctiva are pink and non-injected, sclera clear  NECK: supple, thyroid normal size, non-tender, without nodularity LYMPH:  no palpable lymphadenopathy in the cervical, axillary  LUNGS: clear to auscultation and percussion with normal breathing effort HEART: regular rate & rhythm and no murmurs and no lower extremity edema ABDOMEN:abdomen soft, non-tender and normal bowel sounds  Musculoskeletal:no cyanosis of digits and no clubbing  NEURO: alert & oriented x 3 with fluent speech, no focal motor/sensory deficits  LABORATORY DATA:  I have reviewed the data as listed CBC Latest Ref Rng & Units 10/17/2019 08/07/2019 04/18/2019  WBC 4.0 - 10.5 K/uL 5.3 4.5 5.5  Hemoglobin 13.0 - 17.0 g/dL 11.2(L) 12.1(L) 10.6(L)  Hematocrit 39.0 - 52.0 % 35.4(L) 37.1(L) 33.6(L)  Platelets 150 - 400 K/uL 249 257 192     CMP Latest Ref Rng & Units 10/17/2019 08/07/2019 04/18/2019  Glucose 70 - 99 mg/dL 119(H) 112(H) 118(H)  BUN 8 - 23  mg/dL 30(H) 35(H) 29(H)  Creatinine 0.61 - 1.24 mg/dL 3.07(HH) 3.16(HH) 3.12(HH)  Sodium 135 - 145 mmol/L 142 141 138  Potassium 3.5 - 5.1 mmol/L 4.7 4.2 5.1  Chloride 98 - 111 mmol/L 106 106 105  CO2 22 - 32 mmol/L _0 Calcium 8.9 - 10.3 mg/dL 9.3 9.6 8.5(L)  Total Protein 6.5 - 8.1 g/dL 6.7 7.0 6.6  Total Bilirubin 0.3 - 1.2 mg/dL 0.3 0.5 0.6  Alkaline Phos 38 - 126 U/L 82 76 64  AST 15 - 41 U/L _1 ALT 0 - 44 U/L _2 RADIOGRAPHIC STUDIES: I have personally reviewed the radiological images as listed and agreed with the findings in the report. No results found.   ASSESSMENT & PLAN:  Larry Clements is a 64 y.o. male  with    1. 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 -He was diagnosed in 10/2016. He is s/p partial gastrectomy forcompleteGIST removal.  -Per NCCN counseling, giving the size of the tumor, and high mitotic rate, he is at very high risk for recurrence (86%).  -His tumor ckit mutation was positive, which predicts good benefit of adjuvant imatinib  -He startedstandard care with adjuvant Imatinib 400 mg daily for 3 years starting 12/27/16, well tolerated. He has not been compliant with his follow up. Given his daughter's high concern with worsening kidney function he opted to stop Gleevec on 04/18/19.  -He is clinically doing well with no major changes since stopping Gleevec. His daughter notes his Proteinuria flared after stopping Gleevec and is now managed on medication. Physical exam unremarkable. Labs reviewed, CBC and CMP WNL except Hg 11.2.  -We discussed his CT AP from 08/07/19 is stable with no new or progressive findings.  -I discussed continuing with surveillance. I encouraged him to watch bloating, nausea or abdominal pain.  -F/u in 6 months will order next surveillance scan at that time.    2. Chronic kidney disease, stage IV -Will continuef/u hisnephrologist Dr Lorrene Reid -He underwent biopsy in January 2019 -We previously reviewed his lab since 2016. He has had CKD for more than 4 years, and it has been gradually getting worse over time.  -His daughter notes his recent Cr has increased to 2.8 range which indicates Stage IV CKD. She is concerned his worsening kidney function is related to [place].  -I discussed although Gleevec rarely cause renal insufficiency unless pt has severe diarrhea from it, his long standing HTN appears to be the main cause for his CKD.  -We stopped Gleevec per his request in 04/2019.   3. Hypertension  -Patient on Norvasc,losartan, lasix, and hydralazinecurrently. -Has been better controlled  lately but still elevated in 140-150. I previously discussed goal is 130/85 or below.  -I encouraged him to reduce salt intake.  -Continue to f/u with his PCP   4. Bilateral kidney cysts -Found onpriorimaging study,has remained stable on prior images.   5. Elevated PSA -He will continue to f/u with Urologist Dr. Donell Sievert  -His 04/2019 MRI prostate elevated at 11 -Per request I will check PSA today and next visit.    Plan: -PSA lab today (per pt's daughter's request), and will forward the results to his urologist -Lab and f/u in 6 months    No problem-specific Assessment & Plan notes found for this encounter.   Orders Placed This Encounter  Procedures  . Prostate-Specific AG, Serum    Standing  Status:   Standing    Number of Occurrences:   2    Standing Expiration Date:   10/16/2020   All questions were answered. The patient knows to call the clinic with any problems, questions or concerns. No barriers to learning was detected. The total time spent in the appointment was 25 minutes.     Truitt Merle, MD 10/17/2019   I, Joslyn Devon, am acting as scribe for Truitt Merle, MD.   I have reviewed the above documentation for accuracy and completeness, and I agree with the above.

## 2019-10-17 ENCOUNTER — Inpatient Hospital Stay: Payer: BC Managed Care – PPO

## 2019-10-17 ENCOUNTER — Encounter: Payer: Self-pay | Admitting: Hematology

## 2019-10-17 ENCOUNTER — Inpatient Hospital Stay: Payer: BC Managed Care – PPO | Attending: Hematology | Admitting: Hematology

## 2019-10-17 ENCOUNTER — Other Ambulatory Visit: Payer: Self-pay

## 2019-10-17 VITALS — BP 160/86 | HR 85 | Temp 97.8°F | Resp 18 | Ht 76.0 in | Wt 261.4 lb

## 2019-10-17 DIAGNOSIS — Z9221 Personal history of antineoplastic chemotherapy: Secondary | ICD-10-CM | POA: Diagnosis not present

## 2019-10-17 DIAGNOSIS — Z79899 Other long term (current) drug therapy: Secondary | ICD-10-CM | POA: Insufficient documentation

## 2019-10-17 DIAGNOSIS — I129 Hypertensive chronic kidney disease with stage 1 through stage 4 chronic kidney disease, or unspecified chronic kidney disease: Secondary | ICD-10-CM | POA: Insufficient documentation

## 2019-10-17 DIAGNOSIS — N281 Cyst of kidney, acquired: Secondary | ICD-10-CM | POA: Diagnosis not present

## 2019-10-17 DIAGNOSIS — R972 Elevated prostate specific antigen [PSA]: Secondary | ICD-10-CM | POA: Insufficient documentation

## 2019-10-17 DIAGNOSIS — N184 Chronic kidney disease, stage 4 (severe): Secondary | ICD-10-CM | POA: Insufficient documentation

## 2019-10-17 DIAGNOSIS — I10 Essential (primary) hypertension: Secondary | ICD-10-CM

## 2019-10-17 DIAGNOSIS — R809 Proteinuria, unspecified: Secondary | ICD-10-CM | POA: Insufficient documentation

## 2019-10-17 DIAGNOSIS — N1832 Chronic kidney disease, stage 3b: Secondary | ICD-10-CM

## 2019-10-17 DIAGNOSIS — C49A2 Gastrointestinal stromal tumor of stomach: Secondary | ICD-10-CM

## 2019-10-17 DIAGNOSIS — C49A0 Gastrointestinal stromal tumor, unspecified site: Secondary | ICD-10-CM

## 2019-10-17 LAB — CBC WITH DIFFERENTIAL (CANCER CENTER ONLY)
Abs Immature Granulocytes: 0.02 10*3/uL (ref 0.00–0.07)
Basophils Absolute: 0 10*3/uL (ref 0.0–0.1)
Basophils Relative: 1 %
Eosinophils Absolute: 0.1 10*3/uL (ref 0.0–0.5)
Eosinophils Relative: 3 %
HCT: 35.4 % — ABNORMAL LOW (ref 39.0–52.0)
Hemoglobin: 11.2 g/dL — ABNORMAL LOW (ref 13.0–17.0)
Immature Granulocytes: 0 %
Lymphocytes Relative: 15 %
Lymphs Abs: 0.8 10*3/uL (ref 0.7–4.0)
MCH: 27.5 pg (ref 26.0–34.0)
MCHC: 31.6 g/dL (ref 30.0–36.0)
MCV: 86.8 fL (ref 80.0–100.0)
Monocytes Absolute: 0.6 10*3/uL (ref 0.1–1.0)
Monocytes Relative: 11 %
Neutro Abs: 3.7 10*3/uL (ref 1.7–7.7)
Neutrophils Relative %: 70 %
Platelet Count: 249 10*3/uL (ref 150–400)
RBC: 4.08 MIL/uL — ABNORMAL LOW (ref 4.22–5.81)
RDW: 12.5 % (ref 11.5–15.5)
WBC Count: 5.3 10*3/uL (ref 4.0–10.5)
nRBC: 0 % (ref 0.0–0.2)

## 2019-10-17 LAB — CMP (CANCER CENTER ONLY)
ALT: 19 U/L (ref 0–44)
AST: 23 U/L (ref 15–41)
Albumin: 3.6 g/dL (ref 3.5–5.0)
Alkaline Phosphatase: 82 U/L (ref 38–126)
Anion gap: 12 (ref 5–15)
BUN: 30 mg/dL — ABNORMAL HIGH (ref 8–23)
CO2: 24 mmol/L (ref 22–32)
Calcium: 9.3 mg/dL (ref 8.9–10.3)
Chloride: 106 mmol/L (ref 98–111)
Creatinine: 3.07 mg/dL (ref 0.61–1.24)
GFR, Est AFR Am: 24 mL/min — ABNORMAL LOW (ref >60)
GFR, Estimated: 21 mL/min — ABNORMAL LOW (ref >60)
Glucose, Bld: 119 mg/dL — ABNORMAL HIGH (ref 70–99)
Potassium: 4.7 mmol/L (ref 3.5–5.1)
Sodium: 142 mmol/L (ref 135–145)
Total Bilirubin: 0.3 mg/dL (ref 0.3–1.2)
Total Protein: 6.7 g/dL (ref 6.5–8.1)

## 2019-10-17 NOTE — Progress Notes (Signed)
CRITICAL VALUE STICKER  CRITICAL VALUE: creatinine 3.07  RECEIVER (on-site recipient of call): Manuela Schwartz, [place] NOTIFIED: 10/17/19 @ 10:13  MESSENGER (representative from lab):Wyvonnia Dusky  MD NOTIFIED: Dr. Burr Medico  TIME OF NOTIFICATION:1014  RESPONSE: MD seeing patient

## 2019-10-18 LAB — PROSTATE-SPECIFIC AG, SERUM (LABCORP): Prostate Specific Ag, Serum: 5.8 ng/mL — ABNORMAL HIGH (ref 0.0–4.0)

## 2019-10-20 ENCOUNTER — Telehealth: Payer: Self-pay | Admitting: Hematology

## 2019-10-20 ENCOUNTER — Other Ambulatory Visit: Payer: Self-pay | Admitting: Internal Medicine

## 2019-10-20 NOTE — Telephone Encounter (Signed)
Scheduled appt per 6/4 los.  Spoke with pt and he is aware of the appt date and time.

## 2019-10-21 ENCOUNTER — Telehealth: Payer: Self-pay

## 2019-10-21 NOTE — Telephone Encounter (Signed)
Please ask the pharmacist to contact [facility] as we have not seen him in some time. You can look up the name of this nephrologist to give them under media

## 2019-10-21 NOTE — Telephone Encounter (Signed)
MR Linnen's dughter Caryl Pina called regarding lab results.  I reviewed Dr. Ernestina Penna comments and recommondations.  Ms Eisenberger verbalized understanding. PSA  result faxed via epic to Dr. Junious Silk.

## 2019-10-21 NOTE — Telephone Encounter (Signed)
-----   Message from Truitt Merle, MD sent at 10/21/2019 10:34 AM EDT ----- Please let pt's daughter know the PSA result, and forward to his urologist (indicating the test was requested by his daughter) for them to follow up, thanks   Truitt Merle

## 2020-01-26 ENCOUNTER — Other Ambulatory Visit: Payer: Self-pay | Admitting: Internal Medicine

## 2020-04-19 ENCOUNTER — Inpatient Hospital Stay: Payer: BC Managed Care – PPO

## 2020-04-19 ENCOUNTER — Inpatient Hospital Stay: Payer: BC Managed Care – PPO | Attending: Hematology | Admitting: Hematology

## 2020-04-19 DIAGNOSIS — N1832 Chronic kidney disease, stage 3b: Secondary | ICD-10-CM

## 2020-04-19 DIAGNOSIS — C49A2 Gastrointestinal stromal tumor of stomach: Secondary | ICD-10-CM

## 2020-04-19 DIAGNOSIS — I10 Essential (primary) hypertension: Secondary | ICD-10-CM

## 2020-06-22 ENCOUNTER — Encounter (HOSPITAL_COMMUNITY): Payer: Self-pay

## 2020-06-22 ENCOUNTER — Other Ambulatory Visit: Payer: Self-pay

## 2020-06-22 DIAGNOSIS — Z85028 Personal history of other malignant neoplasm of stomach: Secondary | ICD-10-CM | POA: Diagnosis not present

## 2020-06-22 DIAGNOSIS — Z79899 Other long term (current) drug therapy: Secondary | ICD-10-CM | POA: Insufficient documentation

## 2020-06-22 DIAGNOSIS — I129 Hypertensive chronic kidney disease with stage 1 through stage 4 chronic kidney disease, or unspecified chronic kidney disease: Secondary | ICD-10-CM | POA: Insufficient documentation

## 2020-06-22 DIAGNOSIS — N183 Chronic kidney disease, stage 3 unspecified: Secondary | ICD-10-CM | POA: Diagnosis not present

## 2020-06-22 NOTE — ED Triage Notes (Signed)
Patient arrived stating he went to the dentist this morning and told his blood pressure is running high. Patient declines any symptoms at this time.  Patient on multiple blood pressure medications states he took all prescribed today.

## 2020-06-23 ENCOUNTER — Telehealth: Payer: Self-pay | Admitting: Internal Medicine

## 2020-06-23 ENCOUNTER — Emergency Department (HOSPITAL_COMMUNITY)
Admission: EM | Admit: 2020-06-23 | Discharge: 2020-06-23 | Disposition: A | Discharge status: Home / Self Care | Payer: BC Managed Care – PPO | Attending: Emergency Medicine | Admitting: Emergency Medicine

## 2020-06-23 DIAGNOSIS — I10 Essential (primary) hypertension: Secondary | ICD-10-CM

## 2020-06-23 LAB — BASIC METABOLIC PANEL
Anion gap: 11 (ref 5–15)
BUN: 21 mg/dL (ref 8–23)
CO2: 22 mmol/L (ref 22–32)
Calcium: 9.1 mg/dL (ref 8.9–10.3)
Chloride: 104 mmol/L (ref 98–111)
Creatinine, Ser: 3.37 mg/dL — ABNORMAL HIGH (ref 0.61–1.24)
GFR, Estimated: 20 mL/min — ABNORMAL LOW (ref >60)
Glucose, Bld: 124 mg/dL — ABNORMAL HIGH (ref 70–99)
Potassium: 3.7 mmol/L (ref 3.5–5.1)
Sodium: 137 mmol/L (ref 135–145)

## 2020-06-23 MED ORDER — LABETALOL HCL 5 MG/ML IV SOLN
20.0000 mg | Freq: Once | INTRAVENOUS | Status: AC
  Administered 2020-06-23: 20 mg via INTRAVENOUS
  Filled 2020-06-23: qty 4

## 2020-06-23 MED ORDER — HYDRALAZINE HCL 25 MG PO TABS
50.0000 mg | ORAL_TABLET | Freq: Once | ORAL | Status: AC
  Administered 2020-06-23: 50 mg via ORAL
  Filled 2020-06-23: qty 2

## 2020-06-23 NOTE — ED Notes (Signed)
Pt BP incresed, Pt states no symptoms. RN Notified

## 2020-06-23 NOTE — Telephone Encounter (Signed)
Faxed Ed notes to [facility] per Dr Verlene Mayer request since we have not seen patient since 2019

## 2020-06-23 NOTE — ED Provider Notes (Signed)
[facility] DEPT Provider Note   CSN: [id number] Arrival date & time: 06/22/20  2320     History Chief Complaint  Patient presents with  . Hypertension    Larry Clements is a 65 y.o. male.  Patient presents to the emergency department for evaluation of elevated blood pressure.  Patient went to the dentist today and was told his blood pressure was high.  He was unaware, has not had any symptoms.  He denies blurred vision, headache, chest pain, heart palpitations, shortness of breath.  He is on multiple medications, reports that he has taken all of his medicines today.        Past Medical History:  Diagnosis Date  . Chronic kidney disease, stage III (moderate) ([facility])   . Gastrointestinal stromal tumor (GIST) ([place])   . Hypertension   . 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 PLA2R staining +. Mod to severe arterionephrosclerosis, mod TI scarring, secondary focal and segmental tuft sclerosis    Patient Active Problem List   Diagnosis Date Noted  . Chronic kidney disease, stage III (moderate) ([place]) 07/11/2017  . 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[place]) 10/26/2016  . Hypokalemia 06/15/2013  . Hypertension 03/14/2012    Past Surgical History:  Procedure Laterality Date  . COLONOSCOPY    . EUS N/A 09/21/2016   Procedure: UPPER ENDOSCOPIC ULTRASOUND (EUS) LINEAR;  Surgeon: Milus Banister, MD;  Location: [facility];  Service: Endoscopy;  Laterality: N/A;  . LAPAROSCOPIC GASTRECTOMY N/A 10/26/2016   Procedure: LAPAROSCOPIC PARTIAL GASTRECTOMY;  Surgeon: Stark Klein, MD;  Location: [facility];  Service: General;  Laterality: N/A;  . LAPAROSCOPIC PARTIAL GASTRECTOMY  10/26/2016  . SURGICAL EXCISION OF EXCESSIVE SKIN     fatty tissue       Family History  Problem Relation Age of Onset  . Diabetes Father   . Kidney failure Father    . Colon cancer Father   . Hypertension Mother     Social History   Tobacco Use  . Smoking status: Never Smoker  . Smokeless tobacco: Never Used  Vaping Use  . Vaping Use: Never used  Substance Use Topics  . Alcohol use: No    Alcohol/week: 0.0 standard drinks  . Drug use: No    Home Medications Prior to Admission medications   Medication Sig Start Date End Date Taking? Authorizing Provider  amLODipine (NORVASC) 10 MG tablet TAKE 1 TABLET BY MOUTH EVERY DAY 01/30/19   Elby Showers, MD  calcitRIOL (ROCALTROL) 0.5 MCG capsule Take 0.5 mcg by mouth daily. 03/17/19   [provider]  furosemide (LASIX) 40 MG tablet TAKE 1 TABLET (40 MG TOTAL) BY MOUTH DAILY. 09/03/16   Elby Showers, MD  hydrALAZINE (APRESOLINE) 50 MG tablet Take 50 mg by mouth 3 (three) times daily.    [provider]  KLOR-CON M20 20 MEQ tablet TAKE 2 TABLETS BY MOUTH EVERY DAY 03/19/18   Elby Showers, MD  labetalol (NORMODYNE) 300 MG tablet Take 300 mg by mouth every 8 (eight) hours. 08/28/16   [provider]  losartan (COZAAR) 100 MG tablet TAKE 1 TABLET BY MOUTH EVERY DAY 07/01/19   Elby Showers, MD    Allergies    No known allergies  Review of Systems   Review of Systems  Eyes: Negative for visual disturbance.  Respiratory: Negative for shortness of breath.   Cardiovascular: Negative for chest  pain.  Neurological: Negative for headaches.  All other systems reviewed and are negative.   Physical Exam Updated Vital Signs BP (!) 196/98   Pulse 83   Temp 98.3 F (36.8 C) (Oral)   Resp 19   Ht 6\' 4"  (1.93 m)   Wt 117.9 kg   SpO2 96%   BMI 31.65 kg/m   Physical Exam Vitals and nursing note reviewed.  Constitutional:      General: He is not in acute distress.    Appearance: Normal appearance. He is well-developed and well-nourished.  HENT:     Head: Normocephalic and atraumatic.     Right Ear: Hearing normal.     Left Ear: Hearing normal.     Nose: Nose normal.      Mouth/Throat:     Mouth: Oropharynx is clear and moist and mucous membranes are normal.  Eyes:     Extraocular Movements: EOM normal.     Conjunctiva/sclera: Conjunctivae normal.     Pupils: Pupils are equal, round, and reactive to light.  Cardiovascular:     Rate and Rhythm: Regular rhythm.     Heart sounds: S1 normal and S2 normal. No murmur heard. No friction rub. No gallop.   Pulmonary:     Effort: Pulmonary effort is normal. No respiratory distress.     Breath sounds: Normal breath sounds.  Chest:     Chest wall: No tenderness.  Abdominal:     General: Bowel sounds are normal.     Palpations: Abdomen is soft. There is no hepatosplenomegaly.     Tenderness: There is no abdominal tenderness. There is no guarding or rebound. Negative signs include Murphy's sign and McBurney's sign.     Hernia: No hernia is present.  Musculoskeletal:        General: Normal range of motion.     Cervical back: Normal range of motion and neck supple.  Skin:    General: Skin is warm, dry and intact.     Findings: No rash.     Nails: There is no cyanosis.  Neurological:     Mental Status: He is alert and oriented to person, place, and time.     GCS: GCS eye subscore is 4. GCS verbal subscore is 5. GCS motor subscore is 6.     Cranial Nerves: No cranial nerve deficit.     Sensory: No sensory deficit.     Coordination: Coordination normal.     Deep Tendon Reflexes: Strength normal.  Psychiatric:        Mood and Affect: Mood and affect normal.        Speech: Speech normal.        Behavior: Behavior normal.        Thought Content: Thought content normal.     ED Results / Procedures / Treatments   Labs (all labs ordered are listed, but only abnormal results are displayed) Labs Reviewed  BASIC METABOLIC PANEL - Abnormal; Notable for the following components:      Result Value   Glucose, Bld 124 (*)    Creatinine, Ser 3.37 (*)    GFR, Estimated 20 (*)    All other components within normal  limits    EKG EKG Interpretation  Date/Time:  Wednesday June 23 2020 04:51:44 EST Ventricular Rate:  69 PR Interval:    QRS Duration: 87 QT Interval:  440 QTC Calculation: 472 R Axis:   39 Text Interpretation: Sinus rhythm Borderline T wave abnormalities Minimal ST elevation, anterior leads  12 Lead; [place] Confirmed by Orpah Greek [phone]) on 06/23/2020 5:03:13 AM   Radiology No results found.  Procedures Procedures   Medications Ordered in ED Medications  labetalol (NORMODYNE) injection 20 mg (20 mg Intravenous Given 06/23/20 0444)  hydrALAZINE (APRESOLINE) tablet 50 mg (50 mg Oral Given 06/23/20 0443)    ED Course  I have reviewed the triage vital signs and the nursing notes.  Pertinent labs & imaging results that were available during my care of the patient were reviewed by me and considered in my medical decision making (see chart for details).    MDM Rules/Calculators/A&P                          Patient appears well.  He is without symptoms.  Neurologic exam unremarkable.  Patient appears to have a history of fairly difficult to manage blood pressure.  Patient with history of stage III chronic kidney disease, creatinine in the low threes, around his baseline.  Administered labetalol and hydralazine with improvement of his blood pressure.  Will take his morning medications as scheduled and follow-up with primary care.  Final Clinical Impression(s) / ED Diagnoses Final diagnoses:  Hypertension, unspecified type    Rx / DC Orders ED Discharge Orders    None       Orpah Greek, MD 06/23/20 [phone]

## 2020-06-24 ENCOUNTER — Telehealth: Payer: Self-pay

## 2020-06-24 NOTE — Telephone Encounter (Signed)
Transition Care Management Follow-up Telephone Call  Date of discharge and from where: 06/23/20 from   How have you been since you were released from the hospital? Pt states that he is feeling much better today and has not had any issues.   Any questions or concerns? No  Items Reviewed:  Did the pt receive and understand the discharge instructions provided? Yes   Medications obtained and verified? No   Other? No   Any new allergies since your discharge? No   Dietary orders reviewed? N/A  Do you have support at home? Yes   Functional Questionnaire: (I = Independent and D = Dependent) ADLs: I  Bathing/Dressing- I  Meal Prep- I  Eating- I  Maintaining continence- I  Transferring/Ambulation- I  Managing Meds- I  Follow up appointments reviewed:   [facility] f/u appt confirmed? No  Patient is followed by oncology  Are transportation arrangements needed? No   If their condition worsens, is the pt aware to call PCP or go to the Emergency Dept.? Yes  Was the patient provided with contact information for the PCP's office or ED? Yes  Was to pt encouraged to call back with questions or concerns? Yes

## 2020-06-24 NOTE — Telephone Encounter (Signed)
Transition Care Management Unsuccessful Follow-up Telephone Call  Date of discharge and from where:  06/23/20 from [facility].   Attempts:  1st Attempt  Reason for unsuccessful TCM follow-up call:  Left voice message

## 2020-11-19 ENCOUNTER — Emergency Department (HOSPITAL_COMMUNITY): Payer: BC Managed Care – PPO

## 2020-11-19 ENCOUNTER — Other Ambulatory Visit: Payer: Self-pay

## 2020-11-19 ENCOUNTER — Encounter (HOSPITAL_COMMUNITY): Payer: Self-pay | Admitting: *Deleted

## 2020-11-19 ENCOUNTER — Emergency Department (HOSPITAL_COMMUNITY)
Admission: EM | Admit: 2020-11-19 | Discharge: 2020-11-19 | Disposition: A | Discharge status: Home / Self Care | Payer: BC Managed Care – PPO | Attending: Emergency Medicine | Admitting: Emergency Medicine

## 2020-11-19 DIAGNOSIS — N183 Chronic kidney disease, stage 3 unspecified: Secondary | ICD-10-CM | POA: Insufficient documentation

## 2020-11-19 DIAGNOSIS — S4992XA Unspecified injury of left shoulder and upper arm, initial encounter: Secondary | ICD-10-CM | POA: Diagnosis present

## 2020-11-19 DIAGNOSIS — S43015A Anterior dislocation of left humerus, initial encounter: Secondary | ICD-10-CM | POA: Diagnosis not present

## 2020-11-19 DIAGNOSIS — W010XXA Fall on same level from slipping, tripping and stumbling without subsequent striking against object, initial encounter: Secondary | ICD-10-CM | POA: Insufficient documentation

## 2020-11-19 DIAGNOSIS — I129 Hypertensive chronic kidney disease with stage 1 through stage 4 chronic kidney disease, or unspecified chronic kidney disease: Secondary | ICD-10-CM | POA: Insufficient documentation

## 2020-11-19 DIAGNOSIS — Z79899 Other long term (current) drug therapy: Secondary | ICD-10-CM | POA: Diagnosis not present

## 2020-11-19 DIAGNOSIS — S43005A Unspecified dislocation of left shoulder joint, initial encounter: Secondary | ICD-10-CM

## 2020-11-19 MED ORDER — PROPOFOL 10 MG/ML IV BOLUS
1.0000 mg/kg | Freq: Once | INTRAVENOUS | Status: AC
  Administered 2020-11-19: 250 mg via INTRAVENOUS
  Filled 2020-11-19: qty 20

## 2020-11-19 MED ORDER — OXYCODONE-ACETAMINOPHEN 5-325 MG PO TABS: 1.0000 | ORAL_TABLET | Freq: Four times a day (QID) | ORAL | 0 refills | Status: DC | PRN

## 2020-11-19 MED ORDER — FENTANYL CITRATE (PF) 100 MCG/2ML IJ SOLN
50.0000 ug | INTRAMUSCULAR | Status: AC | PRN
Start: 2020-11-19 — End: 2020-11-19
  Administered 2020-11-19 (×2): 50 ug via INTRAVENOUS
  Filled 2020-11-19 (×2): qty 2

## 2020-11-19 NOTE — ED Triage Notes (Signed)
Pt complains of left shoulder pain since fall this morning. He slipped and fell. No loss of consciousness or head injury.

## 2020-11-19 NOTE — ED Provider Notes (Signed)
[facility] DEPT Provider Note   CSN: [id number] Arrival date & time: 11/19/20  0841     History Chief Complaint  Patient presents with   Shoulder Pain    Larry Clements is a 65 y.o. male who presents to the ED today with complaint of sudden onset, constant, severe, left shoulder pain s/p fall that occurred earlier this morning around 8 AM. Pt states that he slipped and fell, landing onto his shoulder. No head injury or LOC. He has been unable to move the shoulder since then. Denies any previous trauma or dislocations to the shoulder. No other complaints at this time.   The history is provided by the patient and medical records.      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 PLA2R staining +. 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[place]) 10/26/2016   Hypokalemia 06/15/2013   Hypertension 03/14/2012    Past Surgical History:  Procedure Laterality Date   COLONOSCOPY     EUS N/A 09/21/2016   Procedure: UPPER ENDOSCOPIC ULTRASOUND (EUS) LINEAR;  Surgeon: Milus Banister, MD;  Location: [facility];  Service: Endoscopy;  Laterality: N/A;   LAPAROSCOPIC GASTRECTOMY N/A 10/26/2016   Procedure: LAPAROSCOPIC PARTIAL GASTRECTOMY;  Surgeon: Stark Klein, MD;  Location: [place] [facility];  Service: General;  Laterality: N/A;   LAPAROSCOPIC PARTIAL GASTRECTOMY  10/26/2016   SURGICAL EXCISION OF EXCESSIVE SKIN     fatty tissue       Family History  Problem Relation Age of Onset    Diabetes Father    Kidney failure Father    Colon cancer Father    Hypertension Mother     Social History   Tobacco Use   Smoking status: Never   Smokeless tobacco: Never  Vaping Use   Vaping Use: Never used  Substance Use Topics   Alcohol use: No    Alcohol/week: 0.0 standard drinks   Drug use: No    Home Medications Prior to Admission medications   Medication Sig Start Date End Date Taking? Authorizing Provider  oxyCODONE-acetaminophen (PERCOCET/ROXICET) 5-325 MG tablet Take 1 tablet by mouth every 6 (six) hours as needed for severe pain. 11/19/20  Yes Davan Nawabi, PA-C  amLODipine (NORVASC) 10 MG tablet TAKE 1 TABLET BY MOUTH EVERY DAY 01/30/19   Elby Showers, MD  calcitRIOL (ROCALTROL) 0.5 MCG capsule Take 0.5 mcg by mouth daily. 03/17/19   [provider]  furosemide (LASIX) 40 MG tablet TAKE 1 TABLET (40 MG TOTAL) BY MOUTH DAILY. 09/03/16   Elby Showers, MD  hydrALAZINE (APRESOLINE) 50 MG tablet Take 50 mg by mouth 3 (three) times daily.    [provider]  KLOR-CON M20 20 MEQ tablet TAKE 2 TABLETS BY MOUTH EVERY DAY 03/19/18   Elby Showers, MD  labetalol (NORMODYNE) 300 MG tablet Take 300 mg by mouth every 8 (eight) hours. 08/28/16   [provider]  losartan (COZAAR) 100 MG tablet TAKE 1 TABLET BY MOUTH EVERY DAY 07/01/19   Baxley, Cresenciano Lick, MD  Allergies    No known allergies  Review of Systems   Review of Systems  Constitutional:  Negative for chills and fever.  Musculoskeletal:  Positive for arthralgias.  Neurological:  Negative for syncope and headaches.  All other systems reviewed and are negative.  Physical Exam Updated Vital Signs BP (!) 174/111   Pulse (!) 56   Temp 98 F (36.7 C) (Oral)   Resp 19   SpO2 100%   Physical Exam Vitals and nursing note reviewed.  Constitutional:      Appearance: He is not ill-appearing or diaphoretic.  HENT:     Head: Normocephalic and atraumatic.  Eyes:     Conjunctiva/sclera:  Conjunctivae normal.  Cardiovascular:     Rate and Rhythm: Normal rate and regular rhythm.     Pulses: Normal pulses.  Pulmonary:     Effort: Pulmonary effort is normal.     Breath sounds: Normal breath sounds. No wheezing, rhonchi or rales.  Abdominal:     Palpations: Abdomen is soft.     Tenderness: There is no abdominal tenderness.  Musculoskeletal:     Cervical back: Neck supple.     Comments: Step off noted to left shoulder consistent with likely dislocation with + TTP. ROM limited s/2 pain. Sensation intact throughout with 2+ radial pulse.   Skin:    General: Skin is warm and dry.  Neurological:     Mental Status: He is alert.    ED Results / Procedures / Treatments   Labs (all labs ordered are listed, but only abnormal results are displayed) Labs Reviewed - No data to display  EKG None  Radiology DG Shoulder Left  Result Date: 11/19/2020 CLINICAL DATA:  Fall and left shoulder pain. EXAM: LEFT SHOULDER - 2+ VIEW COMPARISON:  None. FINDINGS: Anterior dislocation of the left shoulder. Lucency involving the left glenoid could be related to subchondral cysts but indeterminate. No definite fracture of the left humerus. Normal alignment at the left [facility] joint. IMPRESSION: Anterior dislocation of left shoulder. Electronically Signed   By: Markus Daft M.D.   On: 11/19/2020 09:56   DG Shoulder Left Portable  Result Date: 11/19/2020 CLINICAL DATA:  Status post shoulder reduction. EXAM: LEFT SHOULDER COMPARISON:  Earlier today FINDINGS: Interval reduction of left shoulder dislocation. Degenerative changes noted involving the [facility] joint. No fracture noted on the single view of the left shoulder. IMPRESSION: Interval reduction of left shoulder dislocation. Electronically Signed   By: Kerby Moors M.D.   On: 11/19/2020 12:15   DG Shoulder Right Portable  Result Date: 11/19/2020 CLINICAL DATA:  Status post left shoulder reduction. EXAM: PORTABLE RIGHT SHOULDER COMPARISON:  None. FINDINGS: No  fracture or dislocation. Mild degenerative changes of the acromioclavicular joint. Nondisplaced fracture of the left posterolateral fifth rib. Soft tissues are normal. IMPRESSION: No acute osseous injury of the right shoulder. Nondisplaced fracture of the left posterolateral fifth rib. Electronically Signed   By: Kathreen Devoid   On: 11/19/2020 11:50    Procedures .Ortho Injury Treatment  Date/Time: 11/19/2020 12:55 PM Performed by: Eustaquio Maize, PA-C Authorized by: Eustaquio Maize, PA-C   Consent:    Consent obtained:  Verbal   Consent given by:  Patient   Risks discussed:  Fracture, nerve damage, irreducible dislocation and recurrent dislocationInjury location: shoulder Location details: left shoulder Injury type: dislocation Dislocation type: anterior Hill-Sachs deformity: no Chronicity: new Pre-procedure neurovascular assessment: neurovascularly intact Manipulation performed: yes Reduction method: external rotation Reduction successful: yes X-ray confirmed reduction: yes Immobilization: sling  Splint Applied by: Sheliah Hatch Post-procedure neurovascular assessment: post-procedure neurovascularly intact     Medications Ordered in ED Medications  fentaNYL (SUBLIMAZE) injection 50 mcg (50 mcg Intravenous Given 11/19/20 1048)  propofol (DIPRIVAN) 10 mg/mL bolus/IV push 117 mg (250 mg Intravenous Given 11/19/20 1141)    ED Course  I have reviewed the triage vital signs and the nursing notes.  Pertinent labs & imaging results that were available during my care of the patient were reviewed by me and considered in my medical decision making (see chart for details).    MDM Rules/Calculators/A&P                          65 year old male who presents to the ED today with complaint of sudden onset left shoulder pain status post slip and fall that occurred earlier this morning.  On arrival to the ED vitals are stable and patient appears to be in no acute distress.  He has step-off noted to  the left shoulder consistent with likely dislocation.  X-ray obtained which does show anterior dislocation at this time.  Fentanyl provided for pain.  Attempted bedside reduction with patient however he requested procedure be ceased due to amount of pain he was in.  He would like conscious sedation.   Conscious sedation performed.  Successfully reduced via x-ray.  Shoulder sling applied.  On reevaluation patient resting comfortably.  Has been able to tolerate p.o. since conscious sedation.  Will discharge home with Ortho follow-up.  Will discharge home with pain medication.  Patient in agreement with plan and stable for discharge.   This note was prepared using Dragon voice recognition software and may include unintentional dictation errors due to the inherent limitations of voice recognition software.  Final Clinical Impression(s) / ED Diagnoses Final diagnoses:  Dislocation of left shoulder joint, initial encounter    Rx / DC Orders ED Discharge Orders          Ordered    oxyCODONE-acetaminophen (PERCOCET/ROXICET) 5-325 MG tablet  Every 6 hours PRN        11/19/20 1258             Discharge Instructions      Please follow up with Dr. Griffin Basil orthopedics for further evaluation of your shoulder dislocation Keep shoulder sling on until you can see him. Please be very careful manipulating your shoulder if you take the sling off to shower.   While at home please rest and ice your shoulder to help reduce pain/swelling. I have prescribed a short course of pain medication to take as needed.   Return to the ED for any new/worsening symptoms including if you feel your shoulder has re-dislocated       Eustaquio Maize, PA-C 11/19/20 [place], [place], MD 11/22/20 [phone]

## 2020-11-19 NOTE — ED Notes (Signed)
Conscious sedation  Consent form signed.  Staff at bedside include:  Alixander Rallis, RN  Denton Ar, RN  MD Schlossman PA Lenora Boys, RT  Anderson Malta, Ortho   Timeout occurred at 1106  Vitals : 164/93 65 HR 92% 4L 22RR  100mg  propofol given by MD at 1108 50mg  propofol given by MD at 1113 Vitals:  157/91 62HR  91% 5L  31 capnography  14 RR  Xray at bedside 1115   Per MD another 50mg  propofol given at 1125 Per MD another 50mg  propofol given at 1130   Vitals:  64HR  93% 5L  148/85 16 RR  1135 complete.  1145 xray at bedside.

## 2020-11-19 NOTE — Discharge Instructions (Addendum)
Please follow up with Dr. Griffin Basil orthopedics for further evaluation of your shoulder dislocation Keep shoulder sling on until you can see him. Please be very careful manipulating your shoulder if you take the sling off to shower.   While at home please rest and ice your shoulder to help reduce pain/swelling. I have prescribed a short course of pain medication to take as needed.   Return to the ED for any new/worsening symptoms including if you feel your shoulder has re-dislocated

## 2020-11-19 NOTE — Progress Notes (Signed)
Orthopedic Tech Progress Note Patient Details:  Larry Clements [birth year] [id number]  Ortho Devices Type of Ortho Device: Shoulder immobilizer Ortho Device/Splint Location: left reduction Ortho Device/Splint Interventions: Application   Post Interventions Patient Tolerated: Well Instructions Provided: Care of device  [name] 11/19/2020, 11:42 AM

## 2020-11-22 NOTE — ED Provider Notes (Signed)
  Physical Exam  BP (!) 165/88 (BP Location: Right Arm)   Pulse 61   Temp 98.1 F (36.7 C)   Resp 18   SpO2 100%   Physical Exam Vitals and nursing note reviewed.  Constitutional:      General: He is not in acute distress.    Appearance: Normal appearance. He is not ill-appearing, toxic-appearing or diaphoretic.  HENT:     Head: Normocephalic.  Eyes:     Conjunctiva/sclera: Conjunctivae normal.  Cardiovascular:     Rate and Rhythm: Normal rate and regular rhythm.     Pulses: Normal pulses.  Pulmonary:     Effort: Pulmonary effort is normal. No respiratory distress.  Musculoskeletal:        General: No deformity or signs of injury.     Cervical back: No rigidity.  Skin:    General: Skin is warm and dry.     Coloration: Skin is not jaundiced or pale.  Neurological:     General: No focal deficit present.     Mental Status: He is alert and oriented to person, place, and time.    ED Course/Procedures     .Sedation  Date/Time: 11/22/2020 7:49 AM Performed by: Gareth Morgan, MD Authorized by: Gareth Morgan, MD   Consent:    Consent obtained:  Verbal and written   Consent given by:  Patient   Risks discussed:  Allergic reaction, dysrhythmia, inadequate sedation, nausea, prolonged hypoxia resulting in organ damage, prolonged sedation necessitating reversal, respiratory compromise necessitating ventilatory assistance and intubation and vomiting   Alternatives discussed:  Analgesia without sedation and regional anesthesia Universal protocol:    Immediately prior to procedure, a time out was called: yes     Patient identity confirmed:  Arm band and verbally with patient Indications:    Procedure performed:  Dislocation reduction   Procedure necessitating sedation performed by:  Physician performing sedation Pre-sedation assessment:    Time since last food or drink:  4   ASA classification: class 2 - patient with mild systemic disease     Mouth opening:  2 finger  widths   Mallampati score:  II - soft palate, uvula, fauces visible   Neck mobility: normal     Pre-sedation assessments completed and reviewed: airway patency, cardiovascular function, hydration status, mental status, nausea/vomiting and pain level   Immediate pre-procedure details:    Reassessment: Patient reassessed immediately prior to procedure     Reviewed: vital signs     Verified: bag valve mask available, emergency equipment available, intubation equipment available, IV patency confirmed, oxygen available and suction available   Procedure details (see MAR for exact dosages):    Preoxygenation:  Room air   Sedation:  Propofol   Intended level of sedation: deep   Analgesia:  Fentanyl   Intra-procedure events: none     Total Provider sedation time (minutes):  25 Post-procedure details:    Patient is stable for discharge or admission: yes     Procedure completion:  Tolerated well, no immediate complications  MDM  Shared visit with Alroy Bailiff PA-C  Sedation for shoulder dislocation, see her note for details.        Gareth Morgan, MD 11/22/20 [phone]

## 2021-09-08 ENCOUNTER — Encounter: Payer: Self-pay | Admitting: Internal Medicine

## 2021-10-06 LAB — HEPATIC FUNCTION PANEL
ALT: 23 U/L (ref 10–40)
AST: 31 (ref 14–40)
Alkaline Phosphatase: 73 (ref 25–125)
Bilirubin, Total: 0.4

## 2021-10-06 LAB — BASIC METABOLIC PANEL
BUN: 51 — AB (ref 5–18)
CO2: 23 — AB (ref 13–22)
Chloride: 106 (ref 99–108)
Creatinine: 6.6 — AB (ref 0.5–1.1)
Glucose: 94
Potassium: 4.3 mEq/L (ref 3.5–5.1)
Sodium: 136 — AB (ref 137–147)

## 2021-10-06 LAB — MICROALBUMIN / CREATININE URINE RATIO: Microalb Creat Ratio: 1521

## 2021-10-06 LAB — CBC AND DIFFERENTIAL
HCT: 23 — AB (ref 41–53)
Hemoglobin: 7.6 — AB (ref 13.5–17.5)
Neutrophils Absolute: 2.9
Platelets: 186 10*3/uL (ref 150–400)
WBC: 4.1 — AB (ref 5.0–15.0)

## 2021-10-06 LAB — COMPREHENSIVE METABOLIC PANEL
Albumin: 4.1 (ref 3.5–5.0)
Calcium: 9 (ref 8.7–10.7)
eGFR: 9

## 2021-10-06 LAB — IRON,TIBC AND FERRITIN PANEL
%SAT: 20
Ferritin: 161
Iron: 58
TIBC: 293
UIBC: 235

## 2021-10-06 LAB — CBC: RBC: 2.76 — AB (ref 3.87–5.11)

## 2021-10-06 LAB — PROTEIN / CREATININE RATIO, URINE
Albumin, U: 1032.5
Creatinine, Urine: 67.9

## 2021-10-13 ENCOUNTER — Inpatient Hospital Stay (HOSPITAL_COMMUNITY)
Admission: RE | Admit: 2021-10-13 | Discharge: 2021-10-13 | Disposition: A | Discharge status: Home / Self Care | Payer: BC Managed Care – PPO | Source: Ambulatory Visit | Attending: Nephrology | Admitting: Nephrology

## 2021-10-13 ENCOUNTER — Encounter (HOSPITAL_COMMUNITY): Payer: Self-pay

## 2021-10-17 ENCOUNTER — Encounter: Payer: Self-pay | Admitting: Internal Medicine

## 2021-11-16 ENCOUNTER — Inpatient Hospital Stay (HOSPITAL_COMMUNITY): Admission: RE | Admit: 2021-11-16 | Payer: BC Managed Care – PPO | Source: Ambulatory Visit

## 2021-11-17 ENCOUNTER — Encounter (HOSPITAL_COMMUNITY)
Admission: RE | Admit: 2021-11-17 | Discharge: 2021-11-17 | Disposition: A | Discharge status: Home / Self Care | Payer: BC Managed Care – PPO | Source: Ambulatory Visit | Attending: Nephrology | Admitting: Nephrology

## 2021-11-17 VITALS — BP 167/91 | HR 74 | Temp 97.5°F | Resp 18

## 2021-11-17 DIAGNOSIS — N183 Chronic kidney disease, stage 3 unspecified: Secondary | ICD-10-CM | POA: Insufficient documentation

## 2021-11-17 LAB — POCT HEMOGLOBIN-HEMACUE: Hemoglobin: 8.4 g/dL — ABNORMAL LOW (ref 13.0–17.0)

## 2021-11-17 MED ORDER — EPOETIN ALFA 10000 UNIT/ML IJ SOLN
INTRAMUSCULAR | Status: AC
  Filled 2021-11-17: qty 1

## 2021-11-17 MED ORDER — EPOETIN ALFA 20000 UNIT/ML IJ SOLN: 20000.0000 [IU] | INTRAMUSCULAR | Status: DC

## 2021-11-17 MED ORDER — EPOETIN ALFA-EPBX 40000 UNIT/ML IJ SOLN
20000.0000 [IU] | INTRAMUSCULAR | Status: DC
Start: 2021-11-17 — End: 2021-11-17

## 2021-11-17 MED ORDER — EPOETIN ALFA 10000 UNIT/ML IJ SOLN
10000.0000 [IU] | INTRAMUSCULAR | Status: DC
Start: 2021-11-17 — End: 2021-11-17
  Administered 2021-11-17: 10000 [IU] via SUBCUTANEOUS

## 2021-12-14 ENCOUNTER — Encounter (HOSPITAL_COMMUNITY): Payer: BC Managed Care – PPO

## 2021-12-15 ENCOUNTER — Encounter (HOSPITAL_COMMUNITY)
Admission: RE | Admit: 2021-12-15 | Discharge: 2021-12-15 | Disposition: A | Discharge status: Home / Self Care | Payer: BC Managed Care – PPO | Source: Ambulatory Visit | Attending: Nephrology | Admitting: Nephrology

## 2021-12-15 VITALS — BP 153/84 | HR 73 | Temp 97.1°F | Resp 18

## 2021-12-15 DIAGNOSIS — N183 Chronic kidney disease, stage 3 unspecified: Secondary | ICD-10-CM | POA: Diagnosis present

## 2021-12-15 LAB — POCT HEMOGLOBIN-HEMACUE: Hemoglobin: 8.3 g/dL — ABNORMAL LOW (ref 13.0–17.0)

## 2021-12-15 LAB — FERRITIN: Ferritin: 86 ng/mL (ref 24–336)

## 2021-12-15 LAB — IRON AND TIBC
Iron: 64 ug/dL (ref 45–182)
Saturation Ratios: 19 % (ref 17.9–39.5)
TIBC: 335 ug/dL (ref 250–450)
UIBC: 271 ug/dL

## 2021-12-15 MED ORDER — EPOETIN ALFA 20000 UNIT/ML IJ SOLN: 20000.0000 [IU] | INTRAMUSCULAR | Status: DC

## 2021-12-15 MED ORDER — EPOETIN ALFA 20000 UNIT/ML IJ SOLN
INTRAMUSCULAR | Status: AC
  Administered 2021-12-15: 20000 [IU] via SUBCUTANEOUS
  Filled 2021-12-15: qty 1

## 2021-12-15 MED ORDER — EPOETIN ALFA-EPBX 40000 UNIT/ML IJ SOLN: 20000.0000 [IU] | INTRAMUSCULAR | Status: DC

## 2022-01-07 ENCOUNTER — Emergency Department (HOSPITAL_COMMUNITY): Payer: BC Managed Care – PPO

## 2022-01-07 ENCOUNTER — Emergency Department (HOSPITAL_COMMUNITY)
Admission: EM | Admit: 2022-01-07 | Discharge: 2022-01-07 | Disposition: A | Discharge status: Home / Self Care | Payer: BC Managed Care – PPO | Attending: Emergency Medicine | Admitting: Emergency Medicine

## 2022-01-07 ENCOUNTER — Other Ambulatory Visit: Payer: Self-pay

## 2022-01-07 DIAGNOSIS — R519 Headache, unspecified: Secondary | ICD-10-CM | POA: Insufficient documentation

## 2022-01-07 DIAGNOSIS — Z79899 Other long term (current) drug therapy: Secondary | ICD-10-CM | POA: Diagnosis not present

## 2022-01-07 DIAGNOSIS — I10 Essential (primary) hypertension: Secondary | ICD-10-CM | POA: Insufficient documentation

## 2022-01-07 DIAGNOSIS — Y92410 Unspecified street and highway as the place of occurrence of the external cause: Secondary | ICD-10-CM | POA: Diagnosis not present

## 2022-01-07 DIAGNOSIS — S20212A Contusion of left front wall of thorax, initial encounter: Secondary | ICD-10-CM | POA: Diagnosis not present

## 2022-01-07 DIAGNOSIS — S199XXA Unspecified injury of neck, initial encounter: Secondary | ICD-10-CM | POA: Diagnosis present

## 2022-01-07 DIAGNOSIS — S161XXA Strain of muscle, fascia and tendon at neck level, initial encounter: Secondary | ICD-10-CM | POA: Diagnosis not present

## 2022-01-07 MED ORDER — NAPROXEN 500 MG PO TABS: 500.0000 mg | ORAL_TABLET | Freq: Two times a day (BID) | ORAL | 0 refills | Status: DC

## 2022-01-07 MED ORDER — KETOROLAC TROMETHAMINE 60 MG/2ML IM SOLN
60.0000 mg | Freq: Once | INTRAMUSCULAR | Status: AC
Start: 2022-01-07 — End: 2022-01-07
  Administered 2022-01-07: 60 mg via INTRAMUSCULAR
  Filled 2022-01-07: qty 2

## 2022-01-07 MED ORDER — METHOCARBAMOL 500 MG PO TABS
1000.0000 mg | ORAL_TABLET | Freq: Once | ORAL | Status: AC
  Administered 2022-01-07: 1000 mg via ORAL
  Filled 2022-01-07: qty 2

## 2022-01-07 MED ORDER — METHOCARBAMOL 500 MG PO TABS: 500.0000 mg | ORAL_TABLET | Freq: Two times a day (BID) | ORAL | 0 refills | Status: DC | PRN

## 2022-01-07 NOTE — ED Notes (Signed)
Pt to CT scanner at this time 
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
## 2022-01-07 NOTE — Discharge Instructions (Signed)
Your CT scans of the head and spine are normal without any signs of traumatic injury but you do have a cervical sprain, please read the attached instructions  Ice, Naprosyn, Robaxin to help with some of the pain and muscle spasm  See your doctor in 3 days for ongoing evaluation, this may last for up to 2 weeks but should get better rather quickly  Emergency department for severe worsening symptoms

## 2022-01-07 NOTE — ED Notes (Signed)
Per Dr. Sabra Heck, not level trauma at this time, charge Jinny Blossom made aware

## 2022-01-07 NOTE — ED Notes (Signed)
Patient verbalizes understanding of d/c instructions. Opportunities for questions and answers were provided. Pt d/c from ED and wheeled to lobby with son.

## 2022-01-11 ENCOUNTER — Telehealth: Payer: Self-pay | Admitting: Internal Medicine
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
## 2022-01-12 ENCOUNTER — Ambulatory Visit (INDEPENDENT_AMBULATORY_CARE_PROVIDER_SITE_OTHER): Payer: BC Managed Care – PPO | Admitting: Internal Medicine

## 2022-01-12 ENCOUNTER — Encounter (HOSPITAL_COMMUNITY)
Admission: RE | Admit: 2022-01-12 | Discharge: 2022-01-12 | Disposition: A | Discharge status: Home / Self Care | Payer: BC Managed Care – PPO | Source: Ambulatory Visit | Attending: Nephrology | Admitting: Nephrology

## 2022-01-12 ENCOUNTER — Encounter: Payer: Self-pay | Admitting: Internal Medicine

## 2022-01-12 VITALS — BP 136/70 | HR 68 | Temp 97.8°F | Ht 74.0 in | Wt 256.0 lb

## 2022-01-12 VITALS — BP 170/78 | HR 80 | Temp 96.8°F | Resp 18

## 2022-01-12 DIAGNOSIS — M542 Cervicalgia: Secondary | ICD-10-CM | POA: Diagnosis not present

## 2022-01-12 DIAGNOSIS — M25512 Pain in left shoulder: Secondary | ICD-10-CM

## 2022-01-12 DIAGNOSIS — N183 Chronic kidney disease, stage 3 unspecified: Secondary | ICD-10-CM

## 2022-01-12 MED ORDER — EPOETIN ALFA-EPBX 10000 UNIT/ML IJ SOLN
20000.0000 [IU] | INTRAMUSCULAR | Status: DC
  Administered 2022-01-12: 20000 [IU] via SUBCUTANEOUS

## 2022-01-12 MED ORDER — EPOETIN ALFA-EPBX 10000 UNIT/ML IJ SOLN
INTRAMUSCULAR | Status: AC
  Filled 2022-01-12: qty 2
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
## 2022-01-13 ENCOUNTER — Ambulatory Visit
Admission: RE | Admit: 2022-01-13 | Discharge: 2022-01-13 | Disposition: A | Discharge status: Home / Self Care | Payer: BC Managed Care – PPO | Source: Ambulatory Visit | Attending: Internal Medicine | Admitting: Internal Medicine

## 2022-01-26 LAB — POCT HEMOGLOBIN-HEMACUE: Hemoglobin: 8 g/dL — ABNORMAL LOW (ref 13.0–17.0)

## 2022-01-30 ENCOUNTER — Ambulatory Visit: Payer: BC Managed Care – PPO | Admitting: Internal Medicine
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
## 2022-01-31 ENCOUNTER — Ambulatory Visit: Payer: BC Managed Care – PPO | Attending: Internal Medicine | Admitting: Physical Therapy

## 2022-01-31 ENCOUNTER — Other Ambulatory Visit: Payer: Self-pay

## 2022-01-31 ENCOUNTER — Encounter: Payer: Self-pay | Admitting: Physical Therapy

## 2022-01-31 DIAGNOSIS — R293 Abnormal posture: Secondary | ICD-10-CM | POA: Insufficient documentation

## 2022-01-31 DIAGNOSIS — M25612 Stiffness of left shoulder, not elsewhere classified: Secondary | ICD-10-CM | POA: Diagnosis present

## 2022-01-31 DIAGNOSIS — M542 Cervicalgia: Secondary | ICD-10-CM | POA: Insufficient documentation

## 2022-01-31 DIAGNOSIS — M25611 Stiffness of right shoulder, not elsewhere classified: Secondary | ICD-10-CM | POA: Diagnosis present

## 2022-02-07 ENCOUNTER — Encounter: Payer: Self-pay | Admitting: Physical Therapy

## 2022-02-07 ENCOUNTER — Ambulatory Visit: Payer: BC Managed Care – PPO | Admitting: Physical Therapy

## 2022-02-07 DIAGNOSIS — M25611 Stiffness of right shoulder, not elsewhere classified: Secondary | ICD-10-CM

## 2022-02-07 DIAGNOSIS — M542 Cervicalgia: Secondary | ICD-10-CM

## 2022-02-07 DIAGNOSIS — R293 Abnormal posture: Secondary | ICD-10-CM

## 2022-02-07 DIAGNOSIS — M25612 Stiffness of left shoulder, not elsewhere classified: Secondary | ICD-10-CM
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
## 2022-02-09 ENCOUNTER — Ambulatory Visit (HOSPITAL_COMMUNITY)
Admission: RE | Admit: 2022-02-09 | Discharge: 2022-02-09 | Disposition: A | Discharge status: Home / Self Care | Payer: BC Managed Care – PPO | Source: Ambulatory Visit | Attending: Nephrology | Admitting: Nephrology

## 2022-02-09 ENCOUNTER — Encounter: Payer: Self-pay | Admitting: Physical Therapy

## 2022-02-09 ENCOUNTER — Ambulatory Visit: Payer: BC Managed Care – PPO | Admitting: Physical Therapy

## 2022-02-09 VITALS — BP 152/78 | HR 72 | Temp 97.2°F

## 2022-02-09 DIAGNOSIS — R293 Abnormal posture: Secondary | ICD-10-CM

## 2022-02-09 DIAGNOSIS — M542 Cervicalgia: Secondary | ICD-10-CM

## 2022-02-09 DIAGNOSIS — M25612 Stiffness of left shoulder, not elsewhere classified: Secondary | ICD-10-CM

## 2022-02-09 DIAGNOSIS — N183 Chronic kidney disease, stage 3 unspecified: Secondary | ICD-10-CM | POA: Diagnosis not present

## 2022-02-09 DIAGNOSIS — M25611 Stiffness of right shoulder, not elsewhere classified: Secondary | ICD-10-CM

## 2022-02-09 LAB — IRON AND TIBC
Iron: 61 ug/dL (ref 45–182)
Saturation Ratios: 18 % (ref 17.9–39.5)
TIBC: 344 ug/dL (ref 250–450)
UIBC: 283 ug/dL

## 2022-02-09 LAB — FERRITIN: Ferritin: 89 ng/mL (ref 24–336)

## 2022-02-09 LAB — POCT HEMOGLOBIN-HEMACUE: Hemoglobin: 8.4 g/dL — ABNORMAL LOW (ref 13.0–17.0)

## 2022-02-09 MED ORDER — EPOETIN ALFA-EPBX 10000 UNIT/ML IJ SOLN
20000.0000 [IU] | INTRAMUSCULAR | Status: DC
  Administered 2022-02-09: 20000 [IU] via SUBCUTANEOUS

## 2022-02-09 MED ORDER — EPOETIN ALFA-EPBX 10000 UNIT/ML IJ SOLN
INTRAMUSCULAR | Status: AC
  Filled 2022-02-09: qty 2
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
## 2022-02-10 ENCOUNTER — Encounter: Payer: Self-pay | Admitting: Internal Medicine

## 2022-02-10 ENCOUNTER — Ambulatory Visit (INDEPENDENT_AMBULATORY_CARE_PROVIDER_SITE_OTHER): Payer: BC Managed Care – PPO | Admitting: Internal Medicine

## 2022-02-10 VITALS — BP 136/70 | HR 79 | Temp 97.9°F | Ht 74.0 in | Wt 254.0 lb

## 2022-02-10 DIAGNOSIS — M19012 Primary osteoarthritis, left shoulder: Secondary | ICD-10-CM
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
## 2022-02-10 NOTE — Patient Instructions (Signed)
Continue with physical therapy for left shoulder pain and arthropathy.  May need MRI of the left shoulder if not making improvement at next visit.

## 2022-02-14 ENCOUNTER — Ambulatory Visit: Payer: BC Managed Care – PPO | Admitting: Physical Therapy

## 2022-02-15 ENCOUNTER — Emergency Department (HOSPITAL_COMMUNITY): Payer: BC Managed Care – PPO

## 2022-02-15 ENCOUNTER — Inpatient Hospital Stay (HOSPITAL_COMMUNITY): Payer: BC Managed Care – PPO

## 2022-02-15 ENCOUNTER — Encounter (HOSPITAL_COMMUNITY): Payer: Self-pay

## 2022-02-15 ENCOUNTER — Inpatient Hospital Stay (HOSPITAL_COMMUNITY)
Admission: EM | Admit: 2022-02-15 | Discharge: 2022-02-16 | DRG: 155 | Disposition: A | Discharge status: Home / Self Care | Payer: BC Managed Care – PPO | Attending: Internal Medicine | Admitting: Internal Medicine

## 2022-02-15 ENCOUNTER — Other Ambulatory Visit: Payer: Self-pay

## 2022-02-15 DIAGNOSIS — E041 Nontoxic single thyroid nodule: Secondary | ICD-10-CM | POA: Diagnosis present

## 2022-02-15 DIAGNOSIS — Z903 Acquired absence of stomach [part of]: Secondary | ICD-10-CM | POA: Diagnosis not present

## 2022-02-15 DIAGNOSIS — C49A2 Gastrointestinal stromal tumor of stomach: Secondary | ICD-10-CM | POA: Diagnosis present

## 2022-02-15 DIAGNOSIS — Z841 Family history of disorders of kidney and ureter: Secondary | ICD-10-CM

## 2022-02-15 DIAGNOSIS — Z6830 Body mass index (BMI) 30.0-30.9, adult: Secondary | ICD-10-CM | POA: Diagnosis not present

## 2022-02-15 DIAGNOSIS — I120 Hypertensive chronic kidney disease with stage 5 chronic kidney disease or end stage renal disease: Secondary | ICD-10-CM | POA: Diagnosis present

## 2022-02-15 DIAGNOSIS — E669 Obesity, unspecified: Secondary | ICD-10-CM | POA: Diagnosis present

## 2022-02-15 DIAGNOSIS — N185 Chronic kidney disease, stage 5: Secondary | ICD-10-CM | POA: Diagnosis present

## 2022-02-15 DIAGNOSIS — K1120 Sialoadenitis, unspecified: Principal | ICD-10-CM | POA: Diagnosis present

## 2022-02-15 DIAGNOSIS — N186 End stage renal disease: Secondary | ICD-10-CM | POA: Diagnosis not present

## 2022-02-15 DIAGNOSIS — Z85831 Personal history of malignant neoplasm of soft tissue: Secondary | ICD-10-CM | POA: Diagnosis not present

## 2022-02-15 DIAGNOSIS — Z800 Family history of malignant neoplasm of digestive organs: Secondary | ICD-10-CM | POA: Diagnosis not present

## 2022-02-15 DIAGNOSIS — I10 Essential (primary) hypertension: Secondary | ICD-10-CM | POA: Diagnosis present

## 2022-02-15 DIAGNOSIS — Z8249 Family history of ischemic heart disease and other diseases of the circulatory system: Secondary | ICD-10-CM

## 2022-02-15 DIAGNOSIS — D631 Anemia in chronic kidney disease: Secondary | ICD-10-CM | POA: Diagnosis present

## 2022-02-15 DIAGNOSIS — Z833 Family history of diabetes mellitus: Secondary | ICD-10-CM | POA: Diagnosis not present

## 2022-02-15 DIAGNOSIS — Z79899 Other long term (current) drug therapy: Secondary | ICD-10-CM | POA: Diagnosis not present

## 2022-02-15 LAB — CBC WITH DIFFERENTIAL/PLATELET
Abs Immature Granulocytes: 0.04 10*3/uL (ref 0.00–0.07)
Basophils Absolute: 0 10*3/uL (ref 0.0–0.1)
Basophils Relative: 0 %
Eosinophils Absolute: 0.1 10*3/uL (ref 0.0–0.5)
Eosinophils Relative: 1 %
HCT: 27.4 % — ABNORMAL LOW (ref 39.0–52.0)
Hemoglobin: 8.6 g/dL — ABNORMAL LOW (ref 13.0–17.0)
Immature Granulocytes: 1 %
Lymphocytes Relative: 7 %
Lymphs Abs: 0.6 10*3/uL — ABNORMAL LOW (ref 0.7–4.0)
MCH: 28.1 pg (ref 26.0–34.0)
MCHC: 31.4 g/dL (ref 30.0–36.0)
MCV: 89.5 fL (ref 80.0–100.0)
Monocytes Absolute: 1 10*3/uL (ref 0.1–1.0)
Monocytes Relative: 12 %
Neutro Abs: 6.4 10*3/uL (ref 1.7–7.7)
Neutrophils Relative %: 79 %
Platelets: 216 10*3/uL (ref 150–400)
RBC: 3.06 MIL/uL — ABNORMAL LOW (ref 4.22–5.81)
RDW: 14.7 % (ref 11.5–15.5)
WBC: 8.1 10*3/uL (ref 4.0–10.5)
nRBC: 0 % (ref 0.0–0.2)

## 2022-02-15 LAB — BASIC METABOLIC PANEL
Anion gap: 8 (ref 5–15)
BUN: 48 mg/dL — ABNORMAL HIGH (ref 8–23)
CO2: 22 mmol/L (ref 22–32)
Calcium: 9 mg/dL (ref 8.9–10.3)
Chloride: 105 mmol/L (ref 98–111)
Creatinine, Ser: 8.36 mg/dL — ABNORMAL HIGH (ref 0.61–1.24)
GFR, Estimated: 7 mL/min — ABNORMAL LOW (ref >60)
Glucose, Bld: 115 mg/dL — ABNORMAL HIGH (ref 70–99)
Potassium: 4 mmol/L (ref 3.5–5.1)
Sodium: 135 mmol/L (ref 135–145)

## 2022-02-15 MED ORDER — ACETAMINOPHEN 650 MG RE SUPP: 650.0000 mg | Freq: Four times a day (QID) | RECTAL | Status: DC | PRN

## 2022-02-15 MED ORDER — HYDRALAZINE HCL 25 MG PO TABS
100.0000 mg | ORAL_TABLET | Freq: Three times a day (TID) | ORAL | Status: DC
  Administered 2022-02-15 – 2022-02-16 (×4): 100 mg via ORAL
  Filled 2022-02-15 (×4): qty 4

## 2022-02-15 MED ORDER — SODIUM CHLORIDE 0.9 % IV SOLN: INTRAVENOUS | Status: DC | PRN

## 2022-02-15 MED ORDER — DOXAZOSIN MESYLATE 4 MG PO TABS
6.0000 mg | ORAL_TABLET | Freq: Every day | ORAL | Status: DC
  Administered 2022-02-15: 6 mg via ORAL
  Filled 2022-02-15: qty 1

## 2022-02-15 MED ORDER — ONDANSETRON HCL 4 MG PO TABS: 4.0000 mg | ORAL_TABLET | Freq: Four times a day (QID) | ORAL | Status: DC | PRN

## 2022-02-15 MED ORDER — ONDANSETRON HCL 4 MG/2ML IJ SOLN: 4.0000 mg | Freq: Four times a day (QID) | INTRAMUSCULAR | Status: DC | PRN

## 2022-02-15 MED ORDER — IRBESARTAN 300 MG PO TABS: 300.0000 mg | ORAL_TABLET | Freq: Every day | ORAL | Status: DC

## 2022-02-15 MED ORDER — POTASSIUM CHLORIDE CRYS ER 20 MEQ PO TBCR
40.0000 meq | EXTENDED_RELEASE_TABLET | Freq: Every day | ORAL | Status: DC
  Administered 2022-02-15 – 2022-02-16 (×2): 40 meq via ORAL
  Filled 2022-02-15 (×2): qty 2

## 2022-02-15 MED ORDER — METHOCARBAMOL 500 MG PO TABS: 500.0000 mg | ORAL_TABLET | Freq: Two times a day (BID) | ORAL | Status: DC | PRN

## 2022-02-15 MED ORDER — OXYCODONE-ACETAMINOPHEN 5-325 MG PO TABS: 1.0000 | ORAL_TABLET | Freq: Four times a day (QID) | ORAL | Status: DC | PRN

## 2022-02-15 MED ORDER — METHYLPREDNISOLONE SODIUM SUCC 125 MG IJ SOLR
125.0000 mg | Freq: Once | INTRAMUSCULAR | Status: AC
  Administered 2022-02-15: 125 mg via INTRAVENOUS
  Filled 2022-02-15: qty 2

## 2022-02-15 MED ORDER — CALCITRIOL 0.5 MCG PO CAPS
0.5000 ug | ORAL_CAPSULE | Freq: Every day | ORAL | Status: DC
  Administered 2022-02-15 – 2022-02-16 (×2): 0.5 ug via ORAL
  Filled 2022-02-15 (×3): qty 1

## 2022-02-15 MED ORDER — ACETAMINOPHEN 325 MG PO TABS: 650.0000 mg | ORAL_TABLET | Freq: Four times a day (QID) | ORAL | Status: DC | PRN

## 2022-02-15 MED ORDER — AMLODIPINE BESYLATE 10 MG PO TABS
10.0000 mg | ORAL_TABLET | Freq: Every day | ORAL | Status: DC
  Administered 2022-02-15 – 2022-02-16 (×2): 10 mg via ORAL
  Filled 2022-02-15: qty 1
  Filled 2022-02-15: qty 2

## 2022-02-15 MED ORDER — LABETALOL HCL 100 MG PO TABS
300.0000 mg | ORAL_TABLET | Freq: Three times a day (TID) | ORAL | Status: DC
  Administered 2022-02-15 – 2022-02-16 (×4): 300 mg via ORAL
  Filled 2022-02-15 (×3): qty 3
  Filled 2022-02-15: qty 1
  Filled 2022-02-15: qty 3

## 2022-02-15 MED ORDER — CLONIDINE HCL 0.1 MG PO TABS
0.1000 mg | ORAL_TABLET | Freq: Once | ORAL | Status: AC
  Administered 2022-02-15: 0.1 mg via ORAL
  Filled 2022-02-15: qty 1

## 2022-02-15 MED ORDER — SODIUM CHLORIDE 0.9 % IV SOLN
3.0000 g | Freq: Two times a day (BID) | INTRAVENOUS | Status: DC
  Administered 2022-02-15 – 2022-02-16 (×3): 3 g via INTRAVENOUS
  Filled 2022-02-15 (×3): qty 8

## 2022-02-15 MED ORDER — HEPARIN SODIUM (PORCINE) 5000 UNIT/ML IJ SOLN
5000.0000 [IU] | Freq: Three times a day (TID) | INTRAMUSCULAR | Status: DC
  Administered 2022-02-15 – 2022-02-16 (×4): 5000 [IU] via SUBCUTANEOUS
  Filled 2022-02-15 (×4): qty 1

## 2022-02-15 MED ORDER — IRBESARTAN 300 MG PO TABS
300.0000 mg | ORAL_TABLET | Freq: Every day | ORAL | Status: DC
  Administered 2022-02-15 – 2022-02-16 (×2): 300 mg via ORAL
  Filled 2022-02-15 (×2): qty 1

## 2022-02-15 MED ORDER — FUROSEMIDE 40 MG PO TABS
40.0000 mg | ORAL_TABLET | Freq: Every day | ORAL | Status: DC
  Administered 2022-02-15 – 2022-02-16 (×2): 40 mg via ORAL
  Filled 2022-02-15 (×2): qty 1

## 2022-02-15 NOTE — Consult Note (Signed)
Renal Service Consult Note [facility]  Larry Clements 02/15/2022 Larry Blazing, MD Requesting Physician: Dr. Olevia Clements  Reason for Consult: Renal failure HPI: The patient is a 66 y.o. year-old w/ hx of CKD V (membranous, +PLA2R, 2019), hx GIST and HTN who presents w/ L facial swelling for 3 days. No fevers or pain. In ED pt afeb, BP 180/ 80, 98% on RA. CT neck showed L parotiditis and submandibular sialadenitis. No abscess seen.  Pt rec'd IV unasyn and IV solumedrol. Pt admitted. We are asked to see for renal failure.   Pt is f/b Dr Larry [facility] at [facility], last appt was 01/25/22 where diagnosis was progressive CKD w/ notes stating that pt "will likely need dialysis in the near future". Vasc access referral was recommended, but pt and daughter refused.   Pt seen in ED. Asked about dialysis he said "I'm not doing that unless I'm sick and absolutely need it". He denies any N/V, loss of appetite or fatigue or somnolence. Main c/o is the swelling of the L face, but no serious pain.   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 PLA2R staining +. Mod to severe arterionephrosclerosis, mod TI scarring, secondary focal and segmental tuft sclerosis   Past Surgical History  Past Surgical History:  Procedure Laterality Date   COLONOSCOPY     EUS N/A 09/21/2016   Procedure: UPPER ENDOSCOPIC ULTRASOUND (EUS) LINEAR;  Surgeon: Larry Banister, MD;  Location: [facility];  Service: Endoscopy;  Laterality: N/A;   LAPAROSCOPIC GASTRECTOMY N/A 10/26/2016   Procedure: LAPAROSCOPIC PARTIAL GASTRECTOMY;  Surgeon: Larry Klein, MD;  Location: [place];  Service:  General;  Laterality: N/A;   LAPAROSCOPIC PARTIAL GASTRECTOMY  10/26/2016   SURGICAL EXCISION OF EXCESSIVE SKIN     fatty tissue   Family History  Family History  Problem Relation Age of Onset   Diabetes Father    Kidney failure Father    Colon cancer Father    Hypertension Mother    Social History  reports that he has never smoked. He has never used smokeless tobacco. He reports that he does not drink alcohol and does not use drugs. Allergies  Allergies  Allergen Reactions   No Known Allergies    Home medications Prior to Admission medications   Medication Sig Start Date End Date Taking? Authorizing Provider  acetaminophen (TYLENOL) 500 MG tablet Take 1,000 mg by mouth every 6 (six) hours as needed for mild pain.   Yes [provider]  amLODipine (NORVASC) 10 MG tablet TAKE 1 TABLET BY MOUTH EVERY DAY Patient taking differently: Take 10 mg by mouth daily. 01/30/19  Yes Clements, Larry Lick, MD  calcitRIOL (ROCALTROL) 0.5 MCG capsule Take 0.5 mcg by mouth daily. 03/17/19  Yes [provider]  doxazosin (CARDURA) 4 MG tablet Take 6 mg by mouth at bedtime. 01/04/22  Yes [provider]  furosemide (LASIX) 40 MG tablet TAKE 1 TABLET (40 MG TOTAL) BY MOUTH DAILY. 09/03/16  Yes Clements, Larry Lick, MD  hydrALAZINE (APRESOLINE) 100 MG tablet Take 100 mg by mouth 3 (three) times daily.   Yes [provider]  KLOR-CON M20 20 MEQ tablet TAKE 2 TABLETS BY MOUTH EVERY DAY Patient taking differently: 40 mEq daily. 03/19/18  Yes Clements, Larry Lick, MD  labetalol (NORMODYNE) 300 MG tablet Take 300 mg by mouth every 8 (eight) hours. 08/28/16  Yes [provider]  methocarbamol (ROBAXIN) 500 MG tablet Take 1 tablet (500 mg total) by mouth 2 (two) times daily as needed for muscle spasms. 01/07/22  Yes Larry Chapel, MD  naproxen (NAPROSYN) 500 MG tablet Take 1 tablet (500 mg total) by mouth 2 (two) times daily with a meal. 01/07/22  Yes Larry Chapel, MD   oxyCODONE-acetaminophen (PERCOCET/ROXICET) 5-325 MG tablet Take 1 tablet by mouth every 6 (six) hours as needed for severe pain. 11/19/20  Yes Clements, [name], PA-C  telmisartan (MICARDIS) 80 MG tablet Take 80 mg by mouth daily. 12/25/21  Yes [provider]     Vitals:   02/15/22 0728 02/15/22 0845 02/15/22 1131 02/15/22 1300  BP:  (!) 172/90  (!) 178/157  Pulse:  70  81  Resp:  17  18  Temp: 98.3 F (36.8 C)  98 F (36.7 C)   TempSrc: Oral  Oral   SpO2:  97%  98%  Weight:      Height:       Exam Gen alert, no distress No rash, cyanosis or gangrene Sclera anicteric, throat clear  No jvd or bruits Chest clear bilat to bases, no rales/ wheezing RRR no MRG Abd soft ntnd no mass or ascites +bs GU normal male MS no joint effusions or deformity Ext no LE or UE edema, no wounds or ulcers Neuro is alert, Ox 3 , nf    Home meds include - amlodipine 10, doxazosin 6 hs, furosemide 40 qd, klorcon 40 qd, labetalol 300 tid, naproxen 500 bid prn, oxycodone/ aceta prn, telmisartan 30 qd, prns/ vits/ supps    Date  Creat  eGFR   2013- 17 1.45- 1.72    2018  1.50- 2.30    2019  2.36- 2.51   2020  2.55- 3.12   2021  3.07- 3.16   2022  3.37  20 ml/min, stage IV   10/06/21 6.60  9 ml/min, stage V   02/15/22 8.36   7 ml/min      Na 135  K 4  BUN 48  Cr 8.36   Ca 9.0   Hb 8.6      BP's 140- 189/ 80- 95, HR 70s  RR 18  afeb              Assessment/ Plan: CKD 5 - b/l creat 6.6 from may 2023, eGFR 9 ml/min. Renal function has been progressively worsening this year and pt is approaching dialysis but is not uremic at this time. Pt recently seen in our office early September and recommended to see vasc surgery for HD access placement but he refused. Today pt is stable but creat up to 8. Pt w/o any uremic symptoms. He does need dialysis at this time and is here for non renal issue. Will cont all his BP meds, including ARB, given this is a chronic medication for him and the renal function  has been declining unrelated to this medication.  HTN - significant severity, on 4 home BP meds and po lasix 40 qd. Volume - looks euvolemic on exam, cont lasix 40 qd Parotiditis/ sialadenitis - getting IV abx    Kelly Splinter  MD 02/15/2022, 2:31 PM Recent Labs  Lab 02/09/22 1052 02/15/22 0358  HGB 8.4* 8.6*  CALCIUM  --  9.0  CREATININE  --  8.36*  K  --  4.0   Inpatient medications:  amLODipine  10 mg Oral Daily   calcitRIOL  0.5 mcg Oral Daily   doxazosin  6 mg Oral QHS   furosemide  40 mg Oral Daily   heparin  5,000 Units Subcutaneous Q8H   hydrALAZINE  100 mg Oral TID   labetalol  300 mg Oral Q8H   potassium chloride SA  40 mEq Oral Daily    ampicillin-sulbactam (UNASYN) IV Stopped (02/15/22 0759)   acetaminophen **OR** acetaminophen, methocarbamol, ondansetron **OR** ondansetron (ZOFRAN) IV, oxyCODONE-acetaminophen

## 2022-02-15 NOTE — ED Triage Notes (Signed)
Patient arrived stating on Sunday he was fishing and came in contact with some trout. Has had increased left sided facial swelling since, unsure if he is having an allergic reaction to it. Declines any difficulty swallowing or breathing. No other symptoms at this time.

## 2022-02-15 NOTE — ED Provider Notes (Signed)
[facility] DEPT Provider Note   CSN: [id number] Arrival date & time: 02/15/22  0300     History  Chief Complaint  Patient presents with   Facial Swelling    Larry Clements is a 66 y.o. male who presents with concern for 3 days of progressively worsening left-sided facial and neck swelling that is painless.  He states that he ate Trout at a friend's house for dinner and began to notice some swelling later in the evening.  Suspected that maybe he was having an allergic reaction, however never had any itching, sore throat, itchy throat, trouble swallowing, shortness of breath, rash, and no history of allergic reaction in the past.  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  Patient currently following [facility] for his CKD and has been referred to transplant services.  Was referred to dialysis education classes as well but is not currently on dialysis.  HPI     Home Medications Prior to Admission medications   Medication Sig Start Date End Date Taking? Authorizing Provider  amLODipine (NORVASC) 10 MG tablet TAKE 1 TABLET BY MOUTH EVERY DAY 01/30/19   Elby Showers, MD  calcitRIOL (ROCALTROL) 0.5 MCG capsule Take 0.5 mcg by mouth daily. 03/17/19   [provider]  furosemide (LASIX) 40 MG tablet TAKE 1 TABLET (40 MG TOTAL) BY MOUTH DAILY. 09/03/16   Elby Showers, MD  hydrALAZINE (APRESOLINE) 50 MG tablet Take 50 mg by mouth 3 (three) times daily.    [provider]  KLOR-CON M20 20 MEQ tablet TAKE 2 TABLETS BY MOUTH EVERY DAY 03/19/18   Elby Showers, MD  labetalol (NORMODYNE) 300 MG tablet Take 300 mg by mouth every 8 (eight) hours. 08/28/16   [provider]  losartan (COZAAR) 100 MG tablet TAKE 1 TABLET BY MOUTH EVERY DAY 07/01/19   Elby Showers, MD  methocarbamol (ROBAXIN) 500 MG tablet Take 1 tablet (500 mg total) by mouth 2 (two) times daily as needed for muscle spasms.  01/07/22   Noemi Chapel, MD  naproxen (NAPROSYN) 500 MG tablet Take 1 tablet (500 mg total) by mouth 2 (two) times daily with a meal. 01/07/22   Noemi Chapel, MD  oxyCODONE-acetaminophen (PERCOCET/ROXICET) 5-325 MG tablet Take 1 tablet by mouth every 6 (six) hours as needed for severe pain. 11/19/20   Eustaquio Maize, PA-C      Allergies    No known allergies    Review of Systems   Review of Systems  HENT:  Positive for facial swelling.     Physical Exam Updated Vital Signs BP (!) 180/85   Pulse 65   Temp 98.1 F (36.7 C) (Oral)   Resp 17   Ht _0  (1.93 m)   Wt 113.4 kg   SpO2 97%   BMI 30.43 kg/m  Physical Exam Vitals and nursing note reviewed.  Constitutional:      Appearance: He is obese. He is not ill-appearing or toxic-appearing.  HENT:     Head: Normocephalic and atraumatic.      Nose: Nose normal.     Mouth/Throat:     Mouth: Mucous membranes are moist.     Pharynx: Oropharynx is clear. Uvula midline. No oropharyngeal exudate or posterior oropharyngeal erythema.     Tonsils: No tonsillar exudate or tonsillar abscesses.     Comments: No sublingual or submental tenderness palpation.  No evidence of oropharyngeal abscess on exam.  Uvula  is midline, normal phonation Eyes:     General:        Right eye: No discharge.        Left eye: No discharge.     Conjunctiva/sclera: Conjunctivae normal.  Neck:     Trachea: Trachea and phonation normal. No tracheal tenderness.  Cardiovascular:     Rate and Rhythm: Normal rate and regular rhythm.     Pulses: Normal pulses.     Heart sounds: Normal heart sounds. No murmur heard. Pulmonary:     Effort: Pulmonary effort is normal. No tachypnea, bradypnea, accessory muscle usage, prolonged expiration or respiratory distress.     Breath sounds: Normal breath sounds. No wheezing or rales.  Chest:     Chest wall: No mass, lacerations, deformity, swelling, tenderness, crepitus or edema.  Abdominal:     General: Bowel sounds are  normal. There is no distension.     Palpations: Abdomen is soft.     Tenderness: There is no abdominal tenderness. There is no guarding or rebound.  Musculoskeletal:        General: No deformity.     Cervical back: Neck supple.     Right lower leg: No edema.     Left lower leg: No edema.  Skin:    General: Skin is warm and dry.     Capillary Refill: Capillary refill takes less than 2 seconds.  Neurological:     General: No focal deficit present.     Mental Status: He is alert and oriented to person, place, and time. Mental status is at baseline.  Psychiatric:        Mood and Affect: Mood normal.     ED Results / Procedures / Treatments   Labs (all labs ordered are listed, but only abnormal results are displayed) Labs Reviewed  CBC WITH DIFFERENTIAL/PLATELET - Abnormal; Notable for the following components:      Result Value   RBC 3.06 (*)    Hemoglobin 8.6 (*)    HCT 27.4 (*)    Lymphs Abs 0.6 (*)    All other components within normal limits  BASIC METABOLIC PANEL - Abnormal; Notable for the following components:   Glucose, Bld 115 (*)    BUN 48 (*)    Creatinine, Ser 8.36 (*)    GFR, Estimated 7 (*)    All other components within normal limits    EKG None  Radiology CT Soft Tissue Neck Wo Contrast  Result Date: 02/15/2022 CLINICAL DATA:  Facial swelling on the left for 4 days, worsening. EXAM: CT NECK WITHOUT CONTRAST TECHNIQUE: Multidetector CT imaging of the neck was performed following the standard protocol without intravenous contrast. RADIATION DOSE REDUCTION: This exam was performed according to the departmental dose-optimization program which includes automated exposure control, adjustment of the mA and/or kV according to patient size and/or use of iterative reconstruction technique. COMPARISON:  None Available. FINDINGS: Pharynx and larynx: Submucosal edema in the left hypopharynx and supraglottic larynx. Patent airway. No opaque foreign body when allowing for  tiny left tonsillar calcifications. Salivary glands: The left parotid and submandibular glands appear enlarged and edematous, it is unclear if this is primary or secondary. Thyroid: 2.4 cm right-sided nodule. Lymph nodes: Expected mild enlargement of cervical lymph nodes without heterogeneity. Vascular: Mild atheromatous calcification Limited intracranial: Negative Visualized orbits: Negative Mastoids and visualized paranasal sinuses: Chronic appearing opacification in the inferior maxillary sinuses. No fluid levels. Skeleton: Cervical spine degeneration.  No acute finding Upper chest: Negative Other: Extensive soft  tissue swelling in the subcutaneous left neck tracking from the jaw to the neck base. IMPRESSION: 1. Extensive swelling in the left neck extending from the subcutaneous space to the left pharynx/supraglottic larynx. There is left parotiditis and submandibular sialadenitis but it is unclear if this is primary or secondary inflammation. No evidence of abscess. 2. 2.4 cm right thyroid nodule. Recommend thyroid US (ref: J Am Coll Radiol. 2015 Feb;12(2): 143-50). Electronically Signed   By: Jorje Guild M.D.   On: 02/15/2022 05:54    Procedures Procedures    Medications Ordered in ED Medications  methylPREDNISolone sodium succinate (SOLU-MEDROL) 125 mg/2 mL injection 125 mg (has no administration in time range)  Ampicillin-Sulbactam (UNASYN) 3 g in sodium chloride 0.9 % 100 mL IVPB (has no administration in time range)    ED Course/ Medical Decision Making/ A&P Clinical Course as of 02/15/22 0643  Wed Feb 15, 2022  0640 Antibiotic dosing discussed with pharmacist Larkin Ina who recommends 3 g of Unasyn every 12 hours.  I appreciate his collaboration in care of this patient. [RS]    Clinical Course User Index [RS] Floree Zuniga, Gypsy Balsam, PA-C                           Medical Decision Making 66 year old male presents with concern for facial and neck swelling.   Hypertensive on intake,  vitals otherwise normal.  Cardiopulmonary and abdominal exams are benign.  Patient with normal phonation without evidence of intraoral or pharyngeal abscess.  No signs of Ludwick's angina patient tolerating his own secretions well.  Facial edema on the left and edema extending down to the left anterior neck and lateral neck is exquisite, see CT images.  No tracheal deviation or tenderness, no hoarseness of voice.  Differential diagnosis includes but is not limited to parotitis, sialoadenitis, dental infection, Ludewig's angina, neoplasm, sialolithiasis, facial cellulitis, zoster.    Amount and/or Complexity of Data Reviewed Labs: ordered.    Details: CBC without leukocytosis, and with anemia with hemoglobin at his baseline at 8.6.  BMP with creatinine of 8.3 increased from patient's baseline of 6.6, however BUN remains flat and electrolytes are normal.  Radiology: ordered.    Details: CT soft tissue of the neck performed without contrast secondary to CKD.  Extensive swelling of the left neck from the subcutaneous space to the left pharynx/supraglottic larynx.  Left parotiditis and submandibular sialoadenitis, unclear if primary or secondary inflammation in these areas but no evidence of abscess in the neck.  2.5 cm thyroid nodule for which ultrasound is recommended. Images visulaized by this provider.   Risk Prescription drug management.   Patient evaluated bedside by Dr. Sedonia Small, [place].  Given degree of swelling and concern for infectious etiology of parotitis, will proceed with IV antibiosis in the emergency department and admission to the hospital for further stabilization.  Unasyn ordered per pharmacy dosage recommendation and patient to be admitted to the hospital.  EDP Dr. Sedonia Small to consult hospitalist. I appreciate his collaboration in the care of this patient.   Thayer Jew voiced understanding of her medical evaluation and treatment plan. Each of their questions answered to their expressed  satisfaction.  He is amenable to plan for admission at this time.   This chart was dictated using voice recognition software, Dragon. Despite the best efforts of this provider to proofread and correct errors, errors may still occur which can change documentation meaning.   Final Clinical Impression(s) / ED Diagnoses Final diagnoses:  None    Rx / DC Orders ED Discharge Orders     None         Aura Dials 02/15/22 5329    Maudie Flakes, MD 02/15/22 [phone]

## 2022-02-15 NOTE — H&P (Signed)
History and Physical    Patient: Larry Clements MVH:[id number] DOB: 08-09-1955 DOA: 02/15/2022 DOS: the patient was seen and examined on 02/15/2022 PCP: Elby Showers, MD  Patient coming from: Home  Chief Complaint:  Chief Complaint  Patient presents with   Facial Swelling   HPI: Larry Clements is a 66 y.o. 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  He initially thought that he was having an allergic reaction, but there was no other symptoms involved.  He has denied dyspnea, dysphagia/odynophagia, sore throat, hoarseness, wheezing, pruritus, abdominal pain, nausea or vomiting.  No fever, chills or night sweats. No rhinorrhea, productive cough or hemoptysis.  No chest pain, palpitations, diaphoresis, PND, orthopnea or pitting edema of the lower extremities.  No appetite changes, diarrhea, constipation, melena or hematochezia.  No flank pain, dysuria, frequency or hematuria.  No polyuria, polydipsia, polyphagia or blurred vision.  ED course: Initial vital signs were temperature 98.1 F, pulse 81, respirations 20, BP 190/79 mmHg O2 sat 98% on room air.  The patient received Unasyn 3 g IVPB and methylprednisolone 125 mg IVP x1.  Lab work: CBC showed a white count of 8.1, hemoglobin 8.6 g/dL platelets 216.  BMP with normal electrolytes.  Glucose 115, BUN 48 and creatinine 8.36 mg/dL.  Imaging: CT soft tissue of neck with contrast shows tensive edema in the left sided cervical area extending from the subcutaneous space of the left pharynx/supraglottic larynx.  There is left parotitis and submandibular sial adenitis but it is unclear if this is primary or secondary inflammation.  No evidence of abscess.   There was a 2.4 cm right thyroid nodule.  Thyroid ultrasound was recommended.   Review of Systems: As mentioned in the history of present illness. All other systems reviewed and are negative. 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 PLA2R staining +. Mod to severe arterionephrosclerosis, mod TI scarring, secondary focal and segmental tuft sclerosis   Past Surgical History:  Procedure Laterality Date   COLONOSCOPY     EUS N/A 09/21/2016   Procedure: UPPER ENDOSCOPIC ULTRASOUND (EUS) LINEAR;  Surgeon: Milus Banister, MD;  Location: [facility];  Service: Endoscopy;  Laterality: N/A;   LAPAROSCOPIC GASTRECTOMY N/A 10/26/2016   Procedure: LAPAROSCOPIC PARTIAL GASTRECTOMY;  Surgeon: Stark Klein, MD;  Location: [facility];  Service: General;  Laterality: N/A;   LAPAROSCOPIC PARTIAL GASTRECTOMY  10/26/2016   SURGICAL EXCISION OF EXCESSIVE SKIN     fatty tissue   Social History:  reports that he has never smoked. He has never used smokeless tobacco. He reports that he does not drink alcohol and does not use drugs.  Allergies  Allergen Reactions   No Known Allergies     Family History  Problem Relation Age of Onset   Diabetes Father    Kidney failure Father    Colon cancer Father    Hypertension Mother     Prior to Admission medications   Medication Sig Start Date End Date Taking? Authorizing Provider  acetaminophen (TYLENOL) 500 MG tablet Take  1,000 mg by mouth every 6 (six) hours as needed for mild pain.   Yes [provider]  amLODipine (NORVASC) 10 MG tablet TAKE 1 TABLET BY MOUTH EVERY DAY Patient taking differently: Take 10 mg by mouth daily. 01/30/19  Yes Baxley, Cresenciano Lick, MD  calcitRIOL (ROCALTROL) 0.5 MCG capsule Take 0.5 mcg by mouth daily. 03/17/19  Yes [provider]   doxazosin (CARDURA) 4 MG tablet Take 6 mg by mouth at bedtime. 01/04/22  Yes [provider]  furosemide (LASIX) 40 MG tablet TAKE 1 TABLET (40 MG TOTAL) BY MOUTH DAILY. 09/03/16  Yes Baxley, Cresenciano Lick, MD  hydrALAZINE (APRESOLINE) 100 MG tablet Take 100 mg by mouth 3 (three) times daily.   Yes [provider]  KLOR-CON M20 20 MEQ tablet TAKE 2 TABLETS BY MOUTH EVERY DAY Patient taking differently: 40 mEq daily. 03/19/18  Yes Baxley, Cresenciano Lick, MD  labetalol (NORMODYNE) 300 MG tablet Take 300 mg by mouth every 8 (eight) hours. 08/28/16  Yes [provider]  methocarbamol (ROBAXIN) 500 MG tablet Take 1 tablet (500 mg total) by mouth 2 (two) times daily as needed for muscle spasms. 01/07/22  Yes Noemi Chapel, MD  naproxen (NAPROSYN) 500 MG tablet Take 1 tablet (500 mg total) by mouth 2 (two) times daily with a meal. 01/07/22  Yes Noemi Chapel, MD  oxyCODONE-acetaminophen (PERCOCET/ROXICET) 5-325 MG tablet Take 1 tablet by mouth every 6 (six) hours as needed for severe pain. 11/19/20  Yes Venter, [name], PA-C  telmisartan (MICARDIS) 80 MG tablet Take 80 mg by mouth daily. 12/25/21  Yes [provider]    Physical Exam: Vitals:   02/15/22 0630 02/15/22 0700 02/15/22 0728 02/15/22 0845  BP: (!) 174/91 (!) 175/93  (!) 172/90  Pulse: 65 72  70  Resp: 18   17  Temp:   98.3 F (36.8 C)   TempSrc:   Oral   SpO2: 100% 97%  97%  Weight:      Height:       Physical Exam Vitals and nursing note reviewed.  Constitutional:      General: He is awake. He is not in acute distress.    Appearance: He is obese. He is not ill-appearing.  HENT:     Head: Normocephalic.     Nose: No rhinorrhea.     Mouth/Throat:     Mouth: Mucous membranes are moist.     Tongue: Tongue does not deviate from midline.     Pharynx: Uvula midline.  Eyes:     General: No scleral icterus.    Pupils: Pupils are equal, round, and reactive to light.  Neck:     Vascular: No JVD.     Trachea:  Phonation normal.     Comments: Left-sided cervical painless edema. Cardiovascular:     Rate and Rhythm: Normal rate and regular rhythm.     Heart sounds: S1 normal and S2 normal.  Pulmonary:     Effort: Pulmonary effort is normal.     Breath sounds: No wheezing, rhonchi or rales.  Abdominal:     General: Bowel sounds are normal. There is no distension.     Palpations: Abdomen is soft.     Tenderness: There is no abdominal tenderness.  Musculoskeletal:     Cervical back: Neck supple. No rigidity. Normal range of motion.     Right lower leg: No edema.     Left lower leg: No edema.  Lymphadenopathy:     Cervical: Cervical adenopathy  present.     Left cervical: Superficial cervical adenopathy and posterior cervical adenopathy present.  Skin:    General: Skin is warm and dry.  Neurological:     General: No focal deficit present.     Mental Status: He is alert and oriented to person, place, and time.  Psychiatric:        Mood and Affect: Mood normal.        Behavior: Behavior normal. Behavior is cooperative.   Data Reviewed:  There are no new results to review at this time.  Assessment and Plan: Principal Problem:   Parotiditis Admit to telemetry as/inpatient. Analgesics as needed. Continue Unasyn 3 g every 12 hours IVPB. Monitor airway/swallowing closely. Defer further glucocorticoids for now. Will reevaluate further need for steroids tomorrow AM.  Active Problems:   ESRD (end stage renal disease) ([place]) Associated with:   Anemia in ESRD (end-stage renal disease) ([place]) Currently not on hemodialysis. Creatinine has increased from 6.6 to 8.36 mg/dL in recent months. Continue calcitriol 0.5 mg p.o. daily. Holding ARB at the moment. Continue furosemide and K supplementation. Monitor hematocrit and hemoglobin. Erythropoietin per nephrology.    Thyroid nodule Check thyroid ultrasound. Further work up pending results    Hypertension Continue amlodipine 10 mg p.o. daily.    Continue doxazosin 6 mg p.o. bedtime. Continue labetalol 300 mg every 8 hours. Continue hydralazine 100 mg p.o. 3 times daily. Continue furosemide 40 mg p.o. at noon time. Continue potassium supplementation with furosemide. Hold ARB pending nephrology consultation. Monitor BP, HR, renal function electrolytes.    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[place]) Continue yearly surveillance scans and follow-ups with oncology.    Class 1 obesity Current BMI 30.43 kg/m. Continue lifestyle modifications.    Advance Care Planning:   Code Status: Full Code   Consults: Nephrology Roney Jaffe, MD)  Family Communication:   Severity of Illness: The appropriate patient status for this patient is INPATIENT. Inpatient status is judged to be reasonable and necessary in order to provide the required intensity of service to ensure the patient's safety. The patient's presenting symptoms, physical exam findings, and initial radiographic and laboratory data in the context of their chronic comorbidities is felt to place them at high risk for further clinical deterioration. Furthermore, it is not anticipated that the patient will be medically stable for discharge from the hospital within 2 midnights of admission.   * I certify that at the point of admission it is my clinical judgment that the patient will require inpatient hospital care spanning beyond 2 midnights from the point of admission due to high intensity of service, high risk for further deterioration and high frequency of surveillance required.*  Author: Reubin Milan, MD 02/15/2022 9:08 AM  For on call review [email or web address].   This document was prepared using Dragon voice recognition software and may contain some unintended transcription errors.

## 2022-02-16 ENCOUNTER — Encounter (HOSPITAL_COMMUNITY): Payer: Self-pay

## 2022-02-16 ENCOUNTER — Ambulatory Visit: Payer: BC Managed Care – PPO | Admitting: Physical Therapy

## 2022-02-16 ENCOUNTER — Telehealth: Payer: Self-pay | Admitting: Physical Therapy

## 2022-02-16 DIAGNOSIS — K1120 Sialoadenitis, unspecified: Secondary | ICD-10-CM | POA: Diagnosis not present

## 2022-02-16 DIAGNOSIS — E669 Obesity, unspecified: Secondary | ICD-10-CM

## 2022-02-16 DIAGNOSIS — N186 End stage renal disease: Secondary | ICD-10-CM | POA: Diagnosis not present

## 2022-02-16 LAB — COMPREHENSIVE METABOLIC PANEL
ALT: 20 U/L (ref 0–44)
AST: 18 U/L (ref 15–41)
Albumin: 3.3 g/dL — ABNORMAL LOW (ref 3.5–5.0)
Alkaline Phosphatase: 52 U/L (ref 38–126)
Anion gap: 9 (ref 5–15)
BUN: 63 mg/dL — ABNORMAL HIGH (ref 8–23)
CO2: 20 mmol/L — ABNORMAL LOW (ref 22–32)
Calcium: 9 mg/dL (ref 8.9–10.3)
Chloride: 106 mmol/L (ref 98–111)
Creatinine, Ser: 8.91 mg/dL — ABNORMAL HIGH (ref 0.61–1.24)
GFR, Estimated: 6 mL/min — ABNORMAL LOW (ref >60)
Glucose, Bld: 134 mg/dL — ABNORMAL HIGH (ref 70–99)
Potassium: 4.3 mmol/L (ref 3.5–5.1)
Sodium: 135 mmol/L (ref 135–145)
Total Bilirubin: 0.5 mg/dL (ref 0.3–1.2)
Total Protein: 6.4 g/dL — ABNORMAL LOW (ref 6.5–8.1)

## 2022-02-16 LAB — CBC
HCT: 25.5 % — ABNORMAL LOW (ref 39.0–52.0)
Hemoglobin: 7.8 g/dL — ABNORMAL LOW (ref 13.0–17.0)
MCH: 27.6 pg (ref 26.0–34.0)
MCHC: 30.6 g/dL (ref 30.0–36.0)
MCV: 90.1 fL (ref 80.0–100.0)
Platelets: 224 10*3/uL (ref 150–400)
RBC: 2.83 MIL/uL — ABNORMAL LOW (ref 4.22–5.81)
RDW: 14.8 % (ref 11.5–15.5)
WBC: 11.2 10*3/uL — ABNORMAL HIGH (ref 4.0–10.5)
nRBC: 0 % (ref 0.0–0.2)

## 2022-02-16 LAB — HIV ANTIBODY (ROUTINE TESTING W REFLEX): HIV Screen 4th Generation wRfx: NONREACTIVE

## 2022-02-16 MED ORDER — AMOXICILLIN-POT CLAVULANATE 875-125 MG PO TABS: 1.0000 | ORAL_TABLET | Freq: Two times a day (BID) | ORAL | 0 refills | Status: AC

## 2022-02-16 NOTE — Telephone Encounter (Addendum)
Called pt regarding today's appointment given hospitalization - pt answered, advised that we would need to cancel current appointments and will require new referral to resume PT. Pt verbalizes understanding and states he will follow up with MD after discharge    Physician Note: Patient remain hospitalized with parotitis and renal failure. We can see him after discharge and re-evaluate shoulder porblem

## 2022-02-16 NOTE — Discharge Summary (Signed)
Physician Discharge Summary   Patient: Larry Clements MRN: [id number] DOB: Jan 17, 1956  Admit date:     02/15/2022  Discharge date: 02/16/22  Discharge Physician: Marylu Lund   PCP: Elby Showers, MD   Recommendations at discharge:    Follow up with PCP in 1-2 weeks Recommendation for annual/biennial thyroid US to follow up on R nodule until stability x 64yr confirmed  Discharge Diagnoses: Principal Problem:   Parotiditis Active Problems:   Hypertension   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[place]   Anemia in ESRD (end-stage renal disease) ([place]   Class 1 obesity   Thyroid nodule   ESRD (end stage renal disease) ([place]  Resolved Problems:   * No resolved hospital problems. *  Hospital Course: 66y.o. 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  He initially thought that he was having an allergic reaction, but there was no other symptoms involved.  He has denied dyspnea, dysphagia/odynophagia, sore throat, hoarseness, wheezing, pruritus, abdominal pain, nausea or vomiting.  No fever, chills or night sweats. No rhinorrhea, productive cough or hemoptysis.  No chest pain, palpitations, diaphoresis, PND, orthopnea or pitting edema of the lower extremities.  No appetite changes, diarrhea, constipation, melena or hematochezia.  No flank pain, dysuria, frequency or hematuria.  No polyuria, polydipsia, polyphagia or blurred vision.   ED course: Initial vital signs were temperature 98.1 F, pulse 81, respirations 20, BP 190/79 mmHg O2 sat 98% on room air.  The patient received Unasyn 3 g IVPB and methylprednisolone 125 mg IVP x1  Assessment and Plan: Principal Problem:    Parotiditis Initially continued on Unasyn 3 g every 12 hours IVPB. Pt reports swelling much improved overnight to essentially baseline Tolerated PO without any issues. No issues with breathing Prescribed 10 days of augmentin to complete course of abx   Active Problems:   ESRD (end stage renal disease) (HCC) Associated with:   Anemia in ESRD (end-stage renal disease) ([place] Currently not on hemodialysis. Creatinine has increased from 6.6 to 8.36 mg/dL in recent months. Continue calcitriol 0.5 mg p.o. daily. Seen by Nephrology with no indication for HD this visit By day of d/c pt was clear for d/c per Nephrology with close outpt f/u.     Thyroid nodule Thyroid [place]viewed Recommendation for annual/biennial UK[place]af/u of R nodule until stability x 525yrconfirmed     Hypertension Continued amlodipine 10 mg p.o. daily.   Continued doxazosin 6 mg p.o. bedtime. Continued labetalol 300 mg every 8 hours. Continued hydralazine 100 mg p.o. 3 times daily. Continued furosemide 40 mg p.o. at noon time. Continued potassium supplementation with furosemide. Held ARB initially, ok to resume per Nephrology Cont home meds on d/c     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[place] yearly surveillance scans and follow-ups with oncology.     Class 1 obesity Current BMI 30.43 kg/m. Continue lifestyle modifications.       Consultants: Nephrology Procedures performed:   Disposition: Home Diet recommendation:  Renal diet DISCHARGE MEDICATION: Allergies as of 02/16/2022       Reactions   No Known Allergies         Medication List     STOP taking  these medications    naproxen 500 MG tablet Commonly known as: Naprosyn       TAKE these medications    acetaminophen 500 MG tablet Commonly known as: TYLENOL Take 1,000 mg by mouth every 6 (six) hours as needed for mild pain.   amLODipine 10 MG tablet Commonly known as: NORVASC TAKE 1 TABLET BY MOUTH EVERY  DAY   amoxicillin-clavulanate 875-125 MG tablet Commonly known as: AUGMENTIN Take 1 tablet by mouth 2 (two) times daily for 10 days.   calcitRIOL 0.5 MCG capsule Commonly known as: ROCALTROL Take 0.5 mcg by mouth daily.   doxazosin 4 MG tablet Commonly known as: CARDURA Take 6 mg by mouth at bedtime.   furosemide 40 MG tablet Commonly known as: LASIX TAKE 1 TABLET (40 MG TOTAL) BY MOUTH DAILY.   hydrALAZINE 100 MG tablet Commonly known as: APRESOLINE Take 100 mg by mouth 3 (three) times daily.   Klor-Con M20 20 MEQ tablet Generic drug: potassium chloride SA TAKE 2 TABLETS BY MOUTH EVERY DAY What changed:  how much to take how to take this   labetalol 300 MG tablet Commonly known as: NORMODYNE Take 300 mg by mouth every 8 (eight) hours.   methocarbamol 500 MG tablet Commonly known as: ROBAXIN Take 1 tablet (500 mg total) by mouth 2 (two) times daily as needed for muscle spasms.   oxyCODONE-acetaminophen 5-325 MG tablet Commonly known as: PERCOCET/ROXICET Take 1 tablet by mouth every 6 (six) hours as needed for severe pain.   telmisartan 80 MG tablet Commonly known as: MICARDIS Take 80 mg by mouth daily.        Follow-up Information     Baxley, Cresenciano Lick, MD Follow up in 2 week(s).   Specialty: Internal Medicine Why: Hospital follow up Contact information: [address] [place] [address] [phone]                Discharge Exam: Danley Danker Weights   02/15/22 0306 02/16/22 0741  Weight: 113.4 kg 116.5 kg   General exam: Awake, laying in bed, in nad Respiratory system: Normal respiratory effort, no wheezing Cardiovascular system: regular rate, s1, s2 Gastrointestinal system: Soft, nondistended, positive BS Central nervous system: CN2-12 grossly intact, strength intact Extremities: Perfused, no clubbing Skin: Normal skin turgor, no notable skin lesions seen Psychiatry: Mood normal // no visual hallucinations   Condition at discharge:  fair  The results of significant diagnostics from this hospitalization (including imaging, microbiology, ancillary and laboratory) are listed below for reference.   Imaging Studies: US THYROID  Result Date: 02/15/2022 CLINICAL DATA:  Right nodule noted on CT neck EXAM: THYROID ULTRASOUND TECHNIQUE: Ultrasound examination of the thyroid gland and adjacent soft tissues was performed. COMPARISON:  CT from earlier the same day FINDINGS: Parenchymal Echotexture: Mildly heterogenous Isthmus: 0.5 cm thickness Right lobe: 5.8 x 2.5 x 2.4 cm Left lobe: 5.4 x 1.4 x 1.8 cm _________________________________________________________ Estimated total number of nodules >/= 1 cm: 1 Number of spongiform nodules >/=  2 cm not described below (TR1): 0 Number of mixed cystic and solid nodules >/= 1.5 cm not described below (TR2): 0 _________________________________________________________ Nodule # 1: Location: Right; mid Maximum size: 2.2 cm; Other 2 dimensions: 2 x 2.1 cm Composition: mixed cystic and solid (1) Echogenicity: hypoechoic (2) Shape: not taller-than-wide (0) Margins: smooth (0) Echogenic foci: none (0) ACR TI-RADS total points: 3. ACR TI-RADS risk category: TR 3. ACR TI-RADS recommendations: *Given size (>/= 1.5 - 2.4 cm) and appearance, a follow-up ultrasound in  1 year should be considered based on TI-RADS criteria. _________________________________________________________ Nodule 2: 0.8 cm complex cyst, mid left; This nodule does NOT meet TI-RADS criteria for biopsy or dedicated follow-up. IMPRESSION: 1. Thyromegaly with bilateral cystic nodules. Neither meets criteria for biopsy. 2. Recommend annual/biennial ultrasound follow-up of right nodule as above, until stability x5 years confirmed. The above is in keeping with the ACR TI-RADS recommendations - J Am Coll Radiol 2017;14:587-595. Electronically Signed   By: Lucrezia Europe M.D.   On: 02/15/2022 09:25   CT Soft Tissue Neck Wo Contrast  Result Date:  02/15/2022 CLINICAL DATA:  Facial swelling on the left for 4 days, worsening. EXAM: CT NECK WITHOUT CONTRAST TECHNIQUE: Multidetector CT imaging of the neck was performed following the standard protocol without intravenous contrast. RADIATION DOSE REDUCTION: This exam was performed according to the departmental dose-optimization program which includes automated exposure control, adjustment of the mA and/or kV according to patient size and/or use of iterative reconstruction technique. COMPARISON:  None Available. FINDINGS: Pharynx and larynx: Submucosal edema in the left hypopharynx and supraglottic larynx. Patent airway. No opaque foreign body when allowing for tiny left tonsillar calcifications. Salivary glands: The left parotid and submandibular glands appear enlarged and edematous, it is unclear if this is primary or secondary. Thyroid: 2.4 cm right-sided nodule. Lymph nodes: Expected mild enlargement of cervical lymph nodes without heterogeneity. Vascular: Mild atheromatous calcification Limited intracranial: Negative Visualized orbits: Negative Mastoids and visualized paranasal sinuses: Chronic appearing opacification in the inferior maxillary sinuses. No fluid levels. Skeleton: Cervical spine degeneration.  No acute finding Upper chest: Negative Other: Extensive soft tissue swelling in the subcutaneous left neck tracking from the jaw to the neck base. IMPRESSION: 1. Extensive swelling in the left neck extending from the subcutaneous space to the left pharynx/supraglottic larynx. There is left parotiditis and submandibular sialadenitis but it is unclear if this is primary or secondary inflammation. No evidence of abscess. 2. 2.4 cm right thyroid nodule. Recommend thyroid US (ref: J Am Coll Radiol. 2015 Feb;12(2): 143-50). Electronically Signed   By: Jorje Guild M.D.   On: 02/15/2022 05:54    Microbiology: Results for orders placed or performed in visit on 05/09/16  Urine culture     Status: None    Collection Time: 05/09/16 12:06 PM   Specimen: Urine  Result Value Ref Range Status   Organism ID, Bacteria NO GROWTH  Final    Labs: CBC: Recent Labs  Lab 02/15/22 0358 02/16/22 0655  WBC 8.1 11.2*  NEUTROABS 6.4  --   HGB 8.6* 7.8*  HCT 27.4* 25.5*  MCV 89.5 90.1  PLT 216 161   Basic Metabolic Panel: Recent Labs  Lab 02/15/22 0358 02/16/22 0655  NA 135 135  K 4.0 4.3  CL 105 106  CO2 22 20*  GLUCOSE 115* 134*  BUN 48* 63*  CREATININE 8.36* 8.91*  CALCIUM 9.0 9.0   Liver Function Tests: Recent Labs  Lab 02/16/22 0655  AST 18  ALT 20  ALKPHOS 52  BILITOT 0.5  PROT 6.4*  ALBUMIN 3.3*   CBG: No results for input(s): "GLUCAP" in the last 168 hours.  Discharge time spent: less than 30 minutes.  Signed: Marylu Lund, MD [facility] 02/16/2022

## 2022-02-16 NOTE — TOC Transition Note (Signed)
Transition of Care [facility]) - CM/SW Discharge Note   Patient Details  Name: Larry Clements MRN: [id number] Date of Birth: 05/01/1956  Transition of Care [facility]) CM/SW Contact:  Leeroy Cha, RN Phone Number: 02/16/2022, 2:21 PM   Clinical Narrative:     Discharged to return home with self care.  No toc needs present.  Final next level of care: Home/Self Care Barriers to Discharge: Barriers Resolved   Patient Goals and CMS Choice Patient states their goals for this hospitalization and ongoing recovery are:: to go home      Discharge Placement                       Discharge Plan and Services   Discharge Planning Services: CM Consult                                 Social Determinants of Health (SDOH) Interventions     Readmission Risk Interventions   No data to display

## 2022-02-16 NOTE — Progress Notes (Signed)
[facility] Progress Note  Subjective: seen in room, BP's stable 150/ 75, labs pending.   Vitals:   02/15/22 2146 02/15/22 2147 02/16/22 0248 02/16/22 0626  BP: (!) 172/89  (!) 156/77 (!) 166/74  Pulse: 79 79 86 74  Resp: 16  18   Temp: 97.7 F (36.5 C)  99.1 F (37.3 C)   TempSrc: Oral  Oral   SpO2:  96% 95%   Weight:      Height:        Exam: Gen alert, no distress No rash, cyanosis or gangrene Sclera anicteric, throat clear  No jvd or bruits Chest clear bilat to bases, no rales/ wheezing RRR no MRG Abd soft ntnd no mass or ascites +bs GU normal male MS no joint effusions or deformity Ext no LE or UE edema, no wounds or ulcers Neuro is alert, Ox 3 , nf      Home meds include - amlodipine 10, doxazosin 6 hs, furosemide 40 qd, klorcon 40 qd, labetalol 300 tid, naproxen 500 bid prn, oxycodone/ aceta prn, telmisartan 30 qd, prns/ vits/ supps     Date               Creat               eGFR   2013- 17        1.45- 1.72           2018              1.50- 2.30           2019              2.36- 2.51   2020              2.55- 3.12   2021              3.07- 3.16   2022              3.37                 20 ml/min, stage IV   10/06/21          6.60                 9 ml/min, stage V    01/25/22  7.35  [facility] office labs   02/15/22        8.36                 7 ml/min                Na 135  K 4  BUN 48  Cr 8.36   Ca 9.0   Hb 8.6      BP's 140- 189/ 80- 95, HR 70s  RR 18  afeb         Assessment/ Plan: CKD 5 - b/l creat 6.6 from may 2023, eGFR 9 ml/min. Renal function has been progressively worsening this year and pt is approaching dialysis but is not uremic at this time. Pt recently seen in our office early September and recommended to see vasc surgery for HD access placement but he refused. Today pt is stable clinically and creat is in mid 8's.  No uremic symptoms, no need for RRT at this time. OK for dc from renal standpoint. Will sign off.  HTN - continue 4 home BP  meds and po lasix 40 daily Volume - euvolemic on exam, getting lasix  40 qd Parotiditis/ sialadenitis - getting IV abx , much better Dispo - per pmd  prob going today     Rob Patryck Kilgore 02/16/2022, 7:39 AM   Recent Labs  Lab 02/15/22 0358 02/16/22 0655  HGB 8.6* 7.8*  CALCIUM 9.0  --   CREATININE 8.36*  --   K 4.0  --    Recent Labs  Lab 02/09/22 1054  IRON 61  TIBC 344  FERRITIN 89   Inpatient medications:  amLODipine  10 mg Oral Daily   calcitRIOL  0.5 mcg Oral Daily   doxazosin  6 mg Oral QHS   furosemide  40 mg Oral Daily   heparin  5,000 Units Subcutaneous Q8H   hydrALAZINE  100 mg Oral TID   irbesartan  300 mg Oral Daily   labetalol  300 mg Oral Q8H   potassium chloride SA  40 mEq Oral Daily    sodium chloride 10 mL/hr at 02/15/22 2033   ampicillin-sulbactam (UNASYN) IV 3 g (02/15/22 2040)   sodium chloride, acetaminophen **OR** acetaminophen, methocarbamol, ondansetron **OR** ondansetron (ZOFRAN) IV, oxyCODONE-acetaminophen

## 2022-02-16 NOTE — Hospital Course (Signed)
65 y.o. 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  He initially thought that he was having an allergic reaction, but there was no other symptoms involved.  He has denied dyspnea, dysphagia/odynophagia, sore throat, hoarseness, wheezing, pruritus, abdominal pain, nausea or vomiting.  No fever, chills or night sweats. No rhinorrhea, productive cough or hemoptysis.  No chest pain, palpitations, diaphoresis, PND, orthopnea or pitting edema of the lower extremities.  No appetite changes, diarrhea, constipation, melena or hematochezia.  No flank pain, dysuria, frequency or hematuria.  No polyuria, polydipsia, polyphagia or blurred vision.   ED course: Initial vital signs were temperature 98.1 F, pulse 81, respirations 20, BP 190/79 mmHg O2 sat 98% on room air.  The patient received Unasyn 3 g IVPB and methylprednisolone 125 mg IVP x1

## 2022-02-16 NOTE — TOC Initial Note (Signed)
Transition of Care [facility]) - Initial/Assessment Note    Patient Details  Name: Larry Clements MRN: [id number] Date of Birth: 1955/10/31  [facility]) CM/SW Contact:    Leeroy Cha, RN Phone Number: 02/16/2022, 2:20 PM  Clinical Narrative:                  Transition of Care [facility]) Screening Note   Patient Details  Name: Larry Clements Date of Birth: 14-Feb-1956   Transition of Care [facility]) CM/SW Contact:    Leeroy Cha, RN Phone Number: 02/16/2022, 2:20 PM    Transition of Care Department [facility]) has reviewed patient and no TOC needs have been identified at this time. We will continue to monitor patient advancement through interdisciplinary progression rounds. If new patient transition needs arise, please place a TOC consult.    Expected Discharge Plan: Home/Self Care Barriers to Discharge: Barriers Resolved   Patient Goals and CMS Choice Patient states their goals for this hospitalization and ongoing recovery are:: to go home      Expected Discharge Plan and Services Expected Discharge Plan: Home/Self Care   Discharge Planning Services: CM Consult   Living arrangements for the past 2 months: Single Family Home Expected Discharge Date: 02/16/22                                    Prior Living Arrangements/Services Living arrangements for the past 2 months: Single Family Home Lives with:: Self Patient language and need for interpreter reviewed:: Yes Do you feel safe going back to the place where you live?: Yes            Criminal Activity/Legal Involvement Pertinent to Current Situation/Hospitalization: No - Comment as needed  Activities of Daily Living Home Assistive Devices/Equipment: None ADL Screening (condition at time of admission) Patient's cognitive ability adequate to safely complete daily activities?: Yes Is the patient deaf or have difficulty hearing?: No Does the patient have difficulty seeing, even when  wearing glasses/contacts?: No Does the patient have difficulty concentrating, remembering, or making decisions?: No Patient able to express need for assistance with ADLs?: Yes Does the patient have difficulty dressing or bathing?: No Independently performs ADLs?: Yes (appropriate for developmental age) Does the patient have difficulty walking or climbing stairs?: No Weakness of Legs: None Weakness of Arms/Hands: Left  Permission Sought/Granted                  Emotional Assessment Appearance:: Appears stated age Attitude/Demeanor/Rapport: Engaged Affect (typically observed): Calm Orientation: : Oriented to Situation, Oriented to  Time, Oriented to Place, Oriented to Self Alcohol / Substance Use: Not Applicable Psych Involvement: No (comment)  Admission diagnosis:  Parotiditis [K11.20] Parotitis [K11.20] Patient Active Problem List   Diagnosis Date Noted   Parotiditis 02/15/2022   Anemia in ESRD (end-stage renal disease) ([place]) 02/15/2022   Class 1 obesity 02/15/2022   Thyroid nodule 02/15/2022   ESRD (end stage renal disease) ([place]) 02/15/2022   Chronic kidney disease, stage III (moderate) ([place]) 07/11/2017   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  [place] [address] Phone: [phone] Fax: [phone]     Social Determinants of Health (SDOH) Interventions    Readmission Risk Interventions   No data to display

## 2022-02-16 NOTE — Progress Notes (Signed)
Provided patient with AVS discharge instructions, patient had no further questions upon discharge. 

## 2022-02-17 ENCOUNTER — Telehealth: Payer: Self-pay | Admitting: Internal Medicine

## 2022-02-17 NOTE — Telephone Encounter (Signed)
Larry Clements called and he was discharged from the hospital on 02/16/2022. I have scheduled him for hospital follow up on 02/21/2022. Need to do TOC today.

## 2022-02-20 ENCOUNTER — Telehealth: Payer: Self-pay

## 2022-02-20 DIAGNOSIS — Z0289 Encounter for other administrative examinations: Secondary | ICD-10-CM

## 2022-02-20 NOTE — Telephone Encounter (Signed)
Error

## 2022-02-21 ENCOUNTER — Encounter: Payer: Self-pay | Admitting: Internal Medicine

## 2022-02-21 ENCOUNTER — Encounter: Payer: BC Managed Care – PPO | Admitting: Physical Therapy

## 2022-02-21 ENCOUNTER — Ambulatory Visit (INDEPENDENT_AMBULATORY_CARE_PROVIDER_SITE_OTHER): Payer: BC Managed Care – PPO | Admitting: Internal Medicine

## 2022-02-21 VITALS — BP 130/80 | HR 67 | Temp 97.6°F | Ht 76.0 in | Wt 259.1 lb

## 2022-02-21 DIAGNOSIS — D631 Anemia in chronic kidney disease: Secondary | ICD-10-CM

## 2022-02-21 DIAGNOSIS — N186 End stage renal disease: Secondary | ICD-10-CM

## 2022-02-21 DIAGNOSIS — Z992 Dependence on renal dialysis: Secondary | ICD-10-CM

## 2022-02-21 DIAGNOSIS — E042 Nontoxic multinodular goiter: Secondary | ICD-10-CM | POA: Diagnosis not present

## 2022-02-21 DIAGNOSIS — Z8509 Personal history of malignant neoplasm of other digestive organs: Secondary | ICD-10-CM

## 2022-02-21 DIAGNOSIS — M19012 Primary osteoarthritis, left shoulder: Secondary | ICD-10-CM

## 2022-02-21 DIAGNOSIS — M25512 Pain in left shoulder: Secondary | ICD-10-CM | POA: Diagnosis not present

## 2022-02-21 DIAGNOSIS — N022 Recurrent and persistent hematuria with diffuse membranous glomerulonephritis: Secondary | ICD-10-CM

## 2022-02-21 DIAGNOSIS — K1120 Sialoadenitis, unspecified: Secondary | ICD-10-CM

## 2022-02-21 DIAGNOSIS — I151 Hypertension secondary to other renal disorders: Secondary | ICD-10-CM

## 2022-02-21 DIAGNOSIS — M542 Cervicalgia: Secondary | ICD-10-CM

## 2022-02-21 DIAGNOSIS — N185 Chronic kidney disease, stage 5: Secondary | ICD-10-CM
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
## 2022-02-21 NOTE — Telephone Encounter (Signed)
Referral has been placed. 

## 2022-02-22 LAB — BASIC METABOLIC PANEL
BUN/Creatinine Ratio: 7 (calc) (ref 6–22)
BUN: 56 mg/dL — ABNORMAL HIGH (ref 7–25)
CO2: 22 mmol/L (ref 20–32)
Calcium: 9.1 mg/dL (ref 8.6–10.3)
Chloride: 106 mmol/L (ref 98–110)
Creat: 8.24 mg/dL — ABNORMAL HIGH (ref 0.70–1.35)
Glucose, Bld: 81 mg/dL (ref 65–99)
Potassium: 5 mmol/L (ref 3.5–5.3)
Sodium: 138 mmol/L (ref 135–146)

## 2022-02-22 LAB — CBC WITH DIFFERENTIAL/PLATELET
Absolute Monocytes: 433 cells/uL (ref 200–950)
Basophils Absolute: 39 cells/uL (ref 0–200)
Basophils Relative: 1 %
Eosinophils Absolute: 129 cells/uL (ref 15–500)
Eosinophils Relative: 3.3 %
HCT: 26.8 % — ABNORMAL LOW (ref 38.5–50.0)
Hemoglobin: 8.5 g/dL — ABNORMAL LOW (ref 13.2–17.1)
Lymphs Abs: 523 cells/uL — ABNORMAL LOW (ref 850–3900)
MCH: 27.7 pg (ref 27.0–33.0)
MCHC: 31.7 g/dL — ABNORMAL LOW (ref 32.0–36.0)
MCV: 87.3 fL (ref 80.0–100.0)
MPV: 10.5 fL (ref 7.5–12.5)
Monocytes Relative: 11.1 %
Neutro Abs: 2777 cells/uL (ref 1500–7800)
Neutrophils Relative %: 71.2 %
Platelets: 219 10*3/uL (ref 140–400)
RBC: 3.07 10*6/uL — ABNORMAL LOW (ref 4.20–5.80)
RDW: 13.9 % (ref 11.0–15.0)
Total Lymphocyte: 13.4 %
WBC: 3.9 10*3/uL (ref 3.8–10.8)

## 2022-02-23 ENCOUNTER — Ambulatory Visit: Payer: BC Managed Care – PPO | Admitting: Physical Therapy

## 2022-02-28 ENCOUNTER — Encounter: Payer: BC Managed Care – PPO | Admitting: Physical Therapy

## 2022-03-02 ENCOUNTER — Ambulatory Visit: Payer: BC Managed Care – PPO | Admitting: Physical Therapy

## 2022-03-08 ENCOUNTER — Ambulatory Visit: Payer: BC Managed Care – PPO | Attending: Internal Medicine

## 2022-03-08 DIAGNOSIS — M25611 Stiffness of right shoulder, not elsewhere classified: Secondary | ICD-10-CM

## 2022-03-08 DIAGNOSIS — R293 Abnormal posture: Secondary | ICD-10-CM | POA: Diagnosis present

## 2022-03-08 DIAGNOSIS — M542 Cervicalgia: Secondary | ICD-10-CM | POA: Diagnosis present

## 2022-03-08 DIAGNOSIS — M25612 Stiffness of left shoulder, not elsewhere classified: Secondary | ICD-10-CM
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
## 2022-03-08 NOTE — Addendum Note (Signed)
Addended by: Elby Showers on: 03/08/2022 06:28 PM   Modules accepted: Level of Service
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
## 2022-03-09 ENCOUNTER — Ambulatory Visit (HOSPITAL_COMMUNITY)
Admission: RE | Admit: 2022-03-09 | Discharge: 2022-03-09 | Disposition: A | Discharge status: Home / Self Care | Payer: BC Managed Care – PPO | Source: Ambulatory Visit | Attending: Nephrology | Admitting: Nephrology

## 2022-03-09 VITALS — BP 176/99 | HR 66 | Temp 97.4°F | Resp 16

## 2022-03-09 DIAGNOSIS — N183 Chronic kidney disease, stage 3 unspecified: Secondary | ICD-10-CM | POA: Diagnosis present

## 2022-03-09 LAB — IRON AND TIBC
Iron: 52 ug/dL (ref 45–182)
Saturation Ratios: 16 % — ABNORMAL LOW (ref 17.9–39.5)
TIBC: 336 ug/dL (ref 250–450)
UIBC: 284 ug/dL

## 2022-03-09 LAB — FERRITIN: Ferritin: 60 ng/mL (ref 24–336)

## 2022-03-09 MED ORDER — EPOETIN ALFA-EPBX 10000 UNIT/ML IJ SOLN
20000.0000 [IU] | INTRAMUSCULAR | Status: DC
  Administered 2022-03-09: 20000 [IU] via SUBCUTANEOUS

## 2022-03-09 MED ORDER — EPOETIN ALFA-EPBX 10000 UNIT/ML IJ SOLN
INTRAMUSCULAR | Status: AC
  Filled 2022-03-09: qty 2

## 2022-03-09 NOTE — Progress Notes (Signed)
Pt here for retacrit injection.  HGB 7.7 via hemocue.  Pt states no bleeding, shortness of breath or chest pain.  Office called with results, spoke with [employer].  Per Dr Joelyn Oms - give injection as ordered and they will discuss with Dr Hollie Salk when she returns for any change in treatment.  Pt made aware of plan

## 2022-03-10 LAB — POCT HEMOGLOBIN-HEMACUE: Hemoglobin: 7.7 g/dL — ABNORMAL LOW (ref 13.0–17.0)

## 2022-03-17 ENCOUNTER — Encounter: Payer: Self-pay | Admitting: Internal Medicine

## 2022-03-17 ENCOUNTER — Ambulatory Visit: Payer: BC Managed Care – PPO | Admitting: Internal Medicine

## 2022-03-17 VITALS — BP 132/74 | HR 84 | Temp 97.7°F | Ht 76.0 in | Wt 258.4 lb

## 2022-03-17 DIAGNOSIS — M19012 Primary osteoarthritis, left shoulder: Secondary | ICD-10-CM

## 2022-03-17 DIAGNOSIS — K1120 Sialoadenitis, unspecified: Secondary | ICD-10-CM | POA: Diagnosis not present

## 2022-03-17 DIAGNOSIS — N022 Recurrent and persistent hematuria with diffuse membranous glomerulonephritis: Secondary | ICD-10-CM

## 2022-03-17 DIAGNOSIS — Z8509 Personal history of malignant neoplasm of other digestive organs: Secondary | ICD-10-CM | POA: Diagnosis not present

## 2022-03-17 DIAGNOSIS — I151 Hypertension secondary to other renal disorders: Secondary | ICD-10-CM

## 2022-03-17 DIAGNOSIS — D631 Anemia in chronic kidney disease: Secondary | ICD-10-CM

## 2022-03-17 DIAGNOSIS — N185 Chronic kidney disease, stage 5: Secondary | ICD-10-CM
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
## 2022-03-27 ENCOUNTER — Telehealth: Payer: Self-pay | Admitting: Internal Medicine

## 2022-03-27 NOTE — Telephone Encounter (Signed)
Also let patient know he needs to let PT know and cancel appointments with them.

## 2022-03-27 NOTE — Telephone Encounter (Signed)
Patient called to say he would not be going to PT any more because his insurance was not going to be paying anymore.

## 2022-03-29 ENCOUNTER — Ambulatory Visit: Payer: BC Managed Care – PPO

## 2022-04-07 ENCOUNTER — Encounter (HOSPITAL_COMMUNITY): Payer: Self-pay

## 2022-04-07 ENCOUNTER — Inpatient Hospital Stay (HOSPITAL_COMMUNITY)
Admission: RE | Admit: 2022-04-07 | Discharge: 2022-04-07 | Disposition: A | Discharge status: Home / Self Care | Payer: BC Managed Care – PPO | Source: Ambulatory Visit | Attending: Nephrology | Admitting: Nephrology

## 2022-04-08 NOTE — Patient Instructions (Addendum)
Patient will see if he can return to PT for shoulder pain. Continue medications per [facility] for hypertension.

## 2022-04-21 ENCOUNTER — Encounter (HOSPITAL_COMMUNITY): Payer: BC Managed Care – PPO

## 2022-06-15 DIAGNOSIS — N2581 Secondary hyperparathyroidism of renal origin: Secondary | ICD-10-CM | POA: Diagnosis not present

## 2022-06-15 DIAGNOSIS — N185 Chronic kidney disease, stage 5: Secondary | ICD-10-CM | POA: Diagnosis not present

## 2022-06-15 DIAGNOSIS — N189 Chronic kidney disease, unspecified: Secondary | ICD-10-CM | POA: Diagnosis not present

## 2022-06-15 DIAGNOSIS — I120 Hypertensive chronic kidney disease with stage 5 chronic kidney disease or end stage renal disease: Secondary | ICD-10-CM | POA: Diagnosis not present

## 2022-06-15 DIAGNOSIS — D631 Anemia in chronic kidney disease: Secondary | ICD-10-CM | POA: Diagnosis not present

## 2022-06-15 LAB — BASIC METABOLIC PANEL
BUN: 64 — AB (ref 4–21)
CO2: 14 (ref 13–22)
Chloride: 110 — AB (ref 99–108)
Creatinine: 8 — AB (ref 0.6–1.3)
Glucose: 95
Potassium: 4.6 mEq/L (ref 3.5–5.1)
Sodium: 141 (ref 137–147)

## 2022-06-15 LAB — COMPREHENSIVE METABOLIC PANEL
Albumin: 4 (ref 3.5–5.0)
Calcium: 9 (ref 8.7–10.7)
eGFR: 7

## 2022-06-15 LAB — CBC AND DIFFERENTIAL
HCT: 21 — AB (ref 41–53)
Hemoglobin: 6.7 — AB (ref 13.5–17.5)
Platelets: 201 10*3/uL (ref 150–400)
WBC: 4.9

## 2022-06-15 LAB — IRON,TIBC AND FERRITIN PANEL
%SAT: 21
Ferritin: 94
Iron: 60
TIBC: 291
UIBC: 231

## 2022-06-15 LAB — VITAMIN D 25 HYDROXY (VIT D DEFICIENCY, FRACTURES): Vit D, 25-Hydroxy: 18.9

## 2022-06-15 LAB — CBC: RBC: 2.41 — AB (ref 3.87–5.11)

## 2022-06-27 ENCOUNTER — Encounter: Payer: Self-pay | Admitting: Internal Medicine

## 2022-06-29 ENCOUNTER — Other Ambulatory Visit (HOSPITAL_COMMUNITY): Payer: Self-pay

## 2022-06-29 DIAGNOSIS — D631 Anemia in chronic kidney disease: Secondary | ICD-10-CM

## 2022-06-30 ENCOUNTER — Ambulatory Visit (HOSPITAL_COMMUNITY)
Admission: RE | Admit: 2022-06-30 | Discharge: 2022-06-30 | Disposition: A | Discharge status: Home / Self Care | Payer: BC Managed Care – PPO | Source: Ambulatory Visit | Attending: Nephrology | Admitting: Nephrology

## 2022-06-30 VITALS — BP 169/85 | HR 83 | Temp 97.0°F | Resp 17

## 2022-06-30 DIAGNOSIS — D631 Anemia in chronic kidney disease: Secondary | ICD-10-CM | POA: Diagnosis not present

## 2022-06-30 DIAGNOSIS — N183 Chronic kidney disease, stage 3 unspecified: Secondary | ICD-10-CM | POA: Insufficient documentation

## 2022-06-30 LAB — POCT HEMOGLOBIN-HEMACUE: Hemoglobin: 6.6 g/dL — CL (ref 13.0–17.0)

## 2022-06-30 MED ORDER — EPOETIN ALFA-EPBX 10000 UNIT/ML IJ SOLN: 20000.0000 [IU] | INTRAMUSCULAR | Status: DC

## 2022-06-30 MED ORDER — EPOETIN ALFA-EPBX 10000 UNIT/ML IJ SOLN
INTRAMUSCULAR | Status: AC
  Administered 2022-06-30: 20000 [IU] via SUBCUTANEOUS
  Filled 2022-06-30: qty 2

## 2022-06-30 NOTE — Progress Notes (Signed)
Hemocue 6.6 today.  Was scheduled for blood today but patient wanted to just take the retacrit today and see how it does.  Called [facility] today and reported result to amber and no new orders received, just give current dose of retacrit ordered.  Instructed patient if he started to have any symptoms to call [facility]

## 2022-07-14 ENCOUNTER — Encounter (HOSPITAL_COMMUNITY)
Admission: RE | Admit: 2022-07-14 | Discharge: 2022-07-14 | Disposition: A | Discharge status: Home / Self Care | Payer: Medicare HMO | Source: Ambulatory Visit | Attending: Nephrology | Admitting: Nephrology

## 2022-07-14 ENCOUNTER — Encounter (HOSPITAL_COMMUNITY): Payer: Self-pay

## 2022-07-14 VITALS — BP 160/87 | HR 70 | Temp 97.3°F | Resp 17

## 2022-07-14 DIAGNOSIS — D631 Anemia in chronic kidney disease: Secondary | ICD-10-CM | POA: Insufficient documentation

## 2022-07-14 DIAGNOSIS — N186 End stage renal disease: Secondary | ICD-10-CM | POA: Diagnosis not present

## 2022-07-14 DIAGNOSIS — N183 Chronic kidney disease, stage 3 unspecified: Secondary | ICD-10-CM | POA: Diagnosis not present

## 2022-07-14 DIAGNOSIS — N185 Chronic kidney disease, stage 5: Secondary | ICD-10-CM | POA: Diagnosis present

## 2022-07-14 LAB — IRON AND TIBC
Iron: 78 ug/dL (ref 45–182)
Saturation Ratios: 21 % (ref 17.9–39.5)
TIBC: 365 ug/dL (ref 250–450)
UIBC: 287 ug/dL

## 2022-07-14 LAB — POCT HEMOGLOBIN-HEMACUE: Hemoglobin: 6.4 g/dL — CL (ref 13.0–17.0)

## 2022-07-14 LAB — FERRITIN: Ferritin: 40 ng/mL (ref 24–336)

## 2022-07-14 MED ORDER — EPOETIN ALFA-EPBX 10000 UNIT/ML IJ SOLN
INTRAMUSCULAR | Status: AC
  Filled 2022-07-14: qty 2

## 2022-07-14 MED ORDER — EPOETIN ALFA-EPBX 10000 UNIT/ML IJ SOLN
20000.0000 [IU] | INTRAMUSCULAR | Status: DC
  Administered 2022-07-14: 20000 [IU] via SUBCUTANEOUS

## 2022-07-14 NOTE — Progress Notes (Signed)
Pt here for retacrit injection.  HGB 6.4 via hemocue. PT c/o of shortness of breath with ambulation but no bleeding, chest pain or dark stools.  Pt still does not want blood transfusion as of yet. He is wanting to give this new retacrit dosage a chance to work.  Office notified of all of the above and instructed to proceed with injection.  Pt instructed to go to ED if any new symptoms or if his shortness of breath worsens.  Also instructed him to call if he decides that he wants to schedule the blood transfusion that has been ordered.

## 2022-07-28 ENCOUNTER — Inpatient Hospital Stay (HOSPITAL_COMMUNITY): Admission: RE | Admit: 2022-07-28 | Payer: BC Managed Care – PPO | Source: Ambulatory Visit

## 2022-07-28 ENCOUNTER — Encounter (HOSPITAL_COMMUNITY)
Admission: RE | Admit: 2022-07-28 | Discharge: 2022-07-28 | Disposition: A | Discharge status: Home / Self Care | Payer: Medicare HMO | Source: Ambulatory Visit | Attending: Nephrology | Admitting: Nephrology

## 2022-07-28 VITALS — BP 156/75 | HR 72 | Temp 97.4°F | Resp 18 | Wt 254.0 lb

## 2022-07-28 DIAGNOSIS — N183 Chronic kidney disease, stage 3 unspecified: Secondary | ICD-10-CM

## 2022-07-28 DIAGNOSIS — D631 Anemia in chronic kidney disease: Secondary | ICD-10-CM

## 2022-07-28 DIAGNOSIS — N185 Chronic kidney disease, stage 5: Secondary | ICD-10-CM | POA: Diagnosis not present

## 2022-07-28 DIAGNOSIS — N186 End stage renal disease: Secondary | ICD-10-CM | POA: Diagnosis not present

## 2022-07-28 LAB — POCT HEMOGLOBIN-HEMACUE: Hemoglobin: 6.6 g/dL — CL (ref 13.0–17.0)

## 2022-07-28 LAB — ABO/RH: ABO/RH(D): A POS

## 2022-07-28 LAB — PREPARE RBC (CROSSMATCH)

## 2022-07-28 MED ORDER — EPOETIN ALFA-EPBX 10000 UNIT/ML IJ SOLN
INTRAMUSCULAR | Status: AC
  Filled 2022-07-28: qty 2

## 2022-07-28 MED ORDER — EPOETIN ALFA-EPBX 10000 UNIT/ML IJ SOLN
20000.0000 [IU] | INTRAMUSCULAR | Status: DC
  Administered 2022-07-28: 20000 [IU] via SUBCUTANEOUS

## 2022-07-28 MED ORDER — SODIUM CHLORIDE 0.9% IV SOLUTION: Freq: Once | INTRAVENOUS | Status: DC

## 2022-07-29 LAB — TYPE AND SCREEN
ABO/RH(D): A POS
Antibody Screen: NEGATIVE
Unit division: 0

## 2022-07-29 LAB — BPAM RBC
Blood Product Expiration Date: 202404042359
ISSUE DATE / TIME: 202403151046
Unit Type and Rh: 6200

## 2022-08-11 ENCOUNTER — Encounter (HOSPITAL_COMMUNITY)
Admission: RE | Admit: 2022-08-11 | Discharge: 2022-08-11 | Disposition: A | Discharge status: Home / Self Care | Payer: Medicare HMO | Source: Ambulatory Visit | Attending: Nephrology | Admitting: Nephrology

## 2022-08-11 VITALS — BP 176/84 | HR 79 | Temp 97.9°F | Resp 18

## 2022-08-11 DIAGNOSIS — N183 Chronic kidney disease, stage 3 unspecified: Secondary | ICD-10-CM | POA: Diagnosis not present

## 2022-08-11 DIAGNOSIS — N185 Chronic kidney disease, stage 5: Secondary | ICD-10-CM | POA: Diagnosis not present

## 2022-08-11 DIAGNOSIS — D631 Anemia in chronic kidney disease: Secondary | ICD-10-CM | POA: Diagnosis not present

## 2022-08-11 DIAGNOSIS — N186 End stage renal disease: Secondary | ICD-10-CM | POA: Diagnosis not present

## 2022-08-11 LAB — POCT HEMOGLOBIN-HEMACUE: Hemoglobin: 7.6 g/dL — ABNORMAL LOW (ref 13.0–17.0)

## 2022-08-11 MED ORDER — EPOETIN ALFA-EPBX 10000 UNIT/ML IJ SOLN: 20000.0000 [IU] | INTRAMUSCULAR | Status: DC

## 2022-08-11 MED ORDER — EPOETIN ALFA-EPBX 10000 UNIT/ML IJ SOLN
INTRAMUSCULAR | Status: AC
  Administered 2022-08-11: 20000 [IU] via SUBCUTANEOUS
  Filled 2022-08-11: qty 2

## 2022-08-11 NOTE — Progress Notes (Signed)
Dr patel called back and no new orders given.

## 2022-08-16 DIAGNOSIS — Z8249 Family history of ischemic heart disease and other diseases of the circulatory system: Secondary | ICD-10-CM | POA: Diagnosis not present

## 2022-08-16 DIAGNOSIS — Z841 Family history of disorders of kidney and ureter: Secondary | ICD-10-CM | POA: Diagnosis not present

## 2022-08-16 DIAGNOSIS — I120 Hypertensive chronic kidney disease with stage 5 chronic kidney disease or end stage renal disease: Secondary | ICD-10-CM | POA: Diagnosis not present

## 2022-08-16 DIAGNOSIS — E669 Obesity, unspecified: Secondary | ICD-10-CM | POA: Diagnosis not present

## 2022-08-16 DIAGNOSIS — Z6830 Body mass index (BMI) 30.0-30.9, adult: Secondary | ICD-10-CM | POA: Diagnosis not present

## 2022-08-16 DIAGNOSIS — Z008 Encounter for other general examination: Secondary | ICD-10-CM | POA: Diagnosis not present

## 2022-08-16 DIAGNOSIS — N185 Chronic kidney disease, stage 5: Secondary | ICD-10-CM | POA: Diagnosis not present

## 2022-08-25 ENCOUNTER — Encounter (HOSPITAL_COMMUNITY)
Admission: RE | Admit: 2022-08-25 | Discharge: 2022-08-25 | Disposition: A | Discharge status: Home / Self Care | Payer: Medicare HMO | Source: Ambulatory Visit | Attending: Nephrology | Admitting: Nephrology

## 2022-08-25 VITALS — BP 177/83 | HR 73 | Temp 97.9°F | Resp 18

## 2022-08-25 DIAGNOSIS — N185 Chronic kidney disease, stage 5: Secondary | ICD-10-CM | POA: Diagnosis present

## 2022-08-25 DIAGNOSIS — D631 Anemia in chronic kidney disease: Secondary | ICD-10-CM | POA: Insufficient documentation

## 2022-08-25 DIAGNOSIS — N183 Chronic kidney disease, stage 3 unspecified: Secondary | ICD-10-CM

## 2022-08-25 LAB — IRON AND TIBC
Iron: 66 ug/dL (ref 45–182)
Saturation Ratios: 18 % (ref 17.9–39.5)
TIBC: 375 ug/dL (ref 250–450)
UIBC: 309 ug/dL

## 2022-08-25 LAB — FERRITIN: Ferritin: 29 ng/mL (ref 24–336)

## 2022-08-25 LAB — POCT HEMOGLOBIN-HEMACUE: Hemoglobin: 7.9 g/dL — ABNORMAL LOW (ref 13.0–17.0)

## 2022-08-25 MED ORDER — EPOETIN ALFA-EPBX 10000 UNIT/ML IJ SOLN
20000.0000 [IU] | INTRAMUSCULAR | Status: DC
  Administered 2022-08-25: 20000 [IU] via SUBCUTANEOUS

## 2022-08-25 MED ORDER — EPOETIN ALFA-EPBX 10000 UNIT/ML IJ SOLN
INTRAMUSCULAR | Status: AC
  Filled 2022-08-25: qty 2

## 2022-09-08 ENCOUNTER — Encounter (HOSPITAL_COMMUNITY): Payer: BC Managed Care – PPO

## 2022-09-13 DIAGNOSIS — D631 Anemia in chronic kidney disease: Secondary | ICD-10-CM | POA: Diagnosis not present

## 2022-09-13 DIAGNOSIS — I120 Hypertensive chronic kidney disease with stage 5 chronic kidney disease or end stage renal disease: Secondary | ICD-10-CM | POA: Diagnosis not present

## 2022-09-13 DIAGNOSIS — R809 Proteinuria, unspecified: Secondary | ICD-10-CM | POA: Diagnosis not present

## 2022-09-13 DIAGNOSIS — N185 Chronic kidney disease, stage 5: Secondary | ICD-10-CM | POA: Diagnosis not present

## 2022-09-14 LAB — LAB REPORT - SCANNED
Creatinine, POC: 105.2 mg/dL
EGFR: 5
Microalb Creat Ratio: 537

## 2022-09-15 ENCOUNTER — Encounter (HOSPITAL_COMMUNITY)
Admission: RE | Admit: 2022-09-15 | Discharge: 2022-09-15 | Disposition: A | Discharge status: Home / Self Care | Payer: Medicare HMO | Source: Ambulatory Visit | Attending: Nephrology | Admitting: Nephrology

## 2022-09-15 VITALS — BP 163/76 | HR 71 | Temp 97.3°F | Resp 17

## 2022-09-15 DIAGNOSIS — N183 Chronic kidney disease, stage 3 unspecified: Secondary | ICD-10-CM | POA: Diagnosis not present

## 2022-09-15 LAB — POCT HEMOGLOBIN-HEMACUE: Hemoglobin: 7.6 g/dL — ABNORMAL LOW (ref 13.0–17.0)

## 2022-09-15 MED ORDER — EPOETIN ALFA-EPBX 10000 UNIT/ML IJ SOLN
INTRAMUSCULAR | Status: AC
  Filled 2022-09-15: qty 2

## 2022-09-15 MED ORDER — EPOETIN ALFA-EPBX 10000 UNIT/ML IJ SOLN
20000.0000 [IU] | INTRAMUSCULAR | Status: DC
  Administered 2022-09-15: 20000 [IU] via SUBCUTANEOUS

## 2022-09-15 NOTE — Progress Notes (Signed)
Dr Beaulah Corin office notified of hemocue 7.6 and ok to give retacrit

## 2022-09-22 ENCOUNTER — Encounter (HOSPITAL_COMMUNITY)
Admission: RE | Admit: 2022-09-22 | Discharge: 2022-09-22 | Disposition: A | Discharge status: Home / Self Care | Payer: Medicare HMO | Source: Ambulatory Visit | Attending: Nephrology | Admitting: Nephrology

## 2022-09-22 VITALS — BP 156/79 | HR 70 | Temp 97.7°F | Resp 17

## 2022-09-22 DIAGNOSIS — N183 Chronic kidney disease, stage 3 unspecified: Secondary | ICD-10-CM | POA: Diagnosis not present

## 2022-09-22 LAB — FERRITIN: Ferritin: 33 ng/mL (ref 24–336)

## 2022-09-22 LAB — IRON AND TIBC
Iron: 36 ug/dL — ABNORMAL LOW (ref 45–182)
Saturation Ratios: 9 % — ABNORMAL LOW (ref 17.9–39.5)
TIBC: 389 ug/dL (ref 250–450)
UIBC: 353 ug/dL

## 2022-09-22 LAB — POCT HEMOGLOBIN-HEMACUE: Hemoglobin: 7.6 g/dL — ABNORMAL LOW (ref 13.0–17.0)

## 2022-09-22 MED ORDER — EPOETIN ALFA-EPBX 10000 UNIT/ML IJ SOLN
INTRAMUSCULAR | Status: AC
  Filled 2022-09-22: qty 2

## 2022-09-22 MED ORDER — EPOETIN ALFA-EPBX 10000 UNIT/ML IJ SOLN
20000.0000 [IU] | INTRAMUSCULAR | Status: DC
  Administered 2022-09-22: 20000 [IU] via SUBCUTANEOUS

## 2022-09-22 NOTE — Progress Notes (Signed)
Pt here today for retacrit shot but here a week too soon.  Called [facility], spoke to [facility], and she stated to go ahead with his treatment today since his hemocue has been low and have him come back in two weeks.  Hemocue today was 7.6 and no other orders received when I reported result to [facility] at [facility].

## 2022-09-26 ENCOUNTER — Other Ambulatory Visit: Payer: Self-pay | Admitting: *Deleted

## 2022-09-26 DIAGNOSIS — N186 End stage renal disease: Secondary | ICD-10-CM

## 2022-09-29 ENCOUNTER — Encounter (HOSPITAL_COMMUNITY): Payer: BC Managed Care – PPO

## 2022-10-02 ENCOUNTER — Ambulatory Visit (HOSPITAL_COMMUNITY): Payer: Medicare HMO | Attending: Vascular Surgery

## 2022-10-02 ENCOUNTER — Ambulatory Visit (HOSPITAL_COMMUNITY): Payer: Medicare HMO

## 2022-10-06 ENCOUNTER — Encounter (HOSPITAL_COMMUNITY)
Admission: RE | Admit: 2022-10-06 | Discharge: 2022-10-06 | Disposition: A | Discharge status: Home / Self Care | Payer: Medicare HMO | Source: Ambulatory Visit | Attending: Nephrology | Admitting: Nephrology

## 2022-10-06 VITALS — BP 176/84 | HR 77 | Temp 97.3°F | Resp 17

## 2022-10-06 DIAGNOSIS — N183 Chronic kidney disease, stage 3 unspecified: Secondary | ICD-10-CM | POA: Diagnosis not present

## 2022-10-06 LAB — POCT HEMOGLOBIN-HEMACUE: Hemoglobin: 7.7 g/dL — ABNORMAL LOW (ref 13.0–17.0)

## 2022-10-06 MED ORDER — EPOETIN ALFA-EPBX 10000 UNIT/ML IJ SOLN
INTRAMUSCULAR | Status: AC
  Filled 2022-10-06: qty 2

## 2022-10-06 MED ORDER — EPOETIN ALFA-EPBX 10000 UNIT/ML IJ SOLN
20000.0000 [IU] | INTRAMUSCULAR | Status: DC
  Administered 2022-10-06: 20000 [IU] via SUBCUTANEOUS

## 2022-10-13 ENCOUNTER — Encounter: Payer: Medicare HMO | Admitting: Vascular Surgery

## 2022-10-20 ENCOUNTER — Encounter (HOSPITAL_COMMUNITY)
Admission: RE | Admit: 2022-10-20 | Discharge: 2022-10-20 | Disposition: A | Discharge status: Home / Self Care | Payer: Medicare HMO | Source: Ambulatory Visit | Attending: Nephrology | Admitting: Nephrology

## 2022-10-20 VITALS — BP 175/96 | HR 81 | Temp 97.0°F | Resp 18

## 2022-10-20 DIAGNOSIS — N183 Chronic kidney disease, stage 3 unspecified: Secondary | ICD-10-CM | POA: Insufficient documentation

## 2022-10-20 LAB — IRON AND TIBC
Iron: 47 ug/dL (ref 45–182)
Saturation Ratios: 13 % — ABNORMAL LOW (ref 17.9–39.5)
TIBC: 368 ug/dL (ref 250–450)
UIBC: 321 ug/dL

## 2022-10-20 LAB — FERRITIN: Ferritin: 61 ng/mL (ref 24–336)

## 2022-10-20 LAB — POCT HEMOGLOBIN-HEMACUE: Hemoglobin: 7.7 g/dL — ABNORMAL LOW (ref 13.0–17.0)

## 2022-10-20 MED ORDER — EPOETIN ALFA-EPBX 10000 UNIT/ML IJ SOLN: 20000.0000 [IU] | INTRAMUSCULAR | Status: DC

## 2022-10-20 MED ORDER — EPOETIN ALFA-EPBX 10000 UNIT/ML IJ SOLN
INTRAMUSCULAR | Status: AC
  Administered 2022-10-20: 20000 [IU] via SUBCUTANEOUS
  Filled 2022-10-20: qty 2

## 2022-11-03 ENCOUNTER — Encounter (HOSPITAL_COMMUNITY)
Admission: RE | Admit: 2022-11-03 | Discharge: 2022-11-03 | Disposition: A | Discharge status: Home / Self Care | Payer: Medicare HMO | Source: Ambulatory Visit | Attending: Nephrology | Admitting: Nephrology

## 2022-11-03 VITALS — BP 180/97 | HR 77 | Temp 97.1°F | Resp 17

## 2022-11-03 DIAGNOSIS — N183 Chronic kidney disease, stage 3 unspecified: Secondary | ICD-10-CM | POA: Diagnosis not present

## 2022-11-03 LAB — POCT HEMOGLOBIN-HEMACUE: Hemoglobin: 7.6 g/dL — ABNORMAL LOW (ref 13.0–17.0)

## 2022-11-03 MED ORDER — EPOETIN ALFA-EPBX 10000 UNIT/ML IJ SOLN
INTRAMUSCULAR | Status: AC
  Filled 2022-11-03: qty 2

## 2022-11-03 MED ORDER — EPOETIN ALFA-EPBX 10000 UNIT/ML IJ SOLN
20000.0000 [IU] | INTRAMUSCULAR | Status: DC
  Administered 2022-11-03: 20000 [IU] via SUBCUTANEOUS

## 2022-11-03 NOTE — Progress Notes (Signed)
Associate Professor at [employer] of HGB 7.6, patient denies any symptoms. Per [facility] we should proceed with orders.

## 2022-11-17 ENCOUNTER — Encounter (HOSPITAL_COMMUNITY): Payer: Self-pay

## 2022-11-17 ENCOUNTER — Inpatient Hospital Stay (HOSPITAL_COMMUNITY)
Admission: RE | Admit: 2022-11-17 | Discharge: 2022-11-17 | Disposition: A | Discharge status: Home / Self Care | Payer: BC Managed Care – PPO | Source: Ambulatory Visit | Attending: Nephrology | Admitting: Nephrology

## 2022-12-01 ENCOUNTER — Encounter (HOSPITAL_COMMUNITY): Payer: Self-pay

## 2022-12-01 ENCOUNTER — Encounter (HOSPITAL_COMMUNITY): Payer: BC Managed Care – PPO

## 2022-12-01 ENCOUNTER — Ambulatory Visit (HOSPITAL_COMMUNITY)
Admission: RE | Admit: 2022-12-01 | Discharge: 2022-12-01 | Disposition: A | Discharge status: Home / Self Care | Payer: BC Managed Care – PPO | Source: Ambulatory Visit | Attending: Nephrology | Admitting: Nephrology

## 2022-12-01 ENCOUNTER — Emergency Department (HOSPITAL_COMMUNITY)
Admission: EM | Admit: 2022-12-01 | Discharge: 2022-12-01 | Disposition: A | Discharge status: Home / Self Care | Payer: BC Managed Care – PPO | Attending: Emergency Medicine | Admitting: Emergency Medicine

## 2022-12-01 ENCOUNTER — Other Ambulatory Visit: Payer: Self-pay

## 2022-12-01 VITALS — BP 145/81 | HR 66 | Temp 97.3°F | Resp 17

## 2022-12-01 DIAGNOSIS — N183 Chronic kidney disease, stage 3 unspecified: Secondary | ICD-10-CM

## 2022-12-01 DIAGNOSIS — D649 Anemia, unspecified: Secondary | ICD-10-CM

## 2022-12-01 DIAGNOSIS — I120 Hypertensive chronic kidney disease with stage 5 chronic kidney disease or end stage renal disease: Secondary | ICD-10-CM | POA: Diagnosis not present

## 2022-12-01 DIAGNOSIS — N186 End stage renal disease: Secondary | ICD-10-CM | POA: Diagnosis not present

## 2022-12-01 DIAGNOSIS — D631 Anemia in chronic kidney disease: Secondary | ICD-10-CM | POA: Insufficient documentation

## 2022-12-01 DIAGNOSIS — R799 Abnormal finding of blood chemistry, unspecified: Secondary | ICD-10-CM | POA: Diagnosis not present

## 2022-12-01 DIAGNOSIS — N189 Chronic kidney disease, unspecified: Secondary | ICD-10-CM | POA: Insufficient documentation

## 2022-12-01 LAB — IRON AND TIBC
Iron: 54 ug/dL (ref 45–182)
Saturation Ratios: 16 % — ABNORMAL LOW (ref 17.9–39.5)
TIBC: 344 ug/dL (ref 250–450)
UIBC: 290 ug/dL

## 2022-12-01 LAB — BASIC METABOLIC PANEL
Anion gap: 12 (ref 5–15)
BUN: 94 mg/dL — ABNORMAL HIGH (ref 8–23)
CO2: 16 mmol/L — ABNORMAL LOW (ref 22–32)
Calcium: 9.5 mg/dL (ref 8.9–10.3)
Chloride: 108 mmol/L (ref 98–111)
Creatinine, Ser: 13.11 mg/dL — ABNORMAL HIGH (ref 0.61–1.24)
GFR, Estimated: 4 mL/min — ABNORMAL LOW (ref >60)
Glucose, Bld: 98 mg/dL (ref 70–99)
Potassium: 4.6 mmol/L (ref 3.5–5.1)
Sodium: 136 mmol/L (ref 135–145)

## 2022-12-01 LAB — CBC WITH DIFFERENTIAL/PLATELET
Abs Immature Granulocytes: 0.01 10*3/uL (ref 0.00–0.07)
Basophils Absolute: 0 10*3/uL (ref 0.0–0.1)
Basophils Relative: 1 %
Eosinophils Absolute: 0.7 10*3/uL — ABNORMAL HIGH (ref 0.0–0.5)
Eosinophils Relative: 18 %
HCT: 17.9 % — ABNORMAL LOW (ref 39.0–52.0)
Hemoglobin: 5.6 g/dL — CL (ref 13.0–17.0)
Immature Granulocytes: 0 %
Lymphocytes Relative: 10 %
Lymphs Abs: 0.4 10*3/uL — ABNORMAL LOW (ref 0.7–4.0)
MCH: 26.9 pg (ref 26.0–34.0)
MCHC: 31.3 g/dL (ref 30.0–36.0)
MCV: 86.1 fL (ref 80.0–100.0)
Monocytes Absolute: 0.3 10*3/uL (ref 0.1–1.0)
Monocytes Relative: 8 %
Neutro Abs: 2.5 10*3/uL (ref 1.7–7.7)
Neutrophils Relative %: 63 %
Platelets: 167 10*3/uL (ref 150–400)
RBC: 2.08 MIL/uL — ABNORMAL LOW (ref 4.22–5.81)
RDW: 16.3 % — ABNORMAL HIGH (ref 11.5–15.5)
WBC: 3.9 10*3/uL — ABNORMAL LOW (ref 4.0–10.5)
nRBC: 0 % (ref 0.0–0.2)

## 2022-12-01 LAB — FERRITIN: Ferritin: 114 ng/mL (ref 24–336)

## 2022-12-01 LAB — TYPE AND SCREEN: Antibody Screen: NEGATIVE

## 2022-12-01 LAB — POCT HEMOGLOBIN-HEMACUE: Hemoglobin: 5.2 g/dL — CL (ref 13.0–17.0)

## 2022-12-01 LAB — BPAM RBC
ISSUE DATE / TIME: 202407191326
Unit Type and Rh: 6200

## 2022-12-01 LAB — PREPARE RBC (CROSSMATCH)

## 2022-12-01 MED ORDER — SODIUM CHLORIDE 0.9% IV SOLUTION: Freq: Once | INTRAVENOUS | Status: AC

## 2022-12-01 MED ORDER — EPOETIN ALFA-EPBX 40000 UNIT/ML IJ SOLN: 40000.0000 [IU] | INTRAMUSCULAR | Status: DC

## 2022-12-01 NOTE — ED Triage Notes (Signed)
Pt to ED c/o abnormal lab today, accompanied with daughter.. Reports was told hgb 5.5. denies dizziness, weakness. Pt A&O X 4 in triage.

## 2022-12-01 NOTE — ED Notes (Addendum)
Blood administration initiated. RN stayed with patient for 1st 15 min of transfusion. VSS. No transfusion reaction suspected. Patient and family state they will call out if condition changes. Will continue to monitor.

## 2022-12-01 NOTE — Discharge Instructions (Addendum)
It was recommend that you be admitted to start dialysis. You were also given a blood transfusion today. Follow up closely with Dr. Signe Colt, as well as the Vascular Surgery office.   If you develop new or worsening shortness of breath, if you get chest pain, dizziness, weakness, or any other new/concerning symptoms, then return to the ER or call 911.

## 2022-12-01 NOTE — ED Notes (Signed)
No evidence of adverse reaction. Patient denies complaints. Will continue to monitor.

## 2022-12-01 NOTE — ED Provider Triage Note (Signed)
Emergency Medicine Provider Triage Evaluation Note  Carvel Huskins , a 67 y.o. male  was evaluated in triage.  Pt complains of abnormal lab.  Review of Systems  Positive:  Negative:   Physical Exam  BP (!) 151/76 (BP Location: Right Arm)   Pulse 72   Temp 97.9 F (36.6 C) (Oral)   Resp 18   Ht 6\' 4"  (1.93 m)   Wt 114.3 kg   SpO2 97%   BMI 30.67 kg/m  Gen:   Awake, no distress   Resp:  Normal effort  MSK:   Moves extremities without difficulty  Other:    Medical Decision Making  Medically screening exam initiated at 12:03 PM.  Appropriate orders placed.  Roslyn Smiling Riches was informed that the remainder of the evaluation will be completed by another provider, this initial triage assessment does not replace that evaluation, and the importance of remaining in the ED until their evaluation is complete.  HBG usually around 7.5. States that Hbg chronically low d/t kidney disease. Missed appointment get shot for chronic anemia. Reports Hbg at 5.5 today and patient was sent here for blood transfusion. Denies dyspnea, weakness, dizziness, or any other new symptoms.    Valrie Hart F, [place] 12/01/22 1210

## 2022-12-01 NOTE — ED Provider Notes (Signed)
[facility] EMERGENCY DEPARTMENT AT [facility] Provider Note   CSN: [id number] Arrival date & time: 12/01/22  1113     History  Chief Complaint  Patient presents with   Abnormal Lab    Larry Clements is a 67 y.o. male.  HPI 67 year old male with CKD and chronic anemia presents with a hemoglobin of 5.5. He missed his July 4 Retacrit injection. For the past 1-2 weeks he's been more short of breath than usual. He has not had any chest pain. No bleeding, including no rectal bleeding/melena. He went to get a Retacrit injection today, and his hemoglobin was low so he was referred here for a transfusion.   Home Medications Prior to Admission medications   Medication Sig Start Date End Date Taking? Authorizing Provider  acetaminophen (TYLENOL) 500 MG tablet Take 1,000 mg by mouth every 6 (six) hours as needed for mild pain.    [provider]  amLODipine (NORVASC) 10 MG tablet TAKE 1 TABLET BY MOUTH EVERY DAY Patient taking differently: Take 10 mg by mouth daily. 01/30/19   [name], MD  calcitRIOL (ROCALTROL) 0.5 MCG capsule Take 0.5 mcg by mouth daily. 03/17/19   [provider]  doxazosin (CARDURA) 4 MG tablet Take 6 mg by mouth at bedtime. 01/04/22   [provider]  furosemide (LASIX) 40 MG tablet TAKE 1 TABLET (40 MG TOTAL) BY MOUTH DAILY. 09/03/16   [name], MD  hydrALAZINE (APRESOLINE) 100 MG tablet Take 100 mg by mouth 3 (three) times daily.    [provider]  KLOR-CON M20 20 MEQ tablet TAKE 2 TABLETS BY MOUTH EVERY DAY Patient taking differently: 40 mEq daily. 03/19/18   [name], MD  labetalol (NORMODYNE) 300 MG tablet Take 300 mg by mouth every 8 (eight) hours. 08/28/16   [provider]  methocarbamol (ROBAXIN) 500 MG tablet Take 1 tablet (500 mg total) by mouth 2 (two) times daily as needed for muscle spasms. 01/07/22   [name], MD  oxyCODONE-acetaminophen (PERCOCET/ROXICET) 5-325 MG tablet Take 1  tablet by mouth every 6 (six) hours as needed for severe pain. 11/19/20   [name], [name], PA-C  telmisartan (MICARDIS) 80 MG tablet Take 80 mg by mouth daily. 12/25/21   [provider]      Allergies    No known allergies    Review of Systems   Review of Systems  HENT:  Negative for nosebleeds.   Respiratory:  Positive for shortness of breath.   Cardiovascular:  Negative for chest pain.  Gastrointestinal:  Negative for blood in stool.  Genitourinary:  Negative for decreased urine volume and difficulty urinating.    Physical Exam Updated Vital Signs BP (!) 158/88   Pulse (!) 57   Temp 97.9 F (36.6 C) (Oral)   Resp 13   Ht 6\' 4"  (1.93 m)   Wt 114.3 kg   SpO2 95%   BMI 30.67 kg/m  Physical Exam Vitals and nursing note reviewed.  Constitutional:      General: He is not in acute distress.    Appearance: He is well-developed. He is not ill-appearing or diaphoretic.  HENT:     Head: Normocephalic and atraumatic.  Cardiovascular:     Rate and Rhythm: Normal rate and regular rhythm.     Heart sounds: Normal heart sounds.  Pulmonary:     Effort: Pulmonary effort is normal.     Breath sounds: Normal breath sounds.  Abdominal:  Palpations: Abdomen is soft.     Tenderness: There is no abdominal tenderness.  Skin:    General: Skin is warm and dry.  Neurological:     Mental Status: He is alert.     ED Results / Procedures / Treatments   Labs (all labs ordered are listed, but only abnormal results are displayed) Labs Reviewed  CBC WITH DIFFERENTIAL/PLATELET - Abnormal; Notable for the following components:      Result Value   WBC 3.9 (*)    RBC 2.08 (*)    Hemoglobin 5.6 (*)    HCT 17.9 (*)    RDW 16.3 (*)    Lymphs Abs 0.4 (*)    Eosinophils Absolute 0.7 (*)    All other components within normal limits  BASIC METABOLIC PANEL - Abnormal; Notable for the following components:   CO2 16 (*)    BUN 94 (*)    Creatinine, Ser 13.11 (*)    GFR, Estimated 4  (*)    All other components within normal limits  TYPE AND SCREEN  PREPARE RBC (CROSSMATCH)    EKG None  Radiology No results found.  Procedures .Critical Care  Performed by: Pricilla Loveless, MD Authorized by: Pricilla Loveless, MD   Critical care provider statement:    Critical care time (minutes):  35   Critical care time was exclusive of:  Separately billable procedures and treating other patients   Critical care was necessary to treat or prevent imminent or life-threatening deterioration of the following conditions:  Renal failure, circulatory failure and cardiac failure   Critical care was time spent personally by me on the following activities:  Development of treatment plan with patient or surrogate, discussions with consultants, evaluation of patient's response to treatment, examination of patient, ordering and review of laboratory studies, ordering and review of radiographic studies, ordering and performing treatments and interventions, pulse oximetry, re-evaluation of patient's condition and review of old charts     Medications Ordered in ED Medications  0.9 %  sodium chloride infusion (Manually program via Guardrails IV Fluids) ( Intravenous New Bag/Given 12/01/22 1402)    ED Course/ Medical Decision Making/ A&P Clinical Course as of 12/01/22 1506  Fri Dec 01, 2022  1505 Sent for transfusion.  Refusing HD. Plan for discharge after trasnfusion. [CC]    Clinical Course User Index [CC] Glyn Ade, MD                             Medical Decision Making Amount and/or Complexity of Data Reviewed Labs: ordered.    Details: Hgb 5.6. Cr 13.  Risk Prescription drug management.   Patient with symptomatic anemia. He does not want to be admitted. Discussed with nephrology, Dr. Marisue Humble. Has reviewed chart/labs. Recommends 1 unit of PRBCs. Recommends admission to start dialysis. However family does not want to do this. Primarily daughter is most worried about him  getting a catheter in his neck. I tried to discuss how it works but they want the transfusion and to be discharged. He understands we'd like to start dialysis and it's not just about urine output. He was advised he can return at any time.         Final Clinical Impression(s) / ED Diagnoses Final diagnoses:  Symptomatic anemia  ESRD with anemia (HCC)    Rx / DC Orders ED Discharge Orders     None         Pricilla Loveless, MD 12/01/22  1508  

## 2022-12-01 NOTE — Progress Notes (Signed)
Hospital doctor at [employer] of hemocue 5.5 and repeat 5.2 and per [facility] client transferred to ER via wheelchair with daughter

## 2022-12-02 LAB — TYPE AND SCREEN
ABO/RH(D): A POS
Unit division: 0

## 2022-12-02 LAB — BPAM RBC: Blood Product Expiration Date: 202408132359

## 2022-12-15 ENCOUNTER — Encounter (HOSPITAL_COMMUNITY)
Admission: RE | Admit: 2022-12-15 | Discharge: 2022-12-15 | Disposition: A | Discharge status: Home / Self Care | Payer: Medicare HMO | Source: Ambulatory Visit | Attending: Nephrology | Admitting: Nephrology

## 2022-12-15 VITALS — BP 156/81 | HR 72 | Temp 97.3°F | Resp 17

## 2022-12-15 DIAGNOSIS — N183 Chronic kidney disease, stage 3 unspecified: Secondary | ICD-10-CM | POA: Insufficient documentation

## 2022-12-15 LAB — POCT HEMOGLOBIN-HEMACUE: Hemoglobin: 5.8 g/dL — CL (ref 13.0–17.0)

## 2022-12-15 MED ORDER — EPOETIN ALFA-EPBX 40000 UNIT/ML IJ SOLN: 40000.0000 [IU] | INTRAMUSCULAR | Status: DC

## 2022-12-15 MED ORDER — EPOETIN ALFA-EPBX 10000 UNIT/ML IJ SOLN
20000.0000 [IU] | Freq: Once | INTRAMUSCULAR | Status: AC
  Administered 2022-12-15: 20000 [IU] via SUBCUTANEOUS

## 2022-12-15 MED ORDER — EPOETIN ALFA-EPBX 10000 UNIT/ML IJ SOLN
INTRAMUSCULAR | Status: AC
  Filled 2022-12-15: qty 2

## 2022-12-15 MED ORDER — EPOETIN ALFA-EPBX 40000 UNIT/ML IJ SOLN
INTRAMUSCULAR | Status: AC
  Filled 2022-12-15: qty 1

## 2022-12-15 NOTE — Progress Notes (Signed)
Pt here for retacrit injection.  HGB 5.9 via hemocue, repeated and was 5.8.  Pt states that he feels good, no bleeding, chest pain or shortness of breath.  Dr Beaulah Corin office notified.  No new orders at this point based on previous conversations with the patient and family.  Daughter also does not want the patient to receive the ordered dose of retacrit.  Office notified of this and new orders received.  Pt to see MD in office later this month

## 2022-12-29 ENCOUNTER — Inpatient Hospital Stay (HOSPITAL_COMMUNITY): Payer: Medicare HMO

## 2022-12-29 ENCOUNTER — Encounter (HOSPITAL_COMMUNITY): Payer: Self-pay

## 2022-12-29 ENCOUNTER — Encounter (HOSPITAL_COMMUNITY)
Admission: RE | Admit: 2022-12-29 | Discharge: 2022-12-29 | Disposition: A | Discharge status: Home / Self Care | Payer: Medicare HMO | Source: Ambulatory Visit | Attending: Nephrology | Admitting: Nephrology

## 2022-12-29 ENCOUNTER — Emergency Department (HOSPITAL_COMMUNITY): Payer: Medicare HMO

## 2022-12-29 ENCOUNTER — Emergency Department (HOSPITAL_COMMUNITY)
Admission: EM | Admit: 2022-12-29 | Discharge: 2022-12-29 | DRG: 292 | Disposition: A | Discharge status: Home / Self Care | Payer: Medicare HMO | Source: Ambulatory Visit | Attending: Internal Medicine | Admitting: Internal Medicine

## 2022-12-29 ENCOUNTER — Other Ambulatory Visit: Payer: Self-pay

## 2022-12-29 VITALS — BP 150/78 | HR 71 | Temp 97.5°F | Resp 17

## 2022-12-29 DIAGNOSIS — D649 Anemia, unspecified: Secondary | ICD-10-CM | POA: Insufficient documentation

## 2022-12-29 DIAGNOSIS — N183 Chronic kidney disease, stage 3 unspecified: Secondary | ICD-10-CM

## 2022-12-29 DIAGNOSIS — D631 Anemia in chronic kidney disease: Secondary | ICD-10-CM | POA: Insufficient documentation

## 2022-12-29 DIAGNOSIS — J811 Chronic pulmonary edema: Secondary | ICD-10-CM | POA: Diagnosis not present

## 2022-12-29 DIAGNOSIS — I509 Heart failure, unspecified: Secondary | ICD-10-CM | POA: Insufficient documentation

## 2022-12-29 DIAGNOSIS — R0602 Shortness of breath: Secondary | ICD-10-CM | POA: Diagnosis not present

## 2022-12-29 DIAGNOSIS — E8770 Fluid overload, unspecified: Secondary | ICD-10-CM | POA: Diagnosis not present

## 2022-12-29 DIAGNOSIS — R600 Localized edema: Secondary | ICD-10-CM

## 2022-12-29 DIAGNOSIS — D638 Anemia in other chronic diseases classified elsewhere: Secondary | ICD-10-CM

## 2022-12-29 DIAGNOSIS — N185 Chronic kidney disease, stage 5: Secondary | ICD-10-CM | POA: Insufficient documentation

## 2022-12-29 DIAGNOSIS — I10 Essential (primary) hypertension: Secondary | ICD-10-CM | POA: Diagnosis present

## 2022-12-29 DIAGNOSIS — I1311 Hypertensive heart and chronic kidney disease without heart failure, with stage 5 chronic kidney disease, or end stage renal disease: Secondary | ICD-10-CM | POA: Insufficient documentation

## 2022-12-29 DIAGNOSIS — I132 Hypertensive heart and chronic kidney disease with heart failure and with stage 5 chronic kidney disease, or end stage renal disease: Secondary | ICD-10-CM | POA: Diagnosis not present

## 2022-12-29 DIAGNOSIS — R799 Abnormal finding of blood chemistry, unspecified: Secondary | ICD-10-CM | POA: Diagnosis not present

## 2022-12-29 DIAGNOSIS — N189 Chronic kidney disease, unspecified: Secondary | ICD-10-CM | POA: Insufficient documentation

## 2022-12-29 LAB — IRON AND TIBC
Iron: 34 ug/dL — ABNORMAL LOW (ref 45–182)
Saturation Ratios: 10 % — ABNORMAL LOW (ref 17.9–39.5)
TIBC: 353 ug/dL (ref 250–450)
UIBC: 319 ug/dL

## 2022-12-29 LAB — COMPREHENSIVE METABOLIC PANEL
ALT: 19 U/L (ref 0–44)
AST: 15 U/L (ref 15–41)
Albumin: 3.1 g/dL — ABNORMAL LOW (ref 3.5–5.0)
Alkaline Phosphatase: 44 U/L (ref 38–126)
Anion gap: 15 (ref 5–15)
BUN: 93 mg/dL — ABNORMAL HIGH (ref 8–23)
CO2: 15 mmol/L — ABNORMAL LOW (ref 22–32)
Calcium: 8.9 mg/dL (ref 8.9–10.3)
Chloride: 106 mmol/L (ref 98–111)
Creatinine, Ser: 13.46 mg/dL — ABNORMAL HIGH (ref 0.61–1.24)
GFR, Estimated: 4 mL/min — ABNORMAL LOW (ref >60)
Glucose, Bld: 97 mg/dL (ref 70–99)
Potassium: 4.6 mmol/L (ref 3.5–5.1)
Sodium: 136 mmol/L (ref 135–145)
Total Bilirubin: 0.6 mg/dL (ref 0.3–1.2)
Total Protein: 6 g/dL — ABNORMAL LOW (ref 6.5–8.1)

## 2022-12-29 LAB — URINALYSIS, ROUTINE W REFLEX MICROSCOPIC
Bilirubin Urine: NEGATIVE
Glucose, UA: NEGATIVE mg/dL
Ketones, ur: NEGATIVE mg/dL
Leukocytes,Ua: NEGATIVE
Nitrite: NEGATIVE
Protein, ur: 100 mg/dL — AB
Specific Gravity, Urine: 1.01 (ref 1.005–1.030)
pH: 6 (ref 5.0–8.0)

## 2022-12-29 LAB — CBC
HCT: 18.8 % — ABNORMAL LOW (ref 39.0–52.0)
Hemoglobin: 5.7 g/dL — CL (ref 13.0–17.0)
MCH: 27.8 pg (ref 26.0–34.0)
MCHC: 30.3 g/dL (ref 30.0–36.0)
MCV: 91.7 fL (ref 80.0–100.0)
Platelets: 149 10*3/uL — ABNORMAL LOW (ref 150–400)
RBC: 2.05 MIL/uL — ABNORMAL LOW (ref 4.22–5.81)
RDW: 15.9 % — ABNORMAL HIGH (ref 11.5–15.5)
WBC: 3.5 10*3/uL — ABNORMAL LOW (ref 4.0–10.5)
nRBC: 0 % (ref 0.0–0.2)

## 2022-12-29 LAB — PREPARE RBC (CROSSMATCH)

## 2022-12-29 LAB — POCT HEMOGLOBIN-HEMACUE: Hemoglobin: 5.4 g/dL — CL (ref 13.0–17.0)

## 2022-12-29 LAB — FERRITIN: Ferritin: 53 ng/mL (ref 24–336)

## 2022-12-29 MED ORDER — EPOETIN ALFA-EPBX 10000 UNIT/ML IJ SOLN
INTRAMUSCULAR | Status: AC
  Administered 2022-12-29: 20000 [IU] via SUBCUTANEOUS
  Filled 2022-12-29: qty 2

## 2022-12-29 MED ORDER — SODIUM CHLORIDE 0.9% IV SOLUTION: Freq: Once | INTRAVENOUS | Status: AC

## 2022-12-29 MED ORDER — EPOETIN ALFA-EPBX 10000 UNIT/ML IJ SOLN: 20000.0000 [IU] | INTRAMUSCULAR | Status: DC

## 2022-12-29 NOTE — ED Provider Notes (Signed)
[name] Provider Note   CSN: [id number] Arrival date & time: 12/29/22  1117     History  Chief Complaint  Patient presents with   Low Hgb    Larry Clements is a 67 y.o. male.  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  Patient denies any symptoms of lightheadedness, dizziness, shortness of breath, chest pain, fatigue, or generalized weakness.  Denies any black or bloody stools.  Denies hematemesis.  Patient seen on 12/01/2022 and received blood transfusion anemia of 5.6.  The history is provided by the patient. No language interpreter was used.       Home Medications Prior to Admission medications   Medication Sig Start Date End Date Taking? Authorizing Provider  acetaminophen (TYLENOL) 500 MG tablet Take 1,000 mg by mouth every 6 (six) hours as needed for mild pain.    [provider]  amLODipine (NORVASC) 10 MG tablet TAKE 1 TABLET BY MOUTH EVERY DAY Patient taking differently: Take 10 mg by mouth daily. 01/30/19   [name], MD  calcitRIOL (ROCALTROL) 0.5 MCG capsule Take 0.5 mcg by mouth daily. 03/17/19   [provider]  doxazosin (CARDURA) 4 MG tablet Take 6 mg by mouth at bedtime. 01/04/22   [provider]  furosemide (LASIX) 40 MG tablet TAKE 1 TABLET (40 MG TOTAL) BY MOUTH DAILY. 09/03/16   [name], MD  hydrALAZINE (APRESOLINE) 100 MG tablet Take 100 mg by mouth 3 (three) times daily.    [provider]  KLOR-CON M20 20 MEQ tablet TAKE 2 TABLETS BY MOUTH EVERY DAY Patient taking differently: 40 mEq daily. 03/19/18   [name], MD  labetalol (NORMODYNE) 300 MG tablet Take 300 mg by mouth every 8 (eight) hours. 08/28/16   [provider]  methocarbamol (ROBAXIN) 500 MG tablet Take 1 tablet (500 mg total)  by mouth 2 (two) times daily as needed for muscle spasms. 01/07/22   [name], MD  oxyCODONE-acetaminophen (PERCOCET/ROXICET) 5-325 MG tablet Take 1 tablet by mouth every 6 (six) hours as needed for severe pain. 11/19/20   [name], [name], PA-C  telmisartan (MICARDIS) 80 MG tablet Take 80 mg by mouth daily. 12/25/21   [provider]      Allergies    No known allergies    Review of Systems   Review of Systems  Constitutional:  Negative for chills and fever.  HENT:  Negative for ear pain and sore throat.   Eyes:  Negative for pain and visual disturbance.  Respiratory:  Negative for cough and shortness of breath.   Cardiovascular:  Negative for chest pain and palpitations.  Gastrointestinal:  Negative for abdominal pain and vomiting.  Genitourinary:  Negative for dysuria and hematuria.  Musculoskeletal:  Negative for arthralgias and back pain.  Skin:  Negative for color change and rash.  Neurological:  Negative for seizures and syncope.  All other systems reviewed and are negative.   Physical Exam Updated Vital Signs BP (!) 151/75   Pulse 63   Temp 97.6 F (36.4 C) (Oral)   Resp 19   SpO2 96%  Physical Exam Vitals and nursing note reviewed.  Constitutional:      General: He is not in acute distress.    Appearance: He is well-developed.  HENT:     Head:  Normocephalic and atraumatic.  Eyes:     Conjunctiva/sclera: Conjunctivae normal.     Comments: Subconjunctival pallor  Cardiovascular:     Rate and Rhythm: Normal rate and regular rhythm.     Heart sounds: No murmur heard. Pulmonary:     Effort: Pulmonary effort is normal. No respiratory distress.     Breath sounds: Normal breath sounds.  Abdominal:     Palpations: Abdomen is soft.     Tenderness: There is no abdominal tenderness.  Musculoskeletal:        General: No swelling.     Cervical back: Neck supple.     Right lower leg: 4+ Pitting Edema present.     Left lower leg: 4+ Pitting Edema present.   Skin:    General: Skin is warm and dry.     Capillary Refill: Capillary refill takes less than 2 seconds.  Neurological:     Mental Status: He is alert.  Psychiatric:        Mood and Affect: Mood normal.     ED Results / Procedures / Treatments   Labs (all labs ordered are listed, but only abnormal results are displayed) Labs Reviewed  COMPREHENSIVE METABOLIC PANEL - Abnormal; Notable for the following components:      Result Value   CO2 15 (*)    BUN 93 (*)    Creatinine, Ser 13.46 (*)    Total Protein 6.0 (*)    Albumin 3.1 (*)    GFR, Estimated 4 (*)    All other components within normal limits  CBC - Abnormal; Notable for the following components:   WBC 3.5 (*)    RBC 2.05 (*)    Hemoglobin 5.7 (*)    HCT 18.8 (*)    RDW 15.9 (*)    Platelets 149 (*)    All other components within normal limits  URINALYSIS, ROUTINE W REFLEX MICROSCOPIC - Abnormal; Notable for the following components:   Color, Urine STRAW (*)    Hgb urine dipstick SMALL (*)    Protein, ur 100 (*)    Bacteria, UA RARE (*)    All other components within normal limits  TYPE AND SCREEN  PREPARE RBC (CROSSMATCH)    EKG None  Radiology DG Chest Portable 1 View  Result Date: 12/29/2022 CLINICAL DATA:  fluid overload, leg swelling, sob EXAM: PORTABLE CHEST 1 VIEW COMPARISON:  01/07/2022 chest radiograph. FINDINGS: Stable cardiomediastinal silhouette with mild cardiomegaly. No pneumothorax. No pleural effusion. Mild pulmonary edema. IMPRESSION: Mild congestive heart failure. Electronically Signed   By: Delbert Phenix M.D.   On: 12/29/2022 13:52    Procedures .Critical Care  Performed by: Franne Forts, DO Authorized by: Franne Forts, DO   Critical care provider statement:    Critical care time (minutes):  101   Critical care was necessary to treat or prevent imminent or life-threatening deterioration of the following conditions:  Renal failure   Critical care was time spent personally by me on  the following activities:  Development of treatment plan with patient or surrogate, discussions with consultants, evaluation of patient's response to treatment, examination of patient, ordering and review of laboratory studies, ordering and review of radiographic studies, ordering and performing treatments and interventions, pulse oximetry, re-evaluation of patient's condition and review of old charts   Care discussed with: admitting provider     Care discussed with comment:  Cardiology and nephrology     Medications Ordered in ED Medications  0.9 %  sodium chloride  infusion (Manually program via Guardrails IV Fluids) (has no administration in time range)    ED Course/ Medical Decision Making/ A&P                                 Medical Decision Making Amount and/or Complexity of Data Reviewed Labs: ordered. Radiology: ordered.  Risk Prescription drug management. Decision regarding hospitalization.   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  On exam patient is alert and oriented x 3, no acute distress, afebrile, stable vital signs.  Abdomen is soft and nontender.  Patient has +4 pitting edema stopping just before the knees.  No rales or adventitious lung sounds.  Chart review demonstrates patient was seen on 12/01/2022 and received blood transfusion anemia of 5.6.  Hemoglobin confirmed at 5.5 today.  1 unit of packed red blood cells ordered.  Symptoms of pitting edema likely secondary to chronic renal disease however will order chest x-ray by family request to rule out other signs of congestive heart failure.  Patient has not received an echocardiogram to dates.  3:28 PM Chest x-ray concerning for pulmonary edema and cardiomegaly.  With that in the setting of pitting edema +4 up to the knees cardiology involved.  We both agree  that patient is significant candidate for dialysis at this time.  Nephrology consulted and will contact IR for dialysis cath placement.  It is currently a Friday 16,024 at 3:29 PM.  Will follow-up for specifics on OR time.  Will remain consulted at this time.  Hospital medicine accepting.        Final Clinical Impression(s) / ED Diagnoses Final diagnoses:  Congestive heart failure, unspecified HF chronicity, unspecified heart failure type (HCC)  Bilateral leg edema  Anemia of chronic disease  CKD (chronic kidney disease) stage 5, GFR less than 15 ml/min [facility])    Rx / DC Orders ED Discharge Orders     None         Franne Forts, DO 12/29/22 1547

## 2022-12-29 NOTE — Progress Notes (Signed)
Hemocue 5.4 today, Called [facility] and spoke with [facility].  Pt denies seeing any bleeding and said he feels good.  Pt was agreeable to going to the ER for blood.  Gave pt his retacrit injection and pt's daughter took him to the ER.

## 2022-12-29 NOTE — ED Triage Notes (Signed)
Pt came in via POV d/t having labs drawn today & was told his hgb was 5.4, denies any lightheaded/dizziness or weakness. A/Ox4, no pain.

## 2022-12-29 NOTE — H&P (Signed)
History and Physical    Larry Clements WNU:[id number] DOB: 11-21-1955 DOA: 12/29/2022  Referring MD/NP/PA: EDP PCP:  Patient coming from: home   Chief Complaint: anemia  HPI: Larry Clements is a 67/M with CKD5, not on dialysis yet, HTN, chronic anemia, requiring multiple transfusions in the last month, Hb of 5-6 range for past 1 month. He denies melena, hematochezia or hematemesis. Presents to ED w/ Hb of 5.4 requiring Blood transfusion, also noted to be significantly volume overloaded w/ creat of 13. Pt denies symptoms, appears to minimize them. Some orthopnea, gets up in the middle of the night from bed and goes and sits in recliner to watch TV, but denies dyspnea playing a role.  Review of Systems: As per HPI otherwise 14 point review of systems negative.   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 PLA2R staining +. Mod to severe arterionephrosclerosis, mod TI scarring, secondary focal and segmental tuft sclerosis    Past Surgical History:  Procedure Laterality Date   COLONOSCOPY     EUS N/A 09/21/2016   Procedure: UPPER ENDOSCOPIC ULTRASOUND (EUS) LINEAR;  Surgeon: Rachael Fee, MD;  Location: [facility] ENDOSCOPY;  Service: Endoscopy;  Laterality: N/A;   LAPAROSCOPIC GASTRECTOMY N/A 10/26/2016   Procedure: LAPAROSCOPIC PARTIAL GASTRECTOMY;  Surgeon: Almond Lint, MD;  Location: [facility] OR;  Service: General;  Laterality: N/A;   LAPAROSCOPIC PARTIAL GASTRECTOMY  10/26/2016   SURGICAL EXCISION OF EXCESSIVE SKIN     fatty tissue     reports that he has never smoked. He has never used smokeless tobacco. He reports that he does not drink alcohol and does not use drugs.  Allergies  Allergen Reactions   No Known Allergies     Family History  Problem Relation Age of Onset   Diabetes Father     Kidney failure Father    Colon cancer Father    Hypertension Mother      Prior to Admission medications   Medication Sig Start Date End Date Taking? Authorizing Provider  acetaminophen (TYLENOL) 500 MG tablet Take 1,000 mg by mouth every 6 (six) hours as needed for mild pain.    [provider]  amLODipine (NORVASC) 10 MG tablet TAKE 1 TABLET BY MOUTH EVERY DAY Patient taking differently: Take 10 mg by mouth daily. 01/30/19   [name], MD  calcitRIOL (ROCALTROL) 0.5 MCG capsule Take 0.5 mcg by mouth daily. 03/17/19   [provider]  doxazosin (CARDURA) 4 MG tablet Take 6 mg by mouth at bedtime. 01/04/22   [provider]  furosemide (LASIX) 40 MG tablet TAKE 1 TABLET (40 MG TOTAL) BY MOUTH DAILY. 09/03/16   [name], MD  hydrALAZINE (APRESOLINE) 100 MG tablet Take 100 mg by mouth 3 (three) times daily.    [provider]  KLOR-CON M20 20 MEQ tablet TAKE 2 TABLETS BY MOUTH EVERY DAY Patient taking differently: 40 mEq daily. 03/19/18   [name], MD  labetalol (NORMODYNE) 300 MG tablet Take 300 mg by mouth every 8 (eight) hours. 08/28/16   [provider]  methocarbamol (ROBAXIN) 500 MG tablet Take 1 tablet (500 mg total) by mouth 2 (two) times daily as needed for muscle spasms. 01/07/22   [name], MD  oxyCODONE-acetaminophen (PERCOCET/ROXICET) 5-325 MG tablet Take 1 tablet by mouth  every 6 (six) hours as needed for severe pain. 11/19/20   [name], [name], PA-C  telmisartan (MICARDIS) 80 MG tablet Take 80 mg by mouth daily. 12/25/21   [provider]    Physical Exam: Vitals:   12/29/22 1122 12/29/22 1345 12/29/22 1526  BP: 135/77 (!) 151/75   Pulse: 67 63   Resp: 16 19   Temp: 97.9 F (36.6 C)  97.6 F (36.4 C)  TempSrc: Oral  Oral  SpO2: 95% 96%       Constitutional: NAD, calm, comfortable HEENT:+ JVD Respiratory: + rales Cardiovascular: S1S2/RRR Abdomen: soft, non tender, Edema, Bowel sounds positive.   Musculoskeletal: No joint deformity upper and lower extremities. Ext: 2 plus edema Skin: no rashes Psychiatric: Normal judgment and insight. Alert and oriented x 3. Normal mood.   Labs on Admission: I have personally reviewed following labs and imaging studies  CBC: Recent Labs  Lab 12/29/22 1108 12/29/22 1127  WBC  --  3.5*  HGB 5.4* 5.7*  HCT  --  18.8*  MCV  --  91.7  PLT  --  149*   Basic Metabolic Panel: Recent Labs  Lab 12/29/22 1127  NA 136  K 4.6  CL 106  CO2 15*  GLUCOSE 97  BUN 93*  CREATININE 13.46*  CALCIUM 8.9   GFR: CrCl cannot be calculated (Unknown ideal weight.). Liver Function Tests: Recent Labs  Lab 12/29/22 1127  AST 15  ALT 19  ALKPHOS 44  BILITOT 0.6  PROT 6.0*  ALBUMIN 3.1*   No results for input(s): "LIPASE", "AMYLASE" in the last 168 hours. No results for input(s): "AMMONIA" in the last 168 hours. Coagulation Profile: No results for input(s): "INR", "PROTIME" in the last 168 hours. Cardiac Enzymes: No results for input(s): "CKTOTAL", "CKMB", "CKMBINDEX", "TROPONINI" in the last 168 hours. BNP (last 3 results) No results for input(s): "PROBNP" in the last 8760 hours. HbA1C: No results for input(s): "HGBA1C" in the last 72 hours. CBG: No results for input(s): "GLUCAP" in the last 168 hours. Lipid Profile: No results for input(s): "CHOL", "HDL", "LDLCALC", "TRIG", "CHOLHDL", "LDLDIRECT" in the last 72 hours. Thyroid Function Tests: No results for input(s): "TSH", "T4TOTAL", "FREET4", "T3FREE", "THYROIDAB" in the last 72 hours. Anemia Panel: Recent Labs    12/29/22 1059  FERRITIN 53  TIBC 353  IRON 34*   Urine analysis:    Component Value Date/Time   COLORURINE STRAW (A) 12/29/2022 1402   APPEARANCEUR CLEAR 12/29/2022 1402   LABSPEC 1.010 12/29/2022 1402   PHURINE 6.0 12/29/2022 1402   GLUCOSEU NEGATIVE 12/29/2022 1402   HGBUR SMALL (A) 12/29/2022 1402   BILIRUBINUR NEGATIVE 12/29/2022 1402   BILIRUBINUR NEG  06/18/2017 1122   KETONESUR NEGATIVE 12/29/2022 1402   PROTEINUR 100 (A) 12/29/2022 1402   UROBILINOGEN 0.2 06/18/2017 1122   NITRITE NEGATIVE 12/29/2022 1402   LEUKOCYTESUR NEGATIVE 12/29/2022 1402   Sepsis Labs: @LABRCNTIP (procalcitonin:4,lacticidven:4) )No results found for this or any previous visit (from the past 240 hour(s)).   Radiological Exams on Admission: DG Chest Portable 1 View  Result Date: 12/29/2022 CLINICAL DATA:  fluid overload, leg swelling, sob EXAM: PORTABLE CHEST 1 VIEW COMPARISON:  01/07/2022 chest radiograph. FINDINGS: Stable cardiomediastinal silhouette with mild cardiomegaly. No pneumothorax. No pleural effusion. Mild pulmonary edema. IMPRESSION: Mild congestive heart failure. Electronically Signed   By: Delbert Phenix M.D.   On: 12/29/2022 13:52    EKG: Independently reviewed.   Assessment/Plan  Progressive CKD 5, Volume overload Metabolic acidosis -Nephrology consulting, plan for HD  cath to start HD this admission -ADDENDUM: Pt and daughter are adamant to leave after transfusion and will not stay for HD cath and to start Hemodialysis now, explained risks of Volume overload resp failure, arrythmias etc. Nephrology had ordered HD catheter this evening, now cancelled for today since they are leaving  CHF (congestive heart failure) (HCC) -likely diastolic -in setting of CKD5 -will update ECHO when possible   Chronic anemia -likely from CKD5 snd hemodilution -recent anemia panel w/ Iron defi, transfusing PRBC -getting EPo per Renal Q2weeks   Hypertension  -on labetalol, hydralazine and Amlodipine   DVT prophylaxis: hep SQ Code Status: full code Family Communication:  Disposition Plan: home next week Consults called: renal Admission status: inpatient  Zannie Cove MD [facility]   12/29/2022, 3:52 PM

## 2022-12-29 NOTE — ED Notes (Signed)
Pt is a difficult stick, 2 failed attempts from me, another nurse has also attempted twice, Wallace Cullens, DO notified.

## 2022-12-29 NOTE — Progress Notes (Signed)
IR Procedure Request - HD catheter placement  67 y.o. male ( in the ED). History of HTN, GIST, CKD. Presented to the ED at [facility] on 8.16.24 with abnormal lab values. Found to have a Hgb of 5.7 and in renal failure. Team is requesting a tunneled HD catheter for hemodialysis access. Okay per IR Attending Dr. Mosie Epstein. Patient refusing to be transferred to IR for procedure. States that he will be leaving AMA after blood transfusion administration. Message sent to requesting provider via EPIC Chat.  Hgb 5.7, Hct 18.8, PLT 149, BUN 93, Cr 13.46, GFR < 4, albumin 3.5  Should the patient be amendable to HD catheter placement or any other IR procedures are warranted please re-consult IR.

## 2022-12-29 NOTE — ED Notes (Signed)
Hgb 5.7, Wallace Cullens, DO notified.

## 2022-12-29 NOTE — Progress Notes (Signed)
  [facility] Progress Note   67 y/o man with HTN, anemia requiring Retacrit requiring multiple transfusionsi n the past month, CKD5 w/  h/o membranous with +PLA2R followed by Dr. Signe Colt who is presenting with an abnormal lab with a Hb of 5.4. He denies SOB but daughter bedside states he does get short of breath with exertion. He denies CP, fever, chills, nausea, vomiting and still has a great appetite. He has leg swelling which is substantial with CXR showing mild congestion.  Hb was 5.4 and he is receiving 2 UPRBC. BUN/Cr was 93/13.46 with a K of 4.6 and HCO3 of 15.  Assessment/ Plan:   1) CKD5 with progressive renal decline and advised to initiate dialysis with clear volume overload and frequency of transfusions but he has a funeral to go to and is signing out AMA. He already has an appt with Dr. Signe Colt on 8/22 and likely will need to proceed with referral for placement of access. He is right arm dominant and I answered all questions from the patient and his daughter. He prefers to place access and wait three months but I'm not convinced he will be able to stay off dialysis for that long of a time. They expressed understanding and still wanted to sign out AMA.   Appreciate VIR agreeing to place a catheter only to have plans change, especially late on a Friday.  2) Anemia being transfused and is also iron deficient; should receive some iron from the blood but in future may need IV infusion as well.  3) HFpEF likely with diastolic dysfunction and precipitated with advanced renal failure. Instructed pt and daughter about weighing himself each morning. If weights are increasing then will have to incresase Lasix dosage. Currently 40-80mg  BID.  4) HTN  Subjective:   Dyspnea with exertion but denies CP/ orthopnea/ anorexia. Food still tastes good and he has a great appetite.    Objective:   BP (!) 155/84 (BP Location: Right Arm)   Pulse 65   Temp 97.8 F (36.6 C) (Oral)   Resp 15    SpO2 97%  No intake or output data in the 24 hours ending 12/29/22 1620 Weight change:   Physical Exam: GEN: NAD, A&Ox3, NCAT HEENT: Conjunctival pallor, EOMI NECK: Supple, no thyromegaly LUNGS: CTA B/L rales only at bases, rhonchi or wheezing CV: RRR, No M/R/G, +JVP ABD: SNDNT +BS, Obese EXT: 2-3+ lower extremity edema      Imaging: DG Chest Portable 1 View  Result Date: 12/29/2022 CLINICAL DATA:  fluid overload, leg swelling, sob EXAM: PORTABLE CHEST 1 VIEW COMPARISON:  01/07/2022 chest radiograph. FINDINGS: Stable cardiomediastinal silhouette with mild cardiomegaly. No pneumothorax. No pleural effusion. Mild pulmonary edema. IMPRESSION: Mild congestive heart failure. Electronically Signed   By: Delbert Phenix M.D.   On: 12/29/2022 13:52    Labs: BMET Recent Labs  Lab 12/29/22 1127  NA 136  K 4.6  CL 106  CO2 15*  GLUCOSE 97  BUN 93*  CREATININE 13.46*  CALCIUM 8.9   CBC Recent Labs  Lab 12/29/22 1108 12/29/22 1127  WBC  --  3.5*  HGB 5.4* 5.7*  HCT  --  18.8*  MCV  --  91.7  PLT  --  149*    Medications:     sodium chloride   Intravenous Once      Larry Floor, MD 12/29/2022, 4:20 PM

## 2022-12-30 LAB — TYPE AND SCREEN
ABO/RH(D): A POS
Antibody Screen: NEGATIVE
Unit division: 0

## 2022-12-30 LAB — BPAM RBC
Blood Product Expiration Date: 202408282359
ISSUE DATE / TIME: 202408161537
Unit Type and Rh: 6200

## 2023-01-02 ENCOUNTER — Other Ambulatory Visit: Payer: Self-pay | Admitting: *Deleted

## 2023-01-02 DIAGNOSIS — N186 End stage renal disease: Secondary | ICD-10-CM

## 2023-01-04 ENCOUNTER — Telehealth: Payer: Self-pay | Admitting: Internal Medicine

## 2023-01-04 ENCOUNTER — Encounter: Payer: Self-pay | Admitting: Internal Medicine

## 2023-01-04 DIAGNOSIS — I120 Hypertensive chronic kidney disease with stage 5 chronic kidney disease or end stage renal disease: Secondary | ICD-10-CM | POA: Diagnosis not present

## 2023-01-04 DIAGNOSIS — N2581 Secondary hyperparathyroidism of renal origin: Secondary | ICD-10-CM | POA: Diagnosis not present

## 2023-01-04 DIAGNOSIS — D631 Anemia in chronic kidney disease: Secondary | ICD-10-CM | POA: Diagnosis not present

## 2023-01-04 DIAGNOSIS — R809 Proteinuria, unspecified: Secondary | ICD-10-CM | POA: Diagnosis not present

## 2023-01-04 DIAGNOSIS — N185 Chronic kidney disease, stage 5: Secondary | ICD-10-CM | POA: Diagnosis not present

## 2023-01-04 DIAGNOSIS — N189 Chronic kidney disease, unspecified: Secondary | ICD-10-CM | POA: Diagnosis not present

## 2023-01-04 NOTE — Telephone Encounter (Addendum)
Larry Clements [phone]  Toni Amend called to see if you would order [facility] an oxygen tank, she states that he needs one. She said that the Kidney doctor says he needs one but want order one because they say he will get better when he starts dialysis in 2 weeks. I let her know that you normally do not order oxygen tanks that comes thru pulmonary, she says she is well aware she wants you to do it, and wants to know if he can come and see you right now. Because told her you defiantly could not order one without seeing him.   I have spoken with Nephrologist at [facility]. His O2 sat was 85% today in their office. I have spoken with daughter Toni Amend. I will go ahead order home oxygen for him but it will not correct the kidney issue and he may need to return to the hospital. They are working on arranging out patient dialysis for him. MJB, MD

## 2023-01-05 ENCOUNTER — Ambulatory Visit (HOSPITAL_COMMUNITY)
Admission: RE | Admit: 2023-01-05 | Discharge: 2023-01-05 | Disposition: A | Discharge status: Home / Self Care | Payer: Medicare HMO | Source: Ambulatory Visit | Attending: Vascular Surgery | Admitting: Vascular Surgery

## 2023-01-05 ENCOUNTER — Ambulatory Visit (INDEPENDENT_AMBULATORY_CARE_PROVIDER_SITE_OTHER)
Admission: RE | Admit: 2023-01-05 | Discharge: 2023-01-05 | Disposition: A | Discharge status: Home / Self Care | Payer: Medicare HMO | Source: Ambulatory Visit | Attending: Vascular Surgery | Admitting: Vascular Surgery

## 2023-01-05 DIAGNOSIS — N186 End stage renal disease: Secondary | ICD-10-CM | POA: Diagnosis not present

## 2023-01-11 DIAGNOSIS — Z992 Dependence on renal dialysis: Secondary | ICD-10-CM | POA: Diagnosis not present

## 2023-01-11 DIAGNOSIS — N186 End stage renal disease: Secondary | ICD-10-CM | POA: Diagnosis not present

## 2023-01-12 ENCOUNTER — Encounter (HOSPITAL_COMMUNITY)
Admission: RE | Admit: 2023-01-12 | Discharge: 2023-01-12 | Disposition: A | Discharge status: Home / Self Care | Payer: Medicare HMO | Source: Ambulatory Visit | Attending: Nephrology | Admitting: Nephrology

## 2023-01-12 VITALS — BP 158/82 | HR 70 | Temp 97.4°F | Resp 18

## 2023-01-12 DIAGNOSIS — N183 Chronic kidney disease, stage 3 unspecified: Secondary | ICD-10-CM | POA: Diagnosis not present

## 2023-01-12 LAB — POCT HEMOGLOBIN-HEMACUE: Hemoglobin: 7.4 g/dL — ABNORMAL LOW (ref 13.0–17.0)

## 2023-01-12 MED ORDER — EPOETIN ALFA-EPBX 10000 UNIT/ML IJ SOLN
20000.0000 [IU] | INTRAMUSCULAR | Status: DC
  Administered 2023-01-12: 20000 [IU] via SUBCUTANEOUS

## 2023-01-12 MED ORDER — EPOETIN ALFA-EPBX 10000 UNIT/ML IJ SOLN
INTRAMUSCULAR | Status: AC
  Filled 2023-01-12: qty 2

## 2023-01-26 ENCOUNTER — Encounter (HOSPITAL_COMMUNITY)
Admission: RE | Admit: 2023-01-26 | Discharge: 2023-01-26 | Disposition: A | Discharge status: Home / Self Care | Payer: Medicare HMO | Source: Ambulatory Visit | Attending: Nephrology | Admitting: Nephrology

## 2023-01-26 VITALS — BP 181/92 | HR 87 | Temp 97.3°F | Resp 18

## 2023-01-26 DIAGNOSIS — N183 Chronic kidney disease, stage 3 unspecified: Secondary | ICD-10-CM | POA: Insufficient documentation

## 2023-01-26 LAB — IRON AND TIBC
Iron: 88 ug/dL (ref 45–182)
Saturation Ratios: 24 % (ref 17.9–39.5)
TIBC: 365 ug/dL (ref 250–450)
UIBC: 277 ug/dL

## 2023-01-26 LAB — FERRITIN: Ferritin: 128 ng/mL (ref 24–336)

## 2023-01-26 MED ORDER — EPOETIN ALFA-EPBX 10000 UNIT/ML IJ SOLN: 20000.0000 [IU] | INTRAMUSCULAR | Status: DC

## 2023-01-26 NOTE — Progress Notes (Signed)
Called Felicia at [facility] about HGB 7.0, patient denies any blood or symptoms. Per Sunny Schlein patient should not get any injection - he is starting dialysis today and will get there.

## 2023-01-29 LAB — POCT HEMOGLOBIN-HEMACUE: Hemoglobin: 7 g/dL — ABNORMAL LOW (ref 13.0–17.0)

## 2023-02-06 ENCOUNTER — Encounter: Payer: Medicare HMO | Admitting: Vascular Surgery

## 2023-02-06 NOTE — Progress Notes (Deleted)
Patient name: Larry Clements MRN: [id number] DOB: [birth year] Sex: male  REASON FOR CONSULT: Access placement  HPI: Larry Clements is a 67 y.o. male, with history of chronic kidney disease and hypertension that presents for permanent access evaluation.  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 PLA2R staining +. Mod to severe arterionephrosclerosis, mod TI scarring, secondary focal and segmental tuft sclerosis    Past Surgical History:  Procedure Laterality Date   COLONOSCOPY     EUS N/A 09/21/2016   Procedure: UPPER ENDOSCOPIC ULTRASOUND (EUS) LINEAR;  Surgeon: Rachael Fee, MD;  Location: [facility];  Service: Endoscopy;  Laterality: N/A;   LAPAROSCOPIC GASTRECTOMY N/A 10/26/2016   Procedure: LAPAROSCOPIC PARTIAL GASTRECTOMY;  Surgeon: Almond Lint, MD;  Location: [facility];  Service: General;  Laterality: N/A;   LAPAROSCOPIC PARTIAL GASTRECTOMY  10/26/2016   SURGICAL EXCISION OF EXCESSIVE SKIN     fatty tissue    Family History  Problem Relation Age of Onset   Diabetes Father    Kidney failure Father    Colon cancer Father    Hypertension Mother     SOCIAL HISTORY: Social History   Socioeconomic History   Marital status: Single    Spouse name: Not on file   Number of children: Not on file   Years of education: Not on file   Highest education level: Not on file  Occupational History   Not on file  Tobacco Use   Smoking status: Never   Smokeless tobacco: Never  Vaping Use   Vaping status: Never Used  Substance and Sexual Activity   Alcohol use: No    Alcohol/week: 0.0 standard drinks of alcohol   Drug use: No   Sexual activity: Not on file  Other Topics Concern   Not on file  Social History Narrative   Not on file   Social Determinants of Health   Financial  Resource Strain: Not on file  Food Insecurity: No Food Insecurity (02/15/2022)   Hunger Vital Sign    Worried About Running Out of Food in the Last Year: Never true    Ran Out of Food in the Last Year: Never true  Transportation Needs: No Transportation Needs (02/15/2022)   PRAPARE - Administrator, Civil Service (Medical): No    Lack of Transportation (Non-Medical): No  Physical Activity: Not on file  Stress: Not on file  Social Connections: Not on file  Intimate Partner Violence: Not At Risk (02/15/2022)   Humiliation, Afraid, Rape, and Kick questionnaire    Fear of Current or Ex-Partner: No    Emotionally Abused: No    Physically Abused: No    Sexually Abused: No    No Known Allergies  Current Outpatient Medications  Medication Sig Dispense Refill   amLODipine (NORVASC) 10 MG tablet TAKE 1 TABLET BY MOUTH EVERY DAY (Patient taking differently: Take 10 mg by mouth daily.) 90 tablet 3   calcitRIOL (ROCALTROL) 0.5 MCG capsule Take 0.5 mcg by mouth daily.     doxazosin (CARDURA) 4 MG tablet Take 6 mg by mouth at bedtime.     furosemide (LASIX) 40 MG tablet TAKE 1 TABLET (40 MG TOTAL) BY MOUTH DAILY. (Patient taking differently: Take 40-80 mg by mouth See admin instructions. 80 mg every morning, 40 mg in  the afternoon.) 90 tablet 0   hydrALAZINE (APRESOLINE) 100 MG tablet Take 100 mg by mouth 3 (three) times daily.     labetalol (NORMODYNE) 300 MG tablet Take 300 mg by mouth every 8 (eight) hours.  5   telmisartan (MICARDIS) 80 MG tablet Take 80 mg by mouth daily.     No current facility-administered medications for this visit.    REVIEW OF SYSTEMS:  [X]  denotes positive finding, [ ]  denotes negative finding Cardiac  Comments:  Chest pain or chest pressure: ***   Shortness of breath upon exertion:    Short of breath when lying flat:    Irregular heart rhythm:        Vascular    Pain in calf, thigh, or hip brought on by ambulation:    Pain in feet at night that wakes  you up from your sleep:     Blood clot in your veins:    Leg swelling:         Pulmonary    Oxygen at home:    Productive cough:     Wheezing:         Neurologic    Sudden weakness in arms or legs:     Sudden numbness in arms or legs:     Sudden onset of difficulty speaking or slurred speech:    Temporary loss of vision in one eye:     Problems with dizziness:         Gastrointestinal    Blood in stool:     Vomited blood:         Genitourinary    Burning when urinating:     Blood in urine:        Psychiatric    Major depression:         Hematologic    Bleeding problems:    Problems with blood clotting too easily:        Skin    Rashes or ulcers:        Constitutional    Fever or chills:      PHYSICAL EXAM: There were no vitals filed for this visit.  GENERAL: The patient is a well-nourished male, in no acute distress. The vital signs are documented above. CARDIAC: There is a regular rate and rhythm.  VASCULAR: *** PULMONARY: There is good air exchange bilaterally without wheezing or rales. ABDOMEN: Soft and non-tender with normal pitched bowel sounds.  MUSCULOSKELETAL: There are no major deformities or cyanosis. NEUROLOGIC: No focal weakness or paresthesias are detected. SKIN: There are no ulcers or rashes noted. PSYCHIATRIC: The patient has a normal affect.  DATA:   ***  Assessment/Plan:  ***   Cephus Shelling, MD [facility] Office: [phone]

## 2023-02-08 ENCOUNTER — Encounter: Payer: Self-pay | Admitting: Vascular Surgery

## 2023-02-08 ENCOUNTER — Ambulatory Visit: Payer: Medicare HMO | Admitting: Vascular Surgery

## 2023-02-08 VITALS — BP 183/92 | HR 84 | Temp 98.2°F | Resp 20 | Ht 76.0 in | Wt 222.7 lb

## 2023-02-08 DIAGNOSIS — N186 End stage renal disease: Secondary | ICD-10-CM

## 2023-02-08 NOTE — Progress Notes (Signed)
Patient ID: Larry Clements, male   DOB: 08/25/1955, 67 y.o.   MRN: [id number]  Reason for Consult: New Patient (Initial Visit)   Referred by [name], MD  Subjective:     HPI:  Larry Clements is a 67 y.o. male currently on dialysis via right IJ tunneled dialysis catheter on Mondays Tuesdays, Thursdays and Fridays.  He does not take any blood thinners.  He is right-hand dominant he has never had any left upper extremity or chest or breast surgery or pacemakers or defibrillators.  He is a former IT consultant.  He does not take blood thinners.  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 PLA2R staining +. Mod to severe arterionephrosclerosis, mod TI scarring, secondary focal and segmental tuft sclerosis   Family History  Problem Relation Age of Onset   Diabetes Father    Kidney failure Father    Colon cancer Father    Hypertension Mother    Past Surgical History:  Procedure Laterality Date   COLONOSCOPY     EUS N/A 09/21/2016   Procedure: UPPER ENDOSCOPIC ULTRASOUND (EUS) LINEAR;  Surgeon: Rachael Fee, MD;  Location: [facility];  Service: Endoscopy;  Laterality: N/A;   LAPAROSCOPIC GASTRECTOMY N/A 10/26/2016   Procedure: LAPAROSCOPIC PARTIAL GASTRECTOMY;  Surgeon: Almond Lint, MD;  Location: [facility];  Service: General;  Laterality: N/A;   LAPAROSCOPIC PARTIAL GASTRECTOMY  10/26/2016   SURGICAL EXCISION OF EXCESSIVE SKIN     fatty tissue    Short Social History:  Social History   Tobacco Use   Smoking status: Never   Smokeless tobacco: Never  Substance Use Topics   Alcohol use: No    Alcohol/week: 0.0 standard drinks of alcohol    No Known Allergies  Current Outpatient Medications  Medication Sig Dispense Refill   amLODipine (NORVASC) 10 MG tablet TAKE 1 TABLET BY  MOUTH EVERY DAY (Patient taking differently: Take 10 mg by mouth daily.) 90 tablet 3   calcitRIOL (ROCALTROL) 0.5 MCG capsule Take 0.5 mcg by mouth daily.     doxazosin (CARDURA) 4 MG tablet Take 6 mg by mouth at bedtime.     furosemide (LASIX) 40 MG tablet TAKE 1 TABLET (40 MG TOTAL) BY MOUTH DAILY. (Patient taking differently: Take 40-80 mg by mouth See admin instructions. 80 mg every morning, 40 mg in the afternoon.) 90 tablet 0   hydrALAZINE (APRESOLINE) 100 MG tablet Take 100 mg by mouth 3 (three) times daily.     labetalol (NORMODYNE) 300 MG tablet Take 300 mg by mouth every 8 (eight) hours.  5   sevelamer carbonate (RENVELA) 800 MG tablet Take by mouth.     spironolactone (ALDACTONE) 25 MG tablet Take 25 mg by mouth daily.     telmisartan (MICARDIS) 80 MG tablet Take 80 mg by mouth daily.     No current facility-administered medications for this visit.    Review of Systems  Constitutional: Positive for fatigue.  HENT: HENT negative.  Eyes: Eyes negative.  Respiratory: Respiratory negative.  Cardiovascular: Cardiovascular negative.  GI: Gastrointestinal negative.  Musculoskeletal: Musculoskeletal negative.  Skin: Skin negative.  Neurological: Neurological negative. Hematologic: Hematologic/lymphatic negative.  Psychiatric: Psychiatric negative.        Objective:  Objective   Vitals:   02/08/23 1001  BP: (!) 183/92  Pulse:  84  Resp: 20  Temp: 98.2 F (36.8 C)  SpO2: 98%  Weight: 222 lb 11.2 oz (101 kg)  Height: 6\' 4"  (1.93 m)   Body mass index is 27.11 kg/m.  Physical Exam HENT:     Head: Normocephalic.     Nose: Nose normal.  Eyes:     Pupils: Pupils are equal, round, and reactive to light.  Cardiovascular:     Pulses: Normal pulses.  Pulmonary:     Effort: Pulmonary effort is normal.  Abdominal:     General: Abdomen is flat.  Musculoskeletal:     Cervical back: Normal range of motion.     Right lower leg: No edema.     Left lower leg: No edema.   Skin:    General: Skin is warm.     Capillary Refill: Capillary refill takes less than 2 seconds.  Neurological:     General: No focal deficit present.     Mental Status: He is alert.  Psychiatric:        Mood and Affect: Mood normal.        Thought Content: Thought content normal.        Judgment: Judgment normal.     Data: Right Pre-Dialysis Findings:  +-----------------------+----------+--------------------+---------+--------  +  Location              PSV (cm/s)Intralum. Diam. (cm)Waveform  Comments  +-----------------------+----------+--------------------+---------+--------  +  Brachial Antecub. [id number]        0.67                triphasic           +-----------------------+----------+--------------------+---------+--------  +  Radial Art at Wrist    108       0.30                triphasic           +-----------------------+----------+--------------------+---------+--------  +  Ulnar Art at Wrist     79        0.20                triphasic           +-----------------------+----------+--------------------+---------+--------  +   Left Pre-Dialysis Findings:  +-----------------------+----------+--------------------+---------+--------  +  Location              PSV (cm/s)Intralum. Diam. (cm)Waveform  Comments  +-----------------------+----------+--------------------+---------+--------  +  Brachial Antecub. [id number]        0.72                triphasic           +-----------------------+----------+--------------------+---------+--------  +  Radial Art at Wrist    99        0.29                triphasic           +-----------------------+----------+--------------------+---------+--------  +  Ulnar Art at Wrist     62        0.21                triphasic           +-----------------------+----------+--------------------+---------+--------  +    Summary:    Right: No obstruction visualized in the right upper  extremity.  Left: No obstruction visualized in the left upper extremity.   Right Cephalic   Diameter (cm)Depth (cm)   Findings     +-----------------+-------------+----------+--------------+  Shoulder  not visualized  +-----------------+-------------+----------+--------------+  Prox upper arm                          not visualized  +-----------------+-------------+----------+--------------+  Mid upper arm                           not visualized  +-----------------+-------------+----------+--------------+  Dist upper arm                          not visualized  +-----------------+-------------+----------+--------------+  Antecubital fossa    0.20                               +-----------------+-------------+----------+--------------+  Prox forearm         0.24                               +-----------------+-------------+----------+--------------+  Mid forearm          0.23                  thrombus     +-----------------+-------------+----------+--------------+  Dist forearm         0.20                               +-----------------+-------------+----------+--------------+   +-----------------+-------------+----------+--------+  Right Basilic    Diameter (cm)Depth (cm)Findings  +-----------------+-------------+----------+--------+  Mid upper arm        0.35                         +-----------------+-------------+----------+--------+  Dist upper arm       0.39                         +-----------------+-------------+----------+--------+  Antecubital fossa    0.25                         +-----------------+-------------+----------+--------+   +-----------------+-------------+----------+--------------+  Left Cephalic    Diameter (cm)Depth (cm)   Findings     +-----------------+-------------+----------+--------------+  Shoulder                               not visualized   +-----------------+-------------+----------+--------------+  Prox upper arm                          not visualized  +-----------------+-------------+----------+--------------+  Mid upper arm                           not visualized  +-----------------+-------------+----------+--------------+  Dist upper arm                          not visualized  +-----------------+-------------+----------+--------------+  Antecubital fossa    0.22                               +-----------------+-------------+----------+--------------+  Prox forearm         0.20                               +-----------------+-------------+----------+--------------+  Mid forearm          0.33                  thrombus     +-----------------+-------------+----------+--------------+  Dist forearm         0.22                               +-----------------+-------------+----------+--------------+   +-----------------+-------------+----------+--------+  Left Basilic     Diameter (cm)Depth (cm)Findings  +-----------------+-------------+----------+--------+  Prox upper arm       0.41                         +-----------------+-------------+----------+--------+  Mid upper arm        0.31                         +-----------------+-------------+----------+--------+  Dist upper arm       0.29                         +-----------------+-------------+----------+--------+  Antecubital fossa    0.29                         +-----------------+-------------+----------+--------+   Summary: Right: Thrombus observed in the mid forearm segment of the  cephalic        vein.         Patent and compressible basilic vein.  Left: Thrombus observed in the mid forearm segment of the cephalic        vein.        Patent and compressible basilic vein.        Assessment/Plan:     67 year old male with end-stage renal disease on dialysis via right IJ tunneled catheter.   Patient does not appear to have suitable vein other than possibly his left basilic above the antecubitum.  I discussed proceeding with left arm fistula versus graft on a nondialysis day in the near future and he and his daughter demonstrate good understanding.  We discussed the risk benefits alternatives.  We may have to reschedule dialysis given that he currently dialyzes 4 days a week.     Maeola Harman MD [facility]

## 2023-02-09 ENCOUNTER — Encounter (HOSPITAL_COMMUNITY): Payer: Medicare HMO

## 2023-02-12 ENCOUNTER — Telehealth: Payer: Self-pay

## 2023-02-12 DIAGNOSIS — Z992 Dependence on renal dialysis: Secondary | ICD-10-CM | POA: Diagnosis not present

## 2023-02-12 DIAGNOSIS — I129 Hypertensive chronic kidney disease with stage 1 through stage 4 chronic kidney disease, or unspecified chronic kidney disease: Secondary | ICD-10-CM | POA: Diagnosis not present

## 2023-02-12 DIAGNOSIS — N186 End stage renal disease: Secondary | ICD-10-CM | POA: Diagnosis not present

## 2023-02-12 NOTE — Telephone Encounter (Signed)
Spoke with pt and daughter regarding scheduling his LUE fistula vs graft surgery. She will call me back after discussing with pt which Friday in October they are wanting to schedule surgery on.

## 2023-02-13 ENCOUNTER — Telehealth: Payer: Self-pay

## 2023-02-13 NOTE — Telephone Encounter (Signed)
I spoke to both Larry Clements and his daughter yesterday about scheduling and they were going to discuss dates and get back to [place]. I have tried them both today and did not reach either one. I was able to leave Larry Clements a VM to call us back, however, daugther's VM is full.

## 2023-02-20 NOTE — Telephone Encounter (Signed)
Left VM for patient to return call. This is the 3rd attempt made. Will mail patient an UTR letter.

## 2023-02-27 ENCOUNTER — Other Ambulatory Visit: Payer: Self-pay

## 2023-02-27 DIAGNOSIS — N186 End stage renal disease: Secondary | ICD-10-CM

## 2023-02-27 NOTE — Telephone Encounter (Signed)
Spoke with patient and daughter, Larry Clements who requested surgery to be scheduled in November. They agreed with scheduling on 11/7. Instructions provided and voiced understanding. Will also mail instructions letter per request.

## 2023-03-15 DIAGNOSIS — Z992 Dependence on renal dialysis: Secondary | ICD-10-CM | POA: Diagnosis not present

## 2023-03-15 DIAGNOSIS — N186 End stage renal disease: Secondary | ICD-10-CM | POA: Diagnosis not present

## 2023-03-15 DIAGNOSIS — I129 Hypertensive chronic kidney disease with stage 1 through stage 4 chronic kidney disease, or unspecified chronic kidney disease: Secondary | ICD-10-CM | POA: Diagnosis not present

## 2023-03-20 NOTE — Progress Notes (Signed)
PCP - [name], MD  Cardiologist -   PPM/ICD -  Device Orders -  Rep Notified -   Chest x-ray - 12-29-22 EKG -  Stress Test -  ECHO -  Cardiac Cath -   DM denies  Blood Thinner Instructions:  Aspirin Instructions:   ERAS Protcol - npo  COVID TEST- no  Anesthesia review: yes, CKD 5, HTN, and low hemoglobin  Patient verbally denies any shortness of breath, fever, cough and chest pain during phone call   -------------  SDW INSTRUCTIONS given:  Your procedure is scheduled on March 22, 2023.  Report to [facility] Main Entrance "A" at 5:30 A.M., and check in at the Admitting office.  Call this number if you have problems the morning of surgery:  [phone]   Remember:  Do not eat or drink after midnight the night before your surgery      Take these medicines the morning of surgery with A SIP OF WATER  amLODipine (NORVASC)  hydrALAZINE (APRESOLINE)  labetalol (NORMODYNE)    As of today, STOP taking any Aspirin (unless otherwise instructed by your surgeon) Aleve, Naproxen, Ibuprofen, Motrin, Advil, Goody's, BC's, all herbal medications, fish oil, and all vitamins.                      Do not wear jewelry, make up, or nail polish            Do not wear lotions, powders, perfumes/colognes, or deodorant.            Do not shave 48 hours prior to surgery.  Men may shave face and neck.            Do not bring valuables to the hospital.            [facility] is not responsible for any belongings or valuables.  Do NOT Smoke (Tobacco/Vaping) 24 hours prior to your procedure If you use a CPAP at night, you may bring all equipment for your overnight stay.   Contacts, glasses, dentures or bridgework may not be worn into surgery.      For patients admitted to the hospital, discharge time will be determined by your treatment team.   Patients discharged the day of surgery will not be allowed to drive home, and someone needs to stay with them for 24  hours.    Special instructions:   [place]- Preparing For Surgery  Before surgery, you can play an important role. Because skin is not sterile, your skin needs to be as free of germs as possible. You can reduce the number of germs on your skin by washing with CHG (chlorahexidine gluconate) Soap before surgery.  CHG is an antiseptic cleaner which kills germs and bonds with the skin to continue killing germs even after washing.    Oral Hygiene is also important to reduce your risk of infection.  Remember - BRUSH YOUR TEETH THE MORNING OF SURGERY WITH YOUR REGULAR TOOTHPASTE  Please do not use if you have an allergy to CHG or antibacterial soaps. If your skin becomes reddened/irritated stop using the CHG.  Do not shave (including legs and underarms) for at least 48 hours prior to first CHG shower. It is OK to shave your face.  Please follow these instructions carefully.   Shower the NIGHT BEFORE SURGERY and the MORNING OF SURGERY with DIAL Soap.   Pat yourself dry with a CLEAN TOWEL.  Wear CLEAN PAJAMAS to bed the night  before surgery  Place CLEAN SHEETS on your bed the night of your first shower and DO NOT SLEEP WITH PETS.   Day of Surgery: Please shower morning of surgery  Wear Clean/Comfortable clothing the morning of surgery Do not apply any deodorants/lotions.   Remember to brush your teeth WITH YOUR REGULAR TOOTHPASTE.   Questions were answered. Patient verbalized understanding of instructions.

## 2023-03-21 NOTE — Anesthesia Preprocedure Evaluation (Signed)
Anesthesia Evaluation  Patient identified by MRN, date of birth, ID band Patient awake    Reviewed: Allergy & Precautions, NPO status , Patient's Chart, lab work & pertinent test results  History of Anesthesia Complications (+) PONV and history of anesthetic complications  Airway Mallampati: II  TM Distance: >3 FB Neck ROM: Full    Dental  (+) Teeth Intact, Dental Advisory Given   Pulmonary neg pulmonary ROS   Pulmonary exam normal breath sounds clear to auscultation       Cardiovascular hypertension, Pt. on medications and Pt. on home beta blockers +CHF  Normal cardiovascular exam Rhythm:Regular Rate:Normal     Neuro/Psych negative neurological ROS  negative psych ROS   GI/Hepatic Neg liver ROS,neg GERD  ,,Gastrointestinal stromal tumor    Endo/Other  negative endocrine ROS    Renal/GU ESRF and DialysisRenal disease (TTHSAT)     Musculoskeletal negative musculoskeletal ROS (+)    Abdominal   Peds  Hematology  (+) Blood dyscrasia, anemia   Anesthesia Other Findings   Reproductive/Obstetrics                             Anesthesia Physical Anesthesia Plan  ASA: 4  Anesthesia Plan: General   Post-op Pain Management: Tylenol PO (pre-op)*   Induction: Intravenous  PONV Risk Score and Plan: 2 and Dexamethasone, Ondansetron and Midazolam  Airway Management Planned: LMA  Additional Equipment:   Intra-op Plan:   Post-operative Plan: Extubation in OR  Informed Consent: I have reviewed the patients History and Physical, chart, labs and discussed the procedure including the risks, benefits and alternatives for the proposed anesthesia with the patient or authorized representative who has indicated his/her understanding and acceptance.     Dental advisory given  Plan Discussed with: CRNA  Anesthesia Plan Comments: (Per Dr Darcella Cheshire note, fistula target is above AC on left arm.)         Anesthesia Quick Evaluation

## 2023-03-22 ENCOUNTER — Other Ambulatory Visit: Payer: Self-pay

## 2023-03-22 ENCOUNTER — Ambulatory Visit (HOSPITAL_BASED_OUTPATIENT_CLINIC_OR_DEPARTMENT_OTHER): Payer: Self-pay | Admitting: Physician Assistant

## 2023-03-22 ENCOUNTER — Encounter (HOSPITAL_COMMUNITY): Payer: Self-pay | Admitting: Vascular Surgery

## 2023-03-22 ENCOUNTER — Ambulatory Visit (HOSPITAL_COMMUNITY)
Admission: RE | Admit: 2023-03-22 | Discharge: 2023-03-22 | Disposition: A | Discharge status: Home / Self Care | Payer: Medicare HMO | Attending: Vascular Surgery | Admitting: Vascular Surgery

## 2023-03-22 ENCOUNTER — Encounter (HOSPITAL_COMMUNITY)
Admission: RE | Disposition: A | Discharge status: Home / Self Care | Payer: Self-pay | Source: Home / Self Care | Attending: Vascular Surgery

## 2023-03-22 ENCOUNTER — Ambulatory Visit (HOSPITAL_COMMUNITY): Payer: Medicare HMO | Admitting: Physician Assistant

## 2023-03-22 DIAGNOSIS — Z79899 Other long term (current) drug therapy: Secondary | ICD-10-CM | POA: Diagnosis not present

## 2023-03-22 DIAGNOSIS — Z8249 Family history of ischemic heart disease and other diseases of the circulatory system: Secondary | ICD-10-CM | POA: Insufficient documentation

## 2023-03-22 DIAGNOSIS — N186 End stage renal disease: Secondary | ICD-10-CM

## 2023-03-22 DIAGNOSIS — E1122 Type 2 diabetes mellitus with diabetic chronic kidney disease: Secondary | ICD-10-CM | POA: Diagnosis not present

## 2023-03-22 DIAGNOSIS — Z992 Dependence on renal dialysis: Secondary | ICD-10-CM | POA: Insufficient documentation

## 2023-03-22 DIAGNOSIS — I509 Heart failure, unspecified: Secondary | ICD-10-CM

## 2023-03-22 DIAGNOSIS — Z95828 Presence of other vascular implants and grafts: Secondary | ICD-10-CM | POA: Diagnosis not present

## 2023-03-22 DIAGNOSIS — I132 Hypertensive heart and chronic kidney disease with heart failure and with stage 5 chronic kidney disease, or end stage renal disease: Secondary | ICD-10-CM

## 2023-03-22 DIAGNOSIS — Z9889 Other specified postprocedural states: Secondary | ICD-10-CM

## 2023-03-22 HISTORY — DX: Other specified postprocedural states: Z98.890

## 2023-03-22 HISTORY — DX: Anemia, unspecified: D64.9

## 2023-03-22 HISTORY — PX: AV FISTULA PLACEMENT: SHX1204

## 2023-03-22 HISTORY — DX: Other specified postprocedural states: R11.2

## 2023-03-22 LAB — POCT I-STAT, CHEM 8
BUN: 34 mg/dL — ABNORMAL HIGH (ref 8–23)
Calcium, Ion: 1.18 mmol/L (ref 1.15–1.40)
Chloride: 95 mmol/L — ABNORMAL LOW (ref 98–111)
Creatinine, Ser: 7.2 mg/dL — ABNORMAL HIGH (ref 0.61–1.24)
Glucose, Bld: 97 mg/dL (ref 70–99)
HCT: 26 % — ABNORMAL LOW (ref 39.0–52.0)
Hemoglobin: 8.8 g/dL — ABNORMAL LOW (ref 13.0–17.0)
Potassium: 4.2 mmol/L (ref 3.5–5.1)
Sodium: 137 mmol/L (ref 135–145)
TCO2: 31 mmol/L (ref 22–32)

## 2023-03-22 SURGERY — ARTERIOVENOUS (AV) FISTULA CREATION
Anesthesia: General | Laterality: Left

## 2023-03-22 MED ORDER — CHLORHEXIDINE GLUCONATE 4 % EX SOLN: 60.0000 mL | Freq: Once | CUTANEOUS | Status: DC

## 2023-03-22 MED ORDER — ONDANSETRON HCL 4 MG/2ML IJ SOLN
INTRAMUSCULAR | Status: DC | PRN
  Administered 2023-03-22: 4 mg via INTRAVENOUS

## 2023-03-22 MED ORDER — DEXMEDETOMIDINE HCL IN NACL 80 MCG/20ML IV SOLN
INTRAVENOUS | Status: DC | PRN
  Administered 2023-03-22: 4 ug via INTRAVENOUS
  Administered 2023-03-22 (×3): 8 ug via INTRAVENOUS

## 2023-03-22 MED ORDER — LIDOCAINE-EPINEPHRINE (PF) 1 %-1:200000 IJ SOLN
INTRAMUSCULAR | Status: AC
  Filled 2023-03-22: qty 30

## 2023-03-22 MED ORDER — ONDANSETRON HCL 4 MG/2ML IJ SOLN
INTRAMUSCULAR | Status: AC
  Filled 2023-03-22: qty 2

## 2023-03-22 MED ORDER — PROPOFOL 10 MG/ML IV BOLUS
INTRAVENOUS | Status: AC
  Filled 2023-03-22: qty 20

## 2023-03-22 MED ORDER — CEFAZOLIN SODIUM-DEXTROSE 2-4 GM/100ML-% IV SOLN
2.0000 g | INTRAVENOUS | Status: AC
Start: 2023-03-22 — End: 2023-03-22
  Administered 2023-03-22: 2 g via INTRAVENOUS
  Filled 2023-03-22: qty 100

## 2023-03-22 MED ORDER — FENTANYL CITRATE (PF) 250 MCG/5ML IJ SOLN
INTRAMUSCULAR | Status: AC
  Filled 2023-03-22: qty 5

## 2023-03-22 MED ORDER — PROPOFOL 10 MG/ML IV BOLUS
INTRAVENOUS | Status: DC | PRN
  Administered 2023-03-22 (×2): 50 mg via INTRAVENOUS
  Administered 2023-03-22: 100 mg via INTRAVENOUS

## 2023-03-22 MED ORDER — HEPARIN SODIUM (PORCINE) 1000 UNIT/ML IJ SOLN
INTRAMUSCULAR | Status: AC
  Filled 2023-03-22: qty 10

## 2023-03-22 MED ORDER — ORAL CARE MOUTH RINSE: 15.0000 mL | Freq: Once | OROMUCOSAL | Status: AC

## 2023-03-22 MED ORDER — ACETAMINOPHEN 500 MG PO TABS
1000.0000 mg | ORAL_TABLET | Freq: Once | ORAL | Status: AC
  Administered 2023-03-22: 1000 mg via ORAL
  Filled 2023-03-22: qty 2

## 2023-03-22 MED ORDER — HYDROMORPHONE HCL 1 MG/ML IJ SOLN
INTRAMUSCULAR | Status: DC | PRN
  Administered 2023-03-22: .5 mg via INTRAVENOUS

## 2023-03-22 MED ORDER — CHLORHEXIDINE GLUCONATE 0.12 % MT SOLN
15.0000 mL | Freq: Once | OROMUCOSAL | Status: AC
  Administered 2023-03-22: 15 mL via OROMUCOSAL
  Filled 2023-03-22: qty 15

## 2023-03-22 MED ORDER — LIDOCAINE 2% (20 MG/ML) 5 ML SYRINGE
INTRAMUSCULAR | Status: DC | PRN
  Administered 2023-03-22: 100 mg via INTRAVENOUS

## 2023-03-22 MED ORDER — HYDROMORPHONE HCL 1 MG/ML IJ SOLN
INTRAMUSCULAR | Status: AC
  Filled 2023-03-22: qty 0.5

## 2023-03-22 MED ORDER — MIDAZOLAM HCL 2 MG/2ML IJ SOLN
INTRAMUSCULAR | Status: DC | PRN
  Administered 2023-03-22: 1 mg via INTRAVENOUS

## 2023-03-22 MED ORDER — HEPARIN 6000 UNIT IRRIGATION SOLUTION
Status: AC
  Filled 2023-03-22: qty 500

## 2023-03-22 MED ORDER — LIDOCAINE 2% (20 MG/ML) 5 ML SYRINGE
INTRAMUSCULAR | Status: AC
  Filled 2023-03-22: qty 5

## 2023-03-22 MED ORDER — MIDAZOLAM HCL 2 MG/2ML IJ SOLN
INTRAMUSCULAR | Status: AC
  Filled 2023-03-22: qty 2

## 2023-03-22 MED ORDER — HEPARIN 6000 UNIT IRRIGATION SOLUTION
Status: DC | PRN
  Administered 2023-03-22: 1

## 2023-03-22 MED ORDER — GLYCOPYRROLATE PF 0.2 MG/ML IJ SOSY
PREFILLED_SYRINGE | INTRAMUSCULAR | Status: AC
  Filled 2023-03-22: qty 1

## 2023-03-22 MED ORDER — LABETALOL HCL 300 MG PO TABS
300.0000 mg | ORAL_TABLET | Freq: Once | ORAL | Status: AC
  Administered 2023-03-22: 300 mg via ORAL
  Filled 2023-03-22: qty 1

## 2023-03-22 MED ORDER — 0.9 % SODIUM CHLORIDE (POUR BTL) OPTIME
TOPICAL | Status: DC | PRN
  Administered 2023-03-22: 1000 mL

## 2023-03-22 MED ORDER — SODIUM CHLORIDE 0.9 % IV SOLN: INTRAVENOUS | Status: DC

## 2023-03-22 MED ORDER — FENTANYL CITRATE (PF) 250 MCG/5ML IJ SOLN
INTRAMUSCULAR | Status: DC | PRN
  Administered 2023-03-22 (×10): 25 ug via INTRAVENOUS

## 2023-03-22 MED ORDER — GLYCOPYRROLATE PF 0.2 MG/ML IJ SOSY
PREFILLED_SYRINGE | INTRAMUSCULAR | Status: DC | PRN
  Administered 2023-03-22: .2 mg via INTRAVENOUS

## 2023-03-22 SURGICAL SUPPLY — 33 items
ADH SKN CLS APL DERMABOND .7 (GAUZE/BANDAGES/DRESSINGS) ×1
ARMBAND PINK RESTRICT EXTREMIT (MISCELLANEOUS) ×2 IMPLANT
BAG COUNTER SPONGE SURGICOUNT (BAG) ×2 IMPLANT
BAG SPNG CNTER NS LX DISP (BAG) ×1
CANISTER SUCT 3000ML PPV (MISCELLANEOUS) ×2 IMPLANT
CLIP LIGATING EXTRA MED SLVR (CLIP) ×2 IMPLANT
CLIP LIGATING EXTRA SM BLUE (MISCELLANEOUS) ×2 IMPLANT
CLIP TI MEDIUM 6 (CLIP) IMPLANT
COVER PROBE W GEL 5X96 (DRAPES) IMPLANT
DERMABOND ADVANCED .7 DNX12 (GAUZE/BANDAGES/DRESSINGS) ×2 IMPLANT
ELECT REM PT RETURN 9FT ADLT (ELECTROSURGICAL) ×1
ELECTRODE REM PT RTRN 9FT ADLT (ELECTROSURGICAL) ×2 IMPLANT
GLOVE BIO SURGEON STRL SZ7.5 (GLOVE) ×2 IMPLANT
GOWN STRL REUS W/ TWL LRG LVL3 (GOWN DISPOSABLE) ×4 IMPLANT
GOWN STRL REUS W/ TWL XL LVL3 (GOWN DISPOSABLE) ×2 IMPLANT
GOWN STRL REUS W/TWL LRG LVL3 (GOWN DISPOSABLE) ×2
GOWN STRL REUS W/TWL XL LVL3 (GOWN DISPOSABLE) ×1
INSERT FOGARTY SM (MISCELLANEOUS) IMPLANT
KIT BASIN OR (CUSTOM PROCEDURE TRAY) ×2 IMPLANT
KIT TURNOVER KIT B (KITS) ×2 IMPLANT
NS IRRIG 1000ML POUR BTL (IV SOLUTION) ×2 IMPLANT
PACK CV ACCESS (CUSTOM PROCEDURE TRAY) ×2 IMPLANT
PAD ARMBOARD 7.5X6 YLW CONV (MISCELLANEOUS) ×4 IMPLANT
POWDER SURGICEL 3.0 GRAM (HEMOSTASIS) IMPLANT
SLING ARM FOAM STRAP LRG (SOFTGOODS) IMPLANT
SLING ARM FOAM STRAP MED (SOFTGOODS) IMPLANT
SUT MNCRL AB 4-0 PS2 18 (SUTURE) ×2 IMPLANT
SUT PROLENE 6 0 BV (SUTURE) ×2 IMPLANT
SUT VIC AB 3-0 SH 27 (SUTURE) ×1
SUT VIC AB 3-0 SH 27X BRD (SUTURE) ×2 IMPLANT
TOWEL GREEN STERILE (TOWEL DISPOSABLE) ×2 IMPLANT
UNDERPAD 30X36 HEAVY ABSORB (UNDERPADS AND DIAPERS) ×2 IMPLANT
WATER STERILE IRR 1000ML POUR (IV SOLUTION) ×2 IMPLANT

## 2023-03-22 NOTE — Transfer of Care (Signed)
Immediate Anesthesia Transfer of Care Note  Patient: Larry Clements  Procedure(s) Performed: LEFT ARM ARTERIOVENOUS (AV) FISTULA CREATION (Left)  Patient Location: PACU  Anesthesia Type:General  Level of Consciousness: drowsy, patient cooperative, and responds to stimulation  Airway & Oxygen Therapy: Patient Spontanous Breathing and Patient connected to nasal cannula oxygen  Post-op Assessment: Report given to RN and Post -op Vital signs reviewed and stable  Post vital signs: Reviewed and stable  Last Vitals:  Vitals Value Taken Time  BP    Temp    Pulse    Resp    SpO2      Last Pain:  Vitals:   03/22/23 0634  TempSrc:   PainSc: 0-No pain         Complications: No notable events documented.

## 2023-03-22 NOTE — Op Note (Signed)
Patient name: Larry Clements MRN: [id number] DOB: 10-20-1955 Sex: male  03/22/2023 Pre-operative Diagnosis: End-stage renal disease Post-operative diagnosis:  Same Surgeon:  Apolinar Junes C. Randie Heinz, MD Assistant: Nathanial Rancher, PA Procedure Performed:  Left upper arm brachial artery to basilic vein AV fistula creation  Indications: 67 year old male on dialysis via right IJ tunneled catheter.  He is right-hand dominant without previous left upper arm procedures and is now indicated for left arm AV fistula versus graft.  Given the complexity of the case,  the assistant was necessary in order to expedite the procedure and safely perform the technical aspects of the operation.  The assistant provided traction and countertraction to assist with exposure of the artery and vein.  They also assisted with suture ligation of multiple venous branches.  They played a critical role in the anastomosis. These skills, especially following the Prolene suture for the anastomosis, could not have been adequately performed by a scrub tech assistant.    Findings: There is a very large basilic vein above the antecubital with a very diminutive cephalic vein.  The brachial artery was also large and free of disease measured approximately 5 mm in external diameter.  The basilic vein was connected to the brachial artery and the antecubital space and at completion there was a very strong thrill up the upper arm traced with Doppler and a palpable radial artery pulse at the wrist confirmed with Doppler as well.   Procedure:  The patient was identified in the holding area and taken to the operating room where he was placed upon operative table and LMA anesthesia was induced.  He was sterilely prepped and draped in the left upper extremity in the usual fashion, antibiotics were administered and a timeout was called.  Ultrasound was used to identify what initially appeared to be a suitable size cephalic vein at the wrist however this  was more diminutive towards the forearm patient was not agreeable to wrist-based fistula.  We looked in the upper arm and there was a very diminutive cephalic vein at the antecubital and that could really not be traced throughout the upper arm but a large basilic vein that could be traced with ultrasound of the upper arm.  A transverse incision was created between the identified basilic vein and the palpable brachial pulse at the antecubital.  We dissected down around the vein marked this for orientation.  We dissected through the deep fascia and encircled the brachial artery.  One of the brachial veins was initially damaged and was primarily paired with lateral venography with 6-0 Prolene suture.  Once this was complete we encircled the brachial artery with vessel loop.  We then transected the vein distally and tied this off spatulated flushed with heparinized saline and clamped it.  We then clamped the artery distally proximally opened longitudinally and given the large size the brachial artery flushed distally only.  The vein was then sewn into side with 6-0 Prolene suture.  Prior to completion we allowed flushing all directions.  Upon completion there was a very strong thrill in the basilic vein we did free up some of the soft tissue around the vein and allowed it to seat nicely.  There is a palpable radial artery pulse at the wrist all of this was confirmed with Doppler.  The wound was irrigated closed in layers of Vicryl and Monocryl.  Dermabond was placed at the skin level.  The patient was awakened from anesthesia having tolerated the procedure well without immediate complication.  All counts were correct at completion.  EBL: 20 cc     Eudelia Hiltunen C. Randie Heinz, MD [facility] [place] Office: [phone] Pager: [phone]

## 2023-03-22 NOTE — Anesthesia Procedure Notes (Signed)
Procedure Name: LMA Insertion Date/Time: 03/22/2023 7:38 AM  Performed by: Ayesha Rumpf, CRNAPre-anesthesia Checklist: Patient identified, Emergency Drugs available, Suction available and Patient being monitored Patient Re-evaluated:Patient Re-evaluated prior to induction Oxygen Delivery Method: Circle System Utilized Preoxygenation: Pre-oxygenation with 100% oxygen Induction Type: IV induction Ventilation: Mask ventilation without difficulty LMA: LMA inserted LMA Size: 5.0 Number of attempts: 1 Airway Equipment and Method: Bite block Placement Confirmation: positive ETCO2 Tube secured with: Tape Dental Injury: Teeth and Oropharynx as per pre-operative assessment

## 2023-03-22 NOTE — H&P (Signed)
HPI:   Larry Clements is a 66 y.o. male currently on dialysis via right IJ tunneled dialysis catheter on Mondays Tuesdays, Thursdays and Fridays.  He does not take any blood thinners.  He is right-hand dominant he has never had any left upper extremity or chest or breast surgery or pacemakers or defibrillators.  He is a former IT consultant.  He does not take blood thinners.       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 PLA2R staining +. Mod to severe arterionephrosclerosis, mod TI scarring, secondary focal and segmental tuft sclerosis             Family History  Problem Relation Age of Onset   Diabetes Father     Kidney failure Father     Colon cancer Father     Hypertension Mother               Past Surgical History:  Procedure Laterality Date   COLONOSCOPY       EUS N/A 09/21/2016    Procedure: UPPER ENDOSCOPIC ULTRASOUND (EUS) LINEAR;  Surgeon: Rachael Fee, MD;  Location: [facility];  Service: Endoscopy;  Laterality: N/A;   LAPAROSCOPIC GASTRECTOMY N/A 10/26/2016    Procedure: LAPAROSCOPIC PARTIAL GASTRECTOMY;  Surgeon: Almond Lint, MD;  Location: [facility] OR;  Service: General;  Laterality: N/A;   LAPAROSCOPIC PARTIAL GASTRECTOMY   10/26/2016   SURGICAL EXCISION OF EXCESSIVE SKIN        fatty tissue          Short Social History:  Social History         Tobacco Use   Smoking status: Never   Smokeless tobacco: Never  Substance Use Topics   Alcohol use: No      Alcohol/week: 0.0 standard drinks of alcohol      Allergies  No Known Allergies           Current Outpatient Medications  Medication Sig Dispense Refill   amLODipine (NORVASC) 10 MG tablet TAKE 1 TABLET BY MOUTH EVERY DAY (Patient taking differently: Take 10 mg by mouth daily.) 90 tablet 3   calcitRIOL  (ROCALTROL) 0.5 MCG capsule Take 0.5 mcg by mouth daily.       doxazosin (CARDURA) 4 MG tablet Take 6 mg by mouth at bedtime.       furosemide (LASIX) 40 MG tablet TAKE 1 TABLET (40 MG TOTAL) BY MOUTH DAILY. (Patient taking differently: Take 40-80 mg by mouth See admin instructions. 80 mg every morning, 40 mg in the afternoon.) 90 tablet 0   hydrALAZINE (APRESOLINE) 100 MG tablet Take 100 mg by mouth 3 (three) times daily.       labetalol (NORMODYNE) 300 MG tablet Take 300 mg by mouth every 8 (eight) hours.   5   sevelamer carbonate (RENVELA) 800 MG tablet Take by mouth.       spironolactone (ALDACTONE) 25 MG tablet Take 25 mg by mouth daily.       telmisartan (MICARDIS) 80 MG tablet Take 80 mg by mouth daily.          No current facility-administered medications for this visit.        Review of Systems  Constitutional: Positive for fatigue.  HENT: HENT negative.  Eyes: Eyes negative.  Respiratory:  Respiratory negative.  Cardiovascular: Cardiovascular negative.  GI: Gastrointestinal negative.  Musculoskeletal: Musculoskeletal negative.  Skin: Skin negative.  Neurological: Neurological negative. Hematologic: Hematologic/lymphatic negative.  Psychiatric: Psychiatric negative.          Objective:    Vitals:   03/22/23 0611  BP: (!) 186/86  Pulse: 64  Resp: 18  Temp: 98.5 F (36.9 C)  SpO2: 99%   Physical Exam HENT:     Head: Normocephalic.     Nose: Nose normal.  Eyes:     Pupils: Pupils are equal, round, and reactive to light.  Cardiovascular:     Pulses: Normal pulses.  Pulmonary:     Effort: Pulmonary effort is normal.  Abdominal:     General: Abdomen is flat.  Musculoskeletal:     Cervical back: Normal range of motion.     Right lower leg: No edema.     Left lower leg: No edema.  Skin:    General: Skin is warm.     Capillary Refill: Capillary refill takes less than 2 seconds.  Neurological:     General: No focal deficit present.     Mental Status: He is  alert.  Psychiatric:        Mood and Affect: Mood normal.        Thought Content: Thought content normal.        Judgment: Judgment normal.        Data: Right Pre-Dialysis Findings:  +-----------------------+----------+--------------------+---------+--------  +  Location              PSV (cm/s)Intralum. Diam. (cm)Waveform  Comments  +-----------------------+----------+--------------------+---------+--------  +  Brachial Antecub. fossa98        0.67                triphasic           +-----------------------+----------+--------------------+---------+--------  +  Radial Art at Wrist    108       0.30                triphasic           +-----------------------+----------+--------------------+---------+--------  +  Ulnar Art at Wrist     79        0.20                triphasic           +-----------------------+----------+--------------------+---------+--------  +   Left Pre-Dialysis Findings:  +-----------------------+----------+--------------------+---------+--------  +  Location              PSV (cm/s)Intralum. Diam. (cm)Waveform  Comments  +-----------------------+----------+--------------------+---------+--------  +  Brachial Antecub. fossa96        0.72                triphasic           +-----------------------+----------+--------------------+---------+--------  +  Radial Art at Wrist    99        0.29                triphasic           +-----------------------+----------+--------------------+---------+--------  +  Ulnar Art at Wrist     62        0.21                triphasic           +-----------------------+----------+--------------------+---------+--------  +    Summary:    Right: No obstruction visualized in the right upper extremity.  Left: No obstruction visualized  in the left upper extremity.    Right Cephalic   Diameter (cm)Depth (cm)   Findings      +-----------------+-------------+----------+--------------+  Shoulder                               not visualized  +-----------------+-------------+----------+--------------+  Prox upper arm                          not visualized  +-----------------+-------------+----------+--------------+  Mid upper arm                           not visualized  +-----------------+-------------+----------+--------------+  Dist upper arm                          not visualized  +-----------------+-------------+----------+--------------+  Antecubital fossa    0.20                               +-----------------+-------------+----------+--------------+  Prox forearm         0.24                               +-----------------+-------------+----------+--------------+  Mid forearm          0.23                  thrombus     +-----------------+-------------+----------+--------------+  Dist forearm         0.20                               +-----------------+-------------+----------+--------------+   +-----------------+-------------+----------+--------+  Right Basilic    Diameter (cm)Depth (cm)Findings  +-----------------+-------------+----------+--------+  Mid upper arm        0.35                         +-----------------+-------------+----------+--------+  Dist upper arm       0.39                         +-----------------+-------------+----------+--------+  Antecubital fossa    0.25                         +-----------------+-------------+----------+--------+   +-----------------+-------------+----------+--------------+  Left Cephalic    Diameter (cm)Depth (cm)   Findings     +-----------------+-------------+----------+--------------+  Shoulder                               not visualized  +-----------------+-------------+----------+--------------+  Prox upper arm                          not visualized   +-----------------+-------------+----------+--------------+  Mid upper arm                           not visualized  +-----------------+-------------+----------+--------------+  Dist upper arm                          not visualized  +-----------------+-------------+----------+--------------+  Antecubital fossa  0.22                               +-----------------+-------------+----------+--------------+  Prox forearm         0.20                               +-----------------+-------------+----------+--------------+  Mid forearm          0.33                  thrombus     +-----------------+-------------+----------+--------------+  Dist forearm         0.22                               +-----------------+-------------+----------+--------------+   +-----------------+-------------+----------+--------+  Left Basilic     Diameter (cm)Depth (cm)Findings  +-----------------+-------------+----------+--------+  Prox upper arm       0.41                         +-----------------+-------------+----------+--------+  Mid upper arm        0.31                         +-----------------+-------------+----------+--------+  Dist upper arm       0.29                         +-----------------+-------------+----------+--------+  Antecubital fossa    0.29                         +-----------------+-------------+----------+--------+   Summary: Right: Thrombus observed in the mid forearm segment of the  cephalic        vein.         Patent and compressible basilic vein.  Left: Thrombus observed in the mid forearm segment of the cephalic        vein.        Patent and compressible basilic vein.          Assessment/Plan:   67 year old male with end-stage renal disease on dialysis via right IJ tunneled catheter.  Patient does not appear to have suitable vein other than possibly his left basilic above the antecubitum.  I discussed  proceeding with left arm fistula versus graft on a nondialysis day in the near future and he and his daughter demonstrate good understanding.  We discussed the risk benefits alternatives.           Maeola Harman MD [facility]

## 2023-03-22 NOTE — Anesthesia Postprocedure Evaluation (Signed)
Anesthesia Post Note  Patient: Larry Clements  Procedure(s) Performed: LEFT ARM ARTERIOVENOUS (AV) FISTULA CREATION (Left)     Patient location during evaluation: PACU Anesthesia Type: General Level of consciousness: awake and alert Pain management: pain level controlled Vital Signs Assessment: post-procedure vital signs reviewed and stable Respiratory status: spontaneous breathing, nonlabored ventilation, respiratory function stable and patient connected to nasal cannula oxygen Cardiovascular status: blood pressure returned to baseline and stable Postop Assessment: no apparent nausea or vomiting Anesthetic complications: no   No notable events documented.  Last Vitals:  Vitals:   03/22/23 0915 03/22/23 0930  BP: 133/82 138/79  Pulse: 72 68  Resp: 10 11  Temp:    SpO2: 93% 93%    Last Pain:  Vitals:   03/22/23 0930  TempSrc:   PainSc: Asleep                 Collene Schlichter

## 2023-03-22 NOTE — Discharge Instructions (Signed)
[facility]  Discharge Instructions  AV Fistula or Graft Surgery for Dialysis Access  Please refer to the following instructions for your post-procedure care. Your surgeon or physician assistant will discuss any changes with you.  Activity  You may drive the day following your surgery, if you are comfortable and no longer taking prescription pain medication. Resume full activity as the soreness in your incision resolves.  Bathing/Showering  You may shower after you go home. Keep your incision dry for 48 hours. Do not soak in a bathtub, hot tub, or swim until the incision heals completely. You may not shower if you have a hemodialysis catheter.  Incision Care  Clean your incision with mild soap and water after 48 hours. Pat the area dry with a clean towel. You do not need a bandage unless otherwise instructed. Do not apply any ointments or creams to your incision. You may have skin glue on your incision. Do not peel it off. It will come off on its own in about one week. Your arm may swell a bit after surgery. To reduce swelling use pillows to elevate your arm so it is above your heart. Your doctor will tell you if you need to lightly wrap your arm with an ACE bandage.  Diet  Resume your normal diet. There are not special food restrictions following this procedure. In order to heal from your surgery, it is CRITICAL to get adequate nutrition. Your body requires vitamins, minerals, and protein. Vegetables are the best source of vitamins and minerals. Vegetables also provide the perfect balance of protein. Processed food has little nutritional value, so try to avoid this.  Medications  Resume taking all of your medications. If your incision is causing pain, you may take over-the counter pain relievers such as acetaminophen (Tylenol). If you were prescribed a stronger pain medication, please be aware these medications can cause nausea and constipation. Prevent  nausea by taking the medication with a snack or meal. Avoid constipation by drinking plenty of fluids and eating foods with high amount of fiber, such as fruits, vegetables, and grains.  Do not take Tylenol if you are taking prescription pain medications.  Follow up Your surgeon may want to see you in the office following your access surgery. If so, this will be arranged at the time of your surgery.  Please call us immediately for any of the following conditions:  Increased pain, redness, drainage (pus) from your incision site Fever of 101 degrees or higher Severe or worsening pain at your incision site Hand pain or numbness.  Reduce your risk of vascular disease:  Stop smoking. If you would like help, call [facility] at 1-800-QUIT-NOW (623-188-4107) or  at [phone]  Manage your cholesterol Maintain a desired weight Control your diabetes Keep your blood pressure down  Dialysis  It will take several weeks to several months for your new dialysis access to be ready for use. Your surgeon will determine when it is okay to use it. Your nephrologist will continue to direct your dialysis. You can continue to use your Permcath until your new access is ready for use.   03/22/2023 Larry Clements [id number] 1955/07/11  Surgeon(s): Maeola Harman, MD  Procedure(s): LEFT ARM ARTERIOVENOUS (AV) FISTULA CREATION   May stick graft immediately   May stick graft on designated area only:   X Do not stick left AV fistula for 12 weeks    If you have any questions, please call the office  at [phone].

## 2023-03-23 ENCOUNTER — Encounter (HOSPITAL_COMMUNITY): Payer: Self-pay | Admitting: Vascular Surgery

## 2023-04-04 ENCOUNTER — Telehealth: Payer: Self-pay

## 2023-04-04 NOTE — Telephone Encounter (Signed)
Pt's daughter walked into office, filled out triage form, form given to triage RN to call, left office.  Caller: Patient's daughter, Toni Amend  Concern: Larger than golf ball sized lump, appears to be fluid filled, no redness or pain  Location: near elbow at top of incision  Description:  progressively worsening since surgery  Procedure: Dialysis Access Surgery  Resolution: Appointment scheduled for first available triage appt to access  Next Appt: Appointment scheduled for 04/05/23 @ 0900

## 2023-04-05 ENCOUNTER — Ambulatory Visit: Payer: Medicare HMO

## 2023-04-05 ENCOUNTER — Other Ambulatory Visit (HOSPITAL_COMMUNITY): Payer: Self-pay | Admitting: Vascular Surgery

## 2023-04-05 ENCOUNTER — Ambulatory Visit: Payer: Medicare HMO | Admitting: Physician Assistant

## 2023-04-05 ENCOUNTER — Ambulatory Visit (HOSPITAL_COMMUNITY)
Admission: RE | Admit: 2023-04-05 | Discharge: 2023-04-05 | Disposition: A | Discharge status: Home / Self Care | Payer: Medicare HMO | Source: Ambulatory Visit | Attending: Vascular Surgery | Admitting: Vascular Surgery

## 2023-04-05 VITALS — BP 175/93 | HR 87 | Temp 98.6°F | Ht 76.0 in | Wt 235.6 lb

## 2023-04-05 DIAGNOSIS — L988 Other specified disorders of the skin and subcutaneous tissue: Secondary | ICD-10-CM | POA: Insufficient documentation

## 2023-04-05 DIAGNOSIS — N186 End stage renal disease: Secondary | ICD-10-CM

## 2023-04-05 NOTE — Progress Notes (Signed)
POST OPERATIVE OFFICE NOTE    CC:  F/u for surgery  HPI:  This is a 67 y.o. male who is s/p first stage basilic fistula creation on 03/22/23 by Dr. Randie Heinz.    Pt returns today for follow up.  Pt states he has had swelling over the incision for about 2 weeks and was worried.  He states he has no pain, or loss of motor or sensation in the left UE/hand.  There is no erythema or skin compromise, no signs of ischemia.    No Known Allergies  Current Outpatient Medications  Medication Sig Dispense Refill   amLODipine (NORVASC) 10 MG tablet TAKE 1 TABLET BY MOUTH EVERY DAY (Patient taking differently: Take 10 mg by mouth daily.) 90 tablet 3   calcitRIOL (ROCALTROL) 0.5 MCG capsule Take 0.5 mcg by mouth daily.     doxazosin (CARDURA) 4 MG tablet Take 6 mg by mouth at bedtime.     furosemide (LASIX) 40 MG tablet TAKE 1 TABLET (40 MG TOTAL) BY MOUTH DAILY. 90 tablet 0   hydrALAZINE (APRESOLINE) 100 MG tablet Take 100 mg by mouth 3 (three) times daily.     labetalol (NORMODYNE) 300 MG tablet Take 300 mg by mouth 3 (three) times daily.  5   sevelamer carbonate (RENVELA) 800 MG tablet Take 800 mg by mouth 3 (three) times daily with meals.     spironolactone (ALDACTONE) 25 MG tablet Take 25 mg by mouth at bedtime.     telmisartan (MICARDIS) 80 MG tablet Take 80 mg by mouth daily.     No current facility-administered medications for this visit.     ROS:  See HPI  Physical Exam:    Incision:  well healed Extremities:  palpable thrill, palpable radial pulse Neuro: sensation and motor intact     Findings:  +--------------------+----------+-----------------+--------+  AVF                PSV (cm/s)Flow Vol (mL/min)Comments  +--------------------+----------+-----------------+--------+  Native artery inflow   114           403                 +--------------------+----------+-----------------+--------+  AVF Anastomosis        156                                +--------------------+----------+-----------------+--------+     +------------+---------+-------------+---------+---------------------------  ----+  OUTFLOW VEIN   PSV   Diameter (cm)  Depth             Describe                            (cm/s)                 (cm)                                     +------------+---------+-------------+---------+---------------------------  ----+  Prox UA        72        0.62       1.76                                     +------------+---------+-------------+---------+---------------------------  ----+  Mid UA  704       0.46       0.58       partially-occlusive  and                                                            stenotic               +------------+---------+-------------+---------+---------------------------  ----+  Dist UA        73        0.52       0.20                                     +------------+---------+-------------+---------+---------------------------  ----+  AC Fossa       110       0.53       1.86                                     +------------+---------+-------------+---------+---------------------------  ----+   Summary:  Patent arteriovenous fistula.  Arteriovenous fistula-Elevated velocities noted.  Arteriovenous fistula-Thrombus noted in the in the mid-distal upper arm.   Left antecubital fossa complex cystic structure without internal  vascularity adjacent to AVF anastomosis site measuring approximatelty  4.4x2.9x3.7cm Sonographic findings are consistent with post surgical  hematoma.    Assessment/Plan:  This is a 67 y.o. male who is s/p: Left upper arm brachial artery to basilic vein AV fistula creation  There is goo flow vol in the fistula and the hematoma is not increasing in size.  His skin is not compromised.  No erythema or thinning.    -He will be scheduled for return duplex in Dec to check for maturity.  There is stenosis noted above the  anastomosis.  He will need a second stage transposition of the basilic vein fistula in the future pending the duplex.   He will exercise/use the left UE for daily activities.     Mosetta Pigeon PA-C [facility] [phone]   Clinic MD:  Karin Lieu

## 2023-04-10 ENCOUNTER — Other Ambulatory Visit: Payer: Self-pay

## 2023-04-10 DIAGNOSIS — N186 End stage renal disease: Secondary | ICD-10-CM

## 2023-04-14 DIAGNOSIS — Z992 Dependence on renal dialysis: Secondary | ICD-10-CM | POA: Diagnosis not present

## 2023-04-14 DIAGNOSIS — N186 End stage renal disease: Secondary | ICD-10-CM | POA: Diagnosis not present

## 2023-04-14 DIAGNOSIS — I129 Hypertensive chronic kidney disease with stage 1 through stage 4 chronic kidney disease, or unspecified chronic kidney disease: Secondary | ICD-10-CM | POA: Diagnosis not present

## 2023-05-02 ENCOUNTER — Ambulatory Visit (HOSPITAL_COMMUNITY): Admission: RE | Admit: 2023-05-02 | Payer: Medicare HMO | Source: Ambulatory Visit

## 2023-05-07 ENCOUNTER — Ambulatory Visit (HOSPITAL_COMMUNITY): Payer: Medicare HMO | Attending: Vascular Surgery

## 2023-05-12 ENCOUNTER — Emergency Department (HOSPITAL_COMMUNITY)
Admission: EM | Admit: 2023-05-12 | Discharge: 2023-05-12 | Discharge status: Against Medical Advice | Payer: Medicare HMO | Attending: Emergency Medicine | Admitting: Emergency Medicine

## 2023-05-12 ENCOUNTER — Other Ambulatory Visit: Payer: Self-pay

## 2023-05-12 ENCOUNTER — Encounter (HOSPITAL_COMMUNITY): Payer: Self-pay

## 2023-05-12 DIAGNOSIS — R799 Abnormal finding of blood chemistry, unspecified: Secondary | ICD-10-CM | POA: Diagnosis not present

## 2023-05-12 DIAGNOSIS — Z5321 Procedure and treatment not carried out due to patient leaving prior to being seen by health care provider: Secondary | ICD-10-CM | POA: Insufficient documentation

## 2023-05-12 DIAGNOSIS — Z992 Dependence on renal dialysis: Secondary | ICD-10-CM | POA: Insufficient documentation

## 2023-05-12 LAB — CBC WITH DIFFERENTIAL/PLATELET
Abs Immature Granulocytes: 0.01 10*3/uL (ref 0.00–0.07)
Basophils Absolute: 0 10*3/uL (ref 0.0–0.1)
Basophils Relative: 1 %
Eosinophils Absolute: 0.1 10*3/uL (ref 0.0–0.5)
Eosinophils Relative: 3 %
HCT: 21.7 % — ABNORMAL LOW (ref 39.0–52.0)
Hemoglobin: 7 g/dL — ABNORMAL LOW (ref 13.0–17.0)
Immature Granulocytes: 0 %
Lymphocytes Relative: 17 %
Lymphs Abs: 0.6 10*3/uL — ABNORMAL LOW (ref 0.7–4.0)
MCH: 29.4 pg (ref 26.0–34.0)
MCHC: 32.3 g/dL (ref 30.0–36.0)
MCV: 91.2 fL (ref 80.0–100.0)
Monocytes Absolute: 0.4 10*3/uL (ref 0.1–1.0)
Monocytes Relative: 11 %
Neutro Abs: 2.5 10*3/uL (ref 1.7–7.7)
Neutrophils Relative %: 68 %
Platelets: 181 10*3/uL (ref 150–400)
RBC: 2.38 MIL/uL — ABNORMAL LOW (ref 4.22–5.81)
RDW: 12.3 % (ref 11.5–15.5)
WBC: 3.6 10*3/uL — ABNORMAL LOW (ref 4.0–10.5)
nRBC: 0 % (ref 0.0–0.2)

## 2023-05-12 LAB — COMPREHENSIVE METABOLIC PANEL
ALT: 16 U/L (ref 0–44)
AST: 18 U/L (ref 15–41)
Albumin: 3.8 g/dL (ref 3.5–5.0)
Alkaline Phosphatase: 76 U/L (ref 38–126)
Anion gap: 11 (ref 5–15)
BUN: 40 mg/dL — ABNORMAL HIGH (ref 8–23)
CO2: 29 mmol/L (ref 22–32)
Calcium: 10 mg/dL (ref 8.9–10.3)
Chloride: 98 mmol/L (ref 98–111)
Creatinine, Ser: 9.8 mg/dL — ABNORMAL HIGH (ref 0.61–1.24)
GFR, Estimated: 5 mL/min — ABNORMAL LOW (ref >60)
Glucose, Bld: 106 mg/dL — ABNORMAL HIGH (ref 70–99)
Potassium: 4.1 mmol/L (ref 3.5–5.1)
Sodium: 138 mmol/L (ref 135–145)
Total Bilirubin: 0.4 mg/dL (ref <1.2)
Total Protein: 7.1 g/dL (ref 6.5–8.1)

## 2023-05-12 LAB — URINALYSIS, W/ REFLEX TO CULTURE (INFECTION SUSPECTED)
Bacteria, UA: NONE SEEN
Bilirubin Urine: NEGATIVE
Glucose, UA: 150 mg/dL — AB
Hgb urine dipstick: NEGATIVE
Ketones, ur: NEGATIVE mg/dL
Leukocytes,Ua: NEGATIVE
Nitrite: NEGATIVE
Protein, ur: >=300 mg/dL — AB
Specific Gravity, Urine: 1.012 (ref 1.005–1.030)
pH: 8 (ref 5.0–8.0)

## 2023-05-12 LAB — TYPE AND SCREEN
ABO/RH(D): A POS
Antibody Screen: NEGATIVE

## 2023-05-12 NOTE — ED Notes (Signed)
 Pt decided to leave before going to a room.

## 2023-05-12 NOTE — ED Provider Triage Note (Signed)
Emergency Medicine Provider Triage Evaluation Note  Larry Clements , a 67 y.o. male  was evaluated in triage.  Pt complains of hgb 6.4.  Review of Systems  Positive:  Negative:   Physical Exam  BP 139/87 (BP Location: Right Arm)   Pulse 84   Temp 98.8 F (37.1 C) (Oral)   Resp 18   Ht 6\' 4"  (1.93 m)   Wt 106.6 kg   SpO2 100%   BMI 28.61 kg/m  Gen:   Awake, no distress   Resp:  Normal effort  MSK:   Moves extremities without difficulty  Other:    Medical Decision Making  Medically screening exam initiated at 5:09 PM.  Appropriate orders placed.  Larry Clements was informed that the remainder of the evaluation will be completed by another provider, this initial triage assessment does not replace that evaluation, and the importance of remaining in the ED until their evaluation is complete.  Hgb 6.4 yesterday at dialysis. Denies dizziness. States that he feels great. Denies hematochezia, hematemesis, blood thinners.    Larry Clements, [place] 05/12/23 1711

## 2023-05-12 NOTE — ED Triage Notes (Addendum)
Pt reports his doctor called him today and told him to come to ER because his hgb was 6.4. His labs were drawn yesterday at dialysis. Dialysis MWF, last dialyzed Friday. He has no complaints, states "I feel great." Denies blood in stools.

## 2023-05-15 DIAGNOSIS — Z992 Dependence on renal dialysis: Secondary | ICD-10-CM | POA: Diagnosis not present

## 2023-05-15 DIAGNOSIS — I129 Hypertensive chronic kidney disease with stage 1 through stage 4 chronic kidney disease, or unspecified chronic kidney disease: Secondary | ICD-10-CM | POA: Diagnosis not present

## 2023-05-15 DIAGNOSIS — N186 End stage renal disease: Secondary | ICD-10-CM | POA: Diagnosis not present

## 2023-06-15 DIAGNOSIS — Z992 Dependence on renal dialysis: Secondary | ICD-10-CM | POA: Diagnosis not present

## 2023-06-15 DIAGNOSIS — I129 Hypertensive chronic kidney disease with stage 1 through stage 4 chronic kidney disease, or unspecified chronic kidney disease: Secondary | ICD-10-CM | POA: Diagnosis not present

## 2023-06-15 DIAGNOSIS — N186 End stage renal disease: Secondary | ICD-10-CM | POA: Diagnosis not present

## 2023-07-13 DIAGNOSIS — Z992 Dependence on renal dialysis: Secondary | ICD-10-CM | POA: Diagnosis not present

## 2023-07-13 DIAGNOSIS — N186 End stage renal disease: Secondary | ICD-10-CM | POA: Diagnosis not present

## 2023-07-13 DIAGNOSIS — I129 Hypertensive chronic kidney disease with stage 1 through stage 4 chronic kidney disease, or unspecified chronic kidney disease: Secondary | ICD-10-CM | POA: Diagnosis not present

## 2023-08-13 DIAGNOSIS — N186 End stage renal disease: Secondary | ICD-10-CM | POA: Diagnosis not present

## 2023-08-13 DIAGNOSIS — I129 Hypertensive chronic kidney disease with stage 1 through stage 4 chronic kidney disease, or unspecified chronic kidney disease: Secondary | ICD-10-CM | POA: Diagnosis not present

## 2023-08-13 DIAGNOSIS — Z992 Dependence on renal dialysis: Secondary | ICD-10-CM | POA: Diagnosis not present

## 2023-09-12 DIAGNOSIS — Z992 Dependence on renal dialysis: Secondary | ICD-10-CM | POA: Diagnosis not present

## 2023-09-12 DIAGNOSIS — I129 Hypertensive chronic kidney disease with stage 1 through stage 4 chronic kidney disease, or unspecified chronic kidney disease: Secondary | ICD-10-CM | POA: Diagnosis not present

## 2023-09-12 DIAGNOSIS — N186 End stage renal disease: Secondary | ICD-10-CM | POA: Diagnosis not present

## 2023-10-13 DIAGNOSIS — N186 End stage renal disease: Secondary | ICD-10-CM | POA: Diagnosis not present

## 2023-10-13 DIAGNOSIS — Z992 Dependence on renal dialysis: Secondary | ICD-10-CM | POA: Diagnosis not present

## 2023-10-13 DIAGNOSIS — I129 Hypertensive chronic kidney disease with stage 1 through stage 4 chronic kidney disease, or unspecified chronic kidney disease: Secondary | ICD-10-CM | POA: Diagnosis not present

## 2023-10-26 ENCOUNTER — Ambulatory Visit (HOSPITAL_COMMUNITY): Admission: RE | Admit: 2023-10-26 | Source: Home / Self Care | Admitting: Vascular Surgery

## 2023-10-26 ENCOUNTER — Encounter (HOSPITAL_COMMUNITY): Admission: RE | Payer: Self-pay | Source: Home / Self Care

## 2023-10-26 SURGERY — A/V SHUNT INTERVENTION
Anesthesia: LOCAL

## 2023-11-12 DIAGNOSIS — N186 End stage renal disease: Secondary | ICD-10-CM | POA: Diagnosis not present

## 2023-11-12 DIAGNOSIS — Z992 Dependence on renal dialysis: Secondary | ICD-10-CM | POA: Diagnosis not present

## 2023-11-12 DIAGNOSIS — I129 Hypertensive chronic kidney disease with stage 1 through stage 4 chronic kidney disease, or unspecified chronic kidney disease: Secondary | ICD-10-CM | POA: Diagnosis not present

## 2023-11-13 ENCOUNTER — Ambulatory Visit

## 2023-11-13 ENCOUNTER — Encounter: Payer: Self-pay | Admitting: Physician Assistant

## 2023-11-13 ENCOUNTER — Ambulatory Visit
Admission: RE | Admit: 2023-11-13 | Discharge: 2023-11-13 | Disposition: A | Discharge status: Home / Self Care | Source: Ambulatory Visit | Attending: Vascular Surgery | Admitting: Vascular Surgery

## 2023-11-13 VITALS — BP 146/88 | HR 82 | Temp 97.8°F | Resp 18 | Ht 76.0 in | Wt 248.3 lb

## 2023-11-13 DIAGNOSIS — N186 End stage renal disease: Secondary | ICD-10-CM

## 2023-11-13 NOTE — Progress Notes (Signed)
 Established Dialysis Access   History of Present Illness   Larry Clements is a 68 y.o. (1955-09-18) male who presents for  follow up of his dialysis access. s/p first stage basilic fistula creation on 03/22/23 by Dr. Sheree.  He was last seen in November for his post op follow up and his fistula was not fully matured. He has not returned for follow up evaluation since.   Today he is here with duplex. He reports he wanted to make sure his left arm was fully healed before it being used. He reports no pain, numbness or weakness. He dialyzes on MWF at the [facility] location. Currently dialyzing via a right internal jugular TDC.     Current Outpatient Medications  Medication Sig Dispense Refill   amLODipine  (NORVASC ) 10 MG tablet TAKE 1 TABLET BY MOUTH EVERY DAY 90 tablet 3   calcitRIOL  (ROCALTROL ) 0.5 MCG capsule Take 0.5 mcg by mouth daily.     doxazosin  (CARDURA ) 4 MG tablet Take 6 mg by mouth at bedtime.     hydrALAZINE  (APRESOLINE ) 100 MG tablet Take 100 mg by mouth 3 (three) times daily.     labetalol  (NORMODYNE ) 300 MG tablet Take 300 mg by mouth 3 (three) times daily.  5   sevelamer carbonate (RENVELA) 800 MG tablet Take 800 mg by mouth 3 (three) times daily with meals.     spironolactone (ALDACTONE) 25 MG tablet Take 25 mg by mouth at bedtime.     telmisartan (MICARDIS) 80 MG tablet Take 80 mg by mouth daily.     furosemide  (LASIX ) 40 MG tablet TAKE 1 TABLET (40 MG TOTAL) BY MOUTH DAILY. (Patient not taking: Reported on 11/13/2023) 90 tablet 0   No current facility-administered medications for this visit.    On ROS today: negative unless stated in HPI   Physical Examination   Vitals:   11/13/23 1431  BP: (!) 146/88  Pulse: 82  Resp: 18  Temp: 97.8 F (36.6 C)  TempSrc: Temporal  SpO2: 92%  Weight: 248 lb 4.8 oz (112.6 kg)  Height: 6' 4 (1.93 m)   Body mass index is 30.22 kg/m.  General WDWN, in no distress  Pulmonary Non labored  Cardiac regular  Vascular  2+ left radial pulse, hand warm and well perfused, fistula with palpable thrill, deep in left upper arm   Musculo- skeletal M/S 5/5 throughout  , Extremities without ischemic changes    Neurologic A&O; CN grossly intact     Non-invasive Vascular Imaging   left Arm Access Duplex  (11/13/23):  Findings:  +--------------------+----------+-----------------+--------+  AVF                PSV (cm/s)Flow Vol (mL/min)Comments  +--------------------+----------+-----------------+--------+  Native artery inflow   119           829                 +--------------------+----------+-----------------+--------+  AVF Anastomosis        120                               +--------------------+----------+-----------------+--------+     +------------+----------+-------------+----------+-------------------------  ----+  OUTFLOW VEINPSV (cm/s)Diameter (cm)Depth (cm)          Describe              +------------+----------+-------------+----------+-------------------------  ----+  Prox UA        137  0.65        1.59                                   +------------+----------+-------------+----------+-------------------------  ----+  Mid UA         115        0.61        1.02                                   +------------+----------+-------------+----------+-------------------------  ----+  Dist UA        1125       0.72        0.76       competing branch and                                                          partially-occlusive        +------------+----------+-------------+----------+-------------------------  ----+  AC Fossa        15        0.59        0.15                                   +------------+----------+-------------+----------+-------------------------  ----+  Summary: 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  One small competing branch in the distal upper arm.   Medical Decision Making   Dreshawn Hendershott is a 68 y.o. male who presents for follow up of his left AV fistula. s/p first stage basilic fistula creation on 03/22/23 by Dr. Sheree. Fistula is well healed. He is without any signs or symptoms of steal.  Duplex shows patent left AV fistula. Fistula has matured well but is deep in left upper arm Recommend left 2nd stage basilic vein fistula transposition. Patient is hesitant but agreeable to proceed understanding the disadvantages of continuing to use a TDC long term and inability to use his fistula as is He dialyzes on MWF  He is not on any blood thinners We will arrange for his left 2nd stage basilic vein transposition in the near future with Dr. Sheree on a non dialysis day   Teretha Damme, PA-C [facility] Office: [phone]  Clinic MD:  Magda

## 2023-11-14 ENCOUNTER — Telehealth: Payer: Self-pay

## 2023-11-14 NOTE — Telephone Encounter (Signed)
 Attempted to call for surgery scheduling. LVM

## 2023-11-15 ENCOUNTER — Ambulatory Visit (HOSPITAL_COMMUNITY): Admit: 2023-11-15 | Admitting: Vascular Surgery

## 2023-11-15 ENCOUNTER — Encounter (HOSPITAL_COMMUNITY): Payer: Self-pay

## 2023-11-15 SURGERY — A/V SHUNT INTERVENTION
Anesthesia: LOCAL | Site: Arm Upper | Laterality: Left

## 2023-11-20 ENCOUNTER — Telehealth: Payer: Self-pay

## 2023-11-20 NOTE — Telephone Encounter (Signed)
 Called pt's daughter Charmaine to schedule 2nd stage BVT. Charmaine said pt was hesitant to proceed after how sick he felt last surgery with general anesthesia. Per MD, the 2nd stage BVT can be scheduled with MAC/Peripheral Nerve Block. I called Charmaine back to explain this to her and they are in agreement to proceed but she wants to discuss dates with pt and then will call us  back to schedule.

## 2023-11-27 ENCOUNTER — Telehealth: Payer: Self-pay

## 2023-11-27 NOTE — Telephone Encounter (Signed)
 Called pt's daughter to schedule his 2nd stage BVT. She stated pt does not know when he wants to schedule it for but not for at least 2-3 months. She is going to reach out to us  when pt is ready to schedule.

## 2023-12-13 DIAGNOSIS — N186 End stage renal disease: Secondary | ICD-10-CM | POA: Diagnosis not present

## 2023-12-13 DIAGNOSIS — Z992 Dependence on renal dialysis: Secondary | ICD-10-CM | POA: Diagnosis not present

## 2023-12-13 DIAGNOSIS — I129 Hypertensive chronic kidney disease with stage 1 through stage 4 chronic kidney disease, or unspecified chronic kidney disease: Secondary | ICD-10-CM | POA: Diagnosis not present

## 2024-01-13 DIAGNOSIS — Z992 Dependence on renal dialysis: Secondary | ICD-10-CM | POA: Diagnosis not present

## 2024-01-13 DIAGNOSIS — I129 Hypertensive chronic kidney disease with stage 1 through stage 4 chronic kidney disease, or unspecified chronic kidney disease: Secondary | ICD-10-CM | POA: Diagnosis not present

## 2024-01-13 DIAGNOSIS — N186 End stage renal disease: Secondary | ICD-10-CM | POA: Diagnosis not present

## 2024-02-08 ENCOUNTER — Other Ambulatory Visit: Payer: Self-pay | Admitting: Internal Medicine

## 2024-02-08 NOTE — Telephone Encounter (Signed)
 Copied from CRM #[id number]. Topic: Clinical - Medication Refill >> Feb 08, 2024 12:40 PM Shardie S wrote: Medication: amLODipine  (NORVASC ) 10 MG tablet  Has the patient contacted their pharmacy? Yes, contact PCP  (Agent: If no, request that the patient contact the pharmacy for the refill. If patient does not wish to contact the pharmacy document the reason why and proceed with request.) (Agent: If yes, when and what did the pharmacy advise?)  This is the patient's preferred pharmacy:  [id number] [address], [place] - [address] [place] [address] Phone: [phone] Fax: [phone]   Is this the correct pharmacy for this prescription? Yes If no, delete pharmacy and type the correct one.   Has the prescription been filled recently? No  Is the patient out of the medication? Yes  Has the patient been seen for an appointment in the last year OR does the patient have an upcoming appointment? no  Can we respond through MyChart? No  Agent: Please be advised that Rx refills may take up to 3 business days. We ask that you follow-up with your pharmacy.

## 2024-02-11 MED ORDER — AMLODIPINE BESYLATE 10 MG PO TABS: 10.0000 mg | ORAL_TABLET | Freq: Every day | ORAL | 0 refills | Status: AC

## 2024-02-11 NOTE — Telephone Encounter (Signed)
 Received refill request from pharmacy, we have not filled since 01/30/2019. Patient last seen in office 03/17/2022.

## 2024-02-12 DIAGNOSIS — I129 Hypertensive chronic kidney disease with stage 1 through stage 4 chronic kidney disease, or unspecified chronic kidney disease: Secondary | ICD-10-CM | POA: Diagnosis not present

## 2024-02-12 DIAGNOSIS — N186 End stage renal disease: Secondary | ICD-10-CM | POA: Diagnosis not present

## 2024-02-12 DIAGNOSIS — Z992 Dependence on renal dialysis: Secondary | ICD-10-CM | POA: Diagnosis not present

## 2024-03-06 DIAGNOSIS — M67432 Ganglion, left wrist: Secondary | ICD-10-CM | POA: Diagnosis not present

## 2024-03-06 DIAGNOSIS — M67431 Ganglion, right wrist: Secondary | ICD-10-CM | POA: Diagnosis not present

## 2024-03-06 DIAGNOSIS — M25531 Pain in right wrist: Secondary | ICD-10-CM | POA: Diagnosis not present

## 2024-03-06 DIAGNOSIS — M25532 Pain in left wrist: Secondary | ICD-10-CM | POA: Diagnosis not present

## 2024-03-14 DIAGNOSIS — N186 End stage renal disease: Secondary | ICD-10-CM | POA: Diagnosis not present

## 2024-03-14 DIAGNOSIS — Z992 Dependence on renal dialysis: Secondary | ICD-10-CM | POA: Diagnosis not present

## 2024-03-14 DIAGNOSIS — I129 Hypertensive chronic kidney disease with stage 1 through stage 4 chronic kidney disease, or unspecified chronic kidney disease: Secondary | ICD-10-CM | POA: Diagnosis not present

## 2024-04-08 DIAGNOSIS — M67431 Ganglion, right wrist: Secondary | ICD-10-CM | POA: Diagnosis not present

## 2024-04-13 DIAGNOSIS — I129 Hypertensive chronic kidney disease with stage 1 through stage 4 chronic kidney disease, or unspecified chronic kidney disease: Secondary | ICD-10-CM | POA: Diagnosis not present

## 2024-04-13 DIAGNOSIS — Z992 Dependence on renal dialysis: Secondary | ICD-10-CM | POA: Diagnosis not present

## 2024-04-13 DIAGNOSIS — N186 End stage renal disease: Secondary | ICD-10-CM | POA: Diagnosis not present

## 2024-04-18 DIAGNOSIS — M67431 Ganglion, right wrist: Secondary | ICD-10-CM | POA: Diagnosis not present

## 2024-05-23 ENCOUNTER — Encounter (HOSPITAL_COMMUNITY): Payer: Self-pay

## 2024-05-29 ENCOUNTER — Encounter (HOSPITAL_COMMUNITY): Payer: Self-pay

## 2024-06-10 ENCOUNTER — Encounter (HOSPITAL_COMMUNITY): Payer: Self-pay

## 2024-06-12 ENCOUNTER — Ambulatory Visit (HOSPITAL_COMMUNITY): Admission: RE | Admit: 2024-06-12 | Source: Home / Self Care | Admitting: Vascular Surgery

## 2024-06-12 ENCOUNTER — Encounter (HOSPITAL_COMMUNITY): Admission: RE | Payer: Self-pay | Source: Home / Self Care

## 2024-06-20 ENCOUNTER — Encounter (HOSPITAL_COMMUNITY): Payer: Self-pay | Admitting: Surgery

## 2024-08-01 ENCOUNTER — Ambulatory Visit (HOSPITAL_COMMUNITY)

## 2024-08-01 ENCOUNTER — Ambulatory Visit
